# Patient Record
Sex: Male | Born: 1995
Health system: Southern US, Community
[De-identification: ages and names within clinical notes are randomized; demographics above are authoritative.]

## PROBLEM LIST (undated history)

## (undated) ENCOUNTER — Emergency Department (HOSPITAL_COMMUNITY): Admission: EM | Payer: Managed Care, Other (non HMO) | Source: Home / Self Care

## (undated) DIAGNOSIS — C6290 Malignant neoplasm of unspecified testis, unspecified whether descended or undescended: Secondary | ICD-10-CM

## (undated) DIAGNOSIS — C801 Malignant (primary) neoplasm, unspecified: Secondary | ICD-10-CM

## (undated) DIAGNOSIS — Z95828 Presence of other vascular implants and grafts: Secondary | ICD-10-CM

## (undated) DIAGNOSIS — Z800 Family history of malignant neoplasm of digestive organs: Secondary | ICD-10-CM

## (undated) DIAGNOSIS — I10 Essential (primary) hypertension: Secondary | ICD-10-CM

## (undated) DIAGNOSIS — I82409 Acute embolism and thrombosis of unspecified deep veins of unspecified lower extremity: Secondary | ICD-10-CM

## (undated) DIAGNOSIS — C7802 Secondary malignant neoplasm of left lung: Secondary | ICD-10-CM

## (undated) DIAGNOSIS — N5082 Scrotal pain: Secondary | ICD-10-CM

## (undated) DIAGNOSIS — D649 Anemia, unspecified: Secondary | ICD-10-CM

## (undated) DIAGNOSIS — C6292 Malignant neoplasm of left testis, unspecified whether descended or undescended: Secondary | ICD-10-CM

## (undated) DIAGNOSIS — Z803 Family history of malignant neoplasm of breast: Secondary | ICD-10-CM

## (undated) DIAGNOSIS — R011 Cardiac murmur, unspecified: Secondary | ICD-10-CM

## (undated) HISTORY — PX: ORCHIECTOMY: SHX2116

## (undated) HISTORY — DX: Family history of malignant neoplasm of digestive organs: Z80.0

## (undated) HISTORY — DX: Malignant neoplasm of left testis, unspecified whether descended or undescended: C62.92

## (undated) HISTORY — PX: NO PAST SURGERIES: SHX2092

## (undated) HISTORY — DX: Family history of malignant neoplasm of breast: Z80.3

## (undated) HISTORY — DX: Secondary malignant neoplasm of left lung: C78.02

## (undated) HISTORY — DX: Malignant (primary) neoplasm, unspecified: C80.1

---

## 2015-01-01 DIAGNOSIS — M25512 Pain in left shoulder: Secondary | ICD-10-CM | POA: Insufficient documentation

## 2015-01-01 DIAGNOSIS — S46912A Strain of unspecified muscle, fascia and tendon at shoulder and upper arm level, left arm, initial encounter: Secondary | ICD-10-CM | POA: Insufficient documentation

## 2017-07-01 ENCOUNTER — Emergency Department (HOSPITAL_BASED_OUTPATIENT_CLINIC_OR_DEPARTMENT_OTHER): Payer: Managed Care, Other (non HMO)

## 2017-07-01 ENCOUNTER — Emergency Department (HOSPITAL_BASED_OUTPATIENT_CLINIC_OR_DEPARTMENT_OTHER)
Admission: EM | Admit: 2017-07-01 | Discharge: 2017-07-01 | Disposition: A | Discharge status: Home / Self Care | Payer: Managed Care, Other (non HMO) | Attending: Physician Assistant | Admitting: Physician Assistant

## 2017-07-01 ENCOUNTER — Other Ambulatory Visit: Payer: Self-pay

## 2017-07-01 ENCOUNTER — Encounter (HOSPITAL_BASED_OUTPATIENT_CLINIC_OR_DEPARTMENT_OTHER): Payer: Self-pay | Admitting: Emergency Medicine

## 2017-07-01 ENCOUNTER — Encounter (HOSPITAL_BASED_OUTPATIENT_CLINIC_OR_DEPARTMENT_OTHER): Payer: Self-pay | Admitting: *Deleted

## 2017-07-01 ENCOUNTER — Other Ambulatory Visit: Payer: Self-pay | Admitting: Urology

## 2017-07-01 DIAGNOSIS — Y33XXXA Other specified events, undetermined intent, initial encounter: Secondary | ICD-10-CM | POA: Insufficient documentation

## 2017-07-01 DIAGNOSIS — Y939 Activity, unspecified: Secondary | ICD-10-CM | POA: Diagnosis not present

## 2017-07-01 DIAGNOSIS — Y929 Unspecified place or not applicable: Secondary | ICD-10-CM | POA: Diagnosis not present

## 2017-07-01 DIAGNOSIS — Y999 Unspecified external cause status: Secondary | ICD-10-CM | POA: Diagnosis not present

## 2017-07-01 DIAGNOSIS — S3994XA Unspecified injury of external genitals, initial encounter: Secondary | ICD-10-CM | POA: Diagnosis present

## 2017-07-01 DIAGNOSIS — S3022XA Contusion of scrotum and testes, initial encounter: Secondary | ICD-10-CM | POA: Diagnosis not present

## 2017-07-01 LAB — URINALYSIS, ROUTINE W REFLEX MICROSCOPIC
Bilirubin Urine: NEGATIVE
GLUCOSE, UA: NEGATIVE mg/dL
HGB URINE DIPSTICK: NEGATIVE
Ketones, ur: NEGATIVE mg/dL
LEUKOCYTES UA: NEGATIVE
Nitrite: NEGATIVE
PH: 6 (ref 5.0–8.0)
Protein, ur: NEGATIVE mg/dL
Specific Gravity, Urine: 1.025 (ref 1.005–1.030)

## 2017-07-01 MED ORDER — OXYCODONE-ACETAMINOPHEN 5-325 MG PO TABS
1.0000 | ORAL_TABLET | Freq: Once | ORAL | Status: AC
  Administered 2017-07-01: 1 via ORAL
  Filled 2017-07-01: qty 1

## 2017-07-01 MED ORDER — OXYCODONE-ACETAMINOPHEN 5-325 MG PO TABS: 1.0000 | ORAL_TABLET | Freq: Four times a day (QID) | ORAL | 0 refills | Status: DC | PRN

## 2017-07-01 MED FILL — OXYCODONE-ACETAMINOPHEN 5-3: 5-325 | 3 days supply | Qty: 5 | Fill #0

## 2017-07-01 NOTE — Discharge Instructions (Signed)
You have a hematoma that was found.  [facility] reports that they will call you in the next 24 hours to schedule an appointment for check in/procedure.  Please contact them if you are not contacted.  We have given you some medication to help with discomfort until they are able to see you.

## 2017-07-01 NOTE — ED Triage Notes (Signed)
L testicle pain and swelling x 2 weeks. Pt seen by urology Monday and was now has appt for [place] but states pain is getting worse.

## 2017-07-01 NOTE — Progress Notes (Signed)
SPOKE W/ PT VIA PHONE FOR PRE-OP INTERVIEW.  NPO AFTER MN.  ARRIVE AT 3552.  NEEDS HG.  MAY TAKE OXYCODONE IF NEEDED AM ODS W/ SIPS OF WATER.

## 2017-07-01 NOTE — ED Notes (Signed)
Patient transported to Ultrasound 

## 2017-07-01 NOTE — ED Provider Notes (Signed)
[facility] EMERGENCY DEPARTMENT Provider Note   CSN: [id number] Arrival date & time: 07/01/17  4401     History   Chief Complaint Chief Complaint  Patient presents with  . Testicle Pain    HPI Daniel Shepherd is a 22 y.o. male.  HPI   Patient is a 22 year old male presenting with left testicular pain.  Patient was kicked in a kickboxing tournament in September.  He initially had pain had an ultrasound which was normal and sent home from the emergency department.  Patient since been developing increased pain in the left hand side.  Went to urologist on Monday.  He was scheduled for a ultrasound on the 29th of this month and promised that likely at that time that he would have a procedure done to relieve the pain.  Pain jhas noted patient induration and hardness to his left testicle testicle.  It is unchanged from Monday but he is concerned so came here for further evaluation.  She has no difficulty urination.  No fevers.  History reviewed. No pertinent past medical history.  There are no active problems to display for this patient.   History reviewed. No pertinent surgical history.     Home Medications    Prior to Admission medications   Not on File    Family History No family history on file.  Social History Social History   Tobacco Use  . Smoking status: Never Smoker  . Smokeless tobacco: Never Used  Substance Use Topics  . Alcohol use: Yes  . Drug use: No     Allergies   Patient has no known allergies.   Review of Systems Review of Systems  Constitutional: Negative for activity change.  Respiratory: Negative for shortness of breath.   Cardiovascular: Negative for chest pain.  Gastrointestinal: Negative for abdominal pain.  Genitourinary: Positive for testicular pain.  All other systems reviewed and are negative.    Physical Exam Updated Vital Signs BP (!) 164/105 (BP Location: Right Arm)   Pulse 64   Temp 98.1 F (36.7 C) (Oral)    Resp 20   Ht 6\' 1"  (1.854 m)   Wt 83.9 kg (185 lb)   SpO2 99%   BMI 24.41 kg/m   Physical Exam  Constitutional: He is oriented to person, place, and time. He appears well-nourished.  HENT:  Head: Normocephalic.  Eyes: Conjunctivae are normal.  Cardiovascular: Normal rate and regular rhythm.  No murmur heard. Pulmonary/Chest: Effort normal and breath sounds normal. No respiratory distress.  Genitourinary:  Genitourinary Comments: Left testicle swollen, hard.  Neurological: He is oriented to person, place, and time.  Skin: Skin is warm and dry. He is not diaphoretic.  Psychiatric: He has a normal mood and affect. His behavior is normal.     ED Treatments / Results  Labs (all labs ordered are listed, but only abnormal results are displayed) Labs Reviewed  URINALYSIS, ROUTINE W REFLEX MICROSCOPIC    EKG  EKG Interpretation None       Radiology No results found.  Procedures Procedures (including critical care time)  Medications Ordered in ED Medications - No data to display   Initial Impression / Assessment and Plan / ED Course  I have reviewed the triage vital signs and the nursing notes.  Pertinent labs & imaging results that were available during my care of the patient were reviewed by me and considered in my medical decision making (see chart for details).      Patient is a 22 year old  male presenting with left testicular pain.  Patient was kicked in a kickboxing tournament in September.  He initially had pain had an ultrasound which was normal and sent home from the emergency department.  Patient since been developing increased pain in the left hand side.  Went to urologist on Monday.  He was scheduled for a ultrasound on the 29th of this month and promised that likely at that time that he would have a procedure done to relieve the pain.  Pain jhas noted patient induration and hardness to his left testicle testicle.  It is unchanged from Monday but he is  concerned so came here for further evaluation.  10:09 AM Ultrasound is reassuring.  She has the same thing that it did prior.  This since patient reports that no actual change in the appearance and feeling that this is just becoming increasingly uncomfortable.  Discussed with urologist on call.  He reports that [facility] will call the patient and schedule appointment in the next 24 hours.  Either this afternoon or tomorrow.  Final Clinical Impressions(s) / ED Diagnoses   Final diagnoses:  None    ED Discharge Orders    None       Mackuen, Fredia Sorrow, MD 07/01/17 1010

## 2017-07-02 NOTE — H&P (Signed)
HPI: Daniel Shepherd is a 22 year-old male with a tense, painful left hydrocele.  He first noticed the pain approximately 02/14/2017. The pain is on the left side. He does have a history of scrotal trauma.   The pain is constant. The pain is dull. The intensity of his pain is rated as a 10. Walking, sitting , and lifting makes the pain worse. Patient denies exiting the car, coughing, standing, and twisting. Not moving makes the pain better. He was treated with the following pain medication(s): Ibuprofen.   He has had a scrotal ultrasound for the pain. He has not had blood in his urine recently. He has not had kidney stones. He has not had a urinary tract infection recently.   06/29/17: In 9/18 he was in a kick boxing match and got hit in the groin. He was able to continue on with his match but then developed pain about 48 hr later. He went to the emergency room and he reports a ultrasound was performed with no abnormality having been noted. Now he notes that he has begun to have pain again and it is on the left-hand side and is associated with swelling.  07/01/2017: Worsened pain over the past day or 2.He went to [facility] where repeat ultrasound was performed revealing abnormalities in the lower pole of the left testicle as well as a hydrocele. They called me, as he had increasing pain for advice. He has worked in here for recheck. He has had no fever, chills, no redness on the scrotum.   ALLERGIES: None   MEDICATIONS: Ibuprofen 800 mg tablet 1 tablet PO Daily     GU PSH: None   NON-GU PSH: Inner Ear Surgery Procedure - about 2013    GU PMH: None   NON-GU PMH: None   FAMILY HISTORY: None    Notes: Mother still living  Father still living    SOCIAL HISTORY: Marital Status: Married Preferred Language: English; Ethnicity: Not Hispanic Or Latino; Race: White Current Smoking Status: Patient has never smoked.   Tobacco Use Assessment Completed: Used Tobacco in last 30 days? Does  drink.  Drinks 2 caffeinated drinks per day.    REVIEW OF SYSTEMS:    GU Review Male:   Patient denies frequent urination, hard to postpone urination, burning/ pain with urination, get up at night to urinate, leakage of urine, stream starts and stops, trouble starting your stream, have to strain to urinate , erection problems, and penile pain.  Gastrointestinal (Upper):   Patient reports nausea and vomiting. Patient denies indigestion/ heartburn.  Gastrointestinal (Lower):   Patient reports constipation. Patient denies diarrhea.  Constitutional:   Patient denies weight loss, fever, fatigue, and night sweats.  Skin:   Patient denies skin rash/ lesion and itching.  Eyes:   Patient denies blurred vision and double vision.  Ears/ Nose/ Throat:   Patient denies sore throat and sinus problems.  Hematologic/Lymphatic:   Patient denies swollen glands and easy bruising.  Cardiovascular:   Patient denies leg swelling and chest pains.  Respiratory:   Patient denies cough and shortness of breath.  Endocrine:   Patient denies excessive thirst.  Musculoskeletal:   Patient reports back pain and joint pain.   Neurological:   Patient denies headaches and dizziness.  Psychologic:   Patient denies depression and anxiety.   VITAL SIGNS:    Weight 185 lb / 83.91 kg  Height 73 in / 185.42 cm  BP 140/95 mmHg  Pulse 98 /min  BMI 24.4 kg/m   GU PHYSICAL EXAMINATION:    Scrotum: No lesions. No edema. No cysts. No warts.  Epididymides: Right: no spermatocele, no masses, no cysts, no tenderness, no induration, no enlargement. Left: no spermatocele, no masses, no cysts, no tenderness, no induration, no enlargement.  Testes: 5+ cm hydrocele left testis. The fluid in the left testicle does transilluminate. No tenderness, no swelling, no enlargement left testis. No tenderness, no swelling, no enlargement right testis. Normal location left testis. Normal location right testis. No mass, no cyst, no varicocele left  testis. No mass, no cyst, no varicocele, no hydrocele right testis.   Urethral Meatus: Normal size. No lesion, no wart, no discharge, no polyp. Normal location.  Penis: Circumcised, no warts, no cracks. No dorsal Peyronie's plaques, no left corporal Peyronie's plaques, no right corporal Peyronie's plaques, no scarring, no warts. No balanitis, no meatal stenosis.   MULTI-SYSTEM PHYSICAL EXAMINATION:    Constitutional: Well-nourished. No physical deformities. Normally developed. Good grooming.  Neck: Neck symmetrical, not swollen. Normal tracheal position.  Respiratory: No labored breathing, no use of accessory muscles.   Cardiovascular: Normal temperature, normal extremity pulses, no swelling, no varicosities.  Lymphatic: No enlargement of neck, axillae, groin.  Skin: No paleness, no jaundice, no cyanosis. No lesion, no ulcer, no rash.  Neurologic / Psychiatric: Oriented to time, oriented to place, oriented to person. No depression, no anxiety, no agitation.  Gastrointestinal: No mass, no tenderness, no rigidity, non obese abdomen.  Eyes: Normal conjunctivae. Normal eyelids.  Ears, Nose, Mouth, and Throat: Left ear no scars, no lesions, no masses. Right ear no scars, no lesions, no masses. Nose no scars, no lesions, no masses. Normal hearing. Normal lips.  Musculoskeletal: Normal gait and station of head and neck.     PAST DATA REVIEWED:  Source Of History:  Patient   PROCEDURES:          Urinalysis w/Scope Dipstick Dipstick Cont'd Micro  Color: Amber Bilirubin: Neg WBC/hpf: 0 - 5/hpf  Appearance: Clear Ketones: 1+ RBC/hpf: 3 - 10/hpf  Specific Gravity: >1.030 Blood: Neg Bacteria: Rare (0-9/hpf)  pH: 6.0 Protein: 2+ Cystals: NS (Not Seen)  Glucose: Neg Urobilinogen: 0.2 Casts: NS (Not Seen)    Nitrites: Neg Trichomonas: Not Present    Leukocyte Esterase: Neg Mucous: Present      Epithelial Cells: 0 - 5/hpf      Yeast: NS (Not Seen)      Sperm: Not Present    ASSESSMENT/PLAN:       ICD-10 Details  1 GU:   Hydrocele - 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 I have reassured him that he does not have a hernia which was 1 of his concerns. We did discuss briefly the fact that a hydrocele can be treated with aspiration and with surgery and we discussed surgery briefly today. I will await his follow-up scrotal ultrasound to assess the anatomy before deciding on surgery.  2   Left testicular pain - N50.812 Left, His left testicular pain and swelling is secondary to what appears to be a very tense left hydrocele.          Notes:   He is going to return for a scrotal ultrasound and then discuss management options.  He underwent a scrotal ultrasound which revealed, what was described by the radiologist as a slight abnormality in the inferior pole of the left testicle which was improved from his previous scrotal ultrasound and was most  consistent with resolving hematoma however it was mentioned that neoplasm could not be ruled out.  Due to that it was felt that an inguinal approach should be undertaken with evaluation intraoperatively.  The possibility of orchiectomy was discussed due to the possibility of the presence of malignancy.  Tumor markers were sent but have not returned at this time.

## 2017-07-03 ENCOUNTER — Encounter (HOSPITAL_BASED_OUTPATIENT_CLINIC_OR_DEPARTMENT_OTHER)
Admission: RE | Disposition: A | Discharge status: Home / Self Care | Payer: Self-pay | Source: Ambulatory Visit | Attending: Urology

## 2017-07-03 ENCOUNTER — Encounter (HOSPITAL_BASED_OUTPATIENT_CLINIC_OR_DEPARTMENT_OTHER): Payer: Self-pay

## 2017-07-03 ENCOUNTER — Ambulatory Visit (HOSPITAL_BASED_OUTPATIENT_CLINIC_OR_DEPARTMENT_OTHER): Payer: Managed Care, Other (non HMO) | Admitting: Certified Registered Nurse Anesthetist

## 2017-07-03 ENCOUNTER — Ambulatory Visit (HOSPITAL_BASED_OUTPATIENT_CLINIC_OR_DEPARTMENT_OTHER)
Admission: RE | Admit: 2017-07-03 | Discharge: 2017-07-03 | Disposition: A | Discharge status: Home / Self Care | Payer: Managed Care, Other (non HMO) | Source: Ambulatory Visit | Attending: Urology | Admitting: Urology

## 2017-07-03 DIAGNOSIS — N433 Hydrocele, unspecified: Secondary | ICD-10-CM | POA: Diagnosis not present

## 2017-07-03 DIAGNOSIS — C6212 Malignant neoplasm of descended left testis: Secondary | ICD-10-CM

## 2017-07-03 DIAGNOSIS — C6292 Malignant neoplasm of left testis, unspecified whether descended or undescended: Secondary | ICD-10-CM | POA: Insufficient documentation

## 2017-07-03 DIAGNOSIS — C6291 Malignant neoplasm of right testis, unspecified whether descended or undescended: Secondary | ICD-10-CM | POA: Diagnosis not present

## 2017-07-03 HISTORY — DX: Scrotal pain: N50.82

## 2017-07-03 HISTORY — PX: HYDROCELE EXCISION: SHX482

## 2017-07-03 LAB — POCT HEMOGLOBIN-HEMACUE: Hemoglobin: 13.5 g/dL (ref 13.0–17.0)

## 2017-07-03 SURGERY — HYDROCELECTOMY
Anesthesia: General | Site: Scrotum | Laterality: Left

## 2017-07-03 MED ORDER — LIDOCAINE 2% (20 MG/ML) 5 ML SYRINGE
INTRAMUSCULAR | Status: AC
Start: 2017-07-03 — End: ?
  Filled 2017-07-03: qty 5

## 2017-07-03 MED ORDER — HYDROCODONE-ACETAMINOPHEN 7.5-325 MG PO TABS
1.0000 | ORAL_TABLET | Freq: Once | ORAL | Status: AC | PRN
  Administered 2017-07-03: 1 via ORAL
  Filled 2017-07-03: qty 1

## 2017-07-03 MED ORDER — LACTATED RINGERS IV SOLN
INTRAVENOUS | Status: DC
  Administered 2017-07-03 (×3): via INTRAVENOUS
  Filled 2017-07-03: qty 1000

## 2017-07-03 MED ORDER — CEFAZOLIN SODIUM-DEXTROSE 2-4 GM/100ML-% IV SOLN
INTRAVENOUS | Status: AC
  Filled 2017-07-03: qty 100

## 2017-07-03 MED ORDER — HYDROMORPHONE HCL 1 MG/ML IJ SOLN
0.2500 mg | INTRAMUSCULAR | Status: DC | PRN
  Administered 2017-07-03 (×2): 0.5 mg via INTRAVENOUS
  Filled 2017-07-03: qty 0.5

## 2017-07-03 MED ORDER — ONDANSETRON HCL 4 MG/2ML IJ SOLN
INTRAMUSCULAR | Status: AC
  Filled 2017-07-03: qty 2

## 2017-07-03 MED ORDER — ACETAMINOPHEN 500 MG PO TABS
ORAL_TABLET | ORAL | Status: AC
  Filled 2017-07-03: qty 1

## 2017-07-03 MED ORDER — BUPIVACAINE-EPINEPHRINE 0.5% -1:200000 IJ SOLN
INTRAMUSCULAR | Status: DC | PRN
  Administered 2017-07-03: 21 mL

## 2017-07-03 MED ORDER — FENTANYL CITRATE (PF) 100 MCG/2ML IJ SOLN
INTRAMUSCULAR | Status: AC
  Filled 2017-07-03: qty 2

## 2017-07-03 MED ORDER — OXYCODONE HCL 10 MG PO TABS: 10.0000 mg | ORAL_TABLET | ORAL | 0 refills | Status: DC | PRN

## 2017-07-03 MED ORDER — PROPOFOL 10 MG/ML IV BOLUS
INTRAVENOUS | Status: DC | PRN
  Administered 2017-07-03: 200 mg via INTRAVENOUS

## 2017-07-03 MED ORDER — PROMETHAZINE HCL 25 MG/ML IJ SOLN
6.2500 mg | INTRAMUSCULAR | Status: DC | PRN
  Filled 2017-07-03: qty 1

## 2017-07-03 MED ORDER — KETOROLAC TROMETHAMINE 30 MG/ML IJ SOLN
30.0000 mg | Freq: Once | INTRAMUSCULAR | Status: DC | PRN
  Filled 2017-07-03: qty 1

## 2017-07-03 MED ORDER — MIDAZOLAM HCL 2 MG/2ML IJ SOLN
INTRAMUSCULAR | Status: AC
  Filled 2017-07-03: qty 2

## 2017-07-03 MED ORDER — LIDOCAINE 2% (20 MG/ML) 5 ML SYRINGE
INTRAMUSCULAR | Status: DC | PRN
  Administered 2017-07-03: 80 mg via INTRAVENOUS

## 2017-07-03 MED ORDER — PROPOFOL 10 MG/ML IV BOLUS
INTRAVENOUS | Status: AC
  Filled 2017-07-03: qty 40

## 2017-07-03 MED ORDER — HYDROCODONE-ACETAMINOPHEN 7.5-325 MG PO TABS
ORAL_TABLET | ORAL | Status: AC
  Filled 2017-07-03: qty 1

## 2017-07-03 MED ORDER — HYDROMORPHONE HCL 1 MG/ML IJ SOLN
INTRAMUSCULAR | Status: AC
  Filled 2017-07-03: qty 1

## 2017-07-03 MED ORDER — FENTANYL CITRATE (PF) 100 MCG/2ML IJ SOLN
INTRAMUSCULAR | Status: DC | PRN
  Administered 2017-07-03: 25 ug via INTRAVENOUS
  Administered 2017-07-03: 50 ug via INTRAVENOUS
  Administered 2017-07-03: 25 ug via INTRAVENOUS
  Administered 2017-07-03 (×2): 50 ug via INTRAVENOUS

## 2017-07-03 MED ORDER — DEXAMETHASONE SODIUM PHOSPHATE 10 MG/ML IJ SOLN
INTRAMUSCULAR | Status: AC
  Filled 2017-07-03: qty 1

## 2017-07-03 MED ORDER — CEFAZOLIN SODIUM-DEXTROSE 2-4 GM/100ML-% IV SOLN
2.0000 g | Freq: Once | INTRAVENOUS | Status: AC
  Administered 2017-07-03: 2 g via INTRAVENOUS
  Filled 2017-07-03: qty 100

## 2017-07-03 MED ORDER — MIDAZOLAM HCL 5 MG/5ML IJ SOLN
INTRAMUSCULAR | Status: DC | PRN
  Administered 2017-07-03: 2 mg via INTRAVENOUS

## 2017-07-03 MED ORDER — ONDANSETRON HCL 4 MG/2ML IJ SOLN
INTRAMUSCULAR | Status: DC | PRN
  Administered 2017-07-03: 4 mg via INTRAVENOUS

## 2017-07-03 MED ORDER — DEXAMETHASONE SODIUM PHOSPHATE 10 MG/ML IJ SOLN
INTRAMUSCULAR | Status: DC | PRN
  Administered 2017-07-03: 10 mg via INTRAVENOUS

## 2017-07-03 SURGICAL SUPPLY — 50 items
ATTRACTOMAT 16X20 MAGNETIC DRP (DRAPES) ×2 IMPLANT
BLADE CLIPPER SURG (BLADE) IMPLANT
BLADE SURG 15 STRL LF DISP TIS (BLADE) ×1 IMPLANT
BLADE SURG 15 STRL SS (BLADE) ×1
BNDG GAUZE ELAST 4 BULKY (GAUZE/BANDAGES/DRESSINGS) ×2 IMPLANT
CANISTER SUCT 3000ML PPV (MISCELLANEOUS) IMPLANT
CANISTER SUCTION 1200CC (MISCELLANEOUS) IMPLANT
CLEANER CAUTERY TIP 5X5 PAD (MISCELLANEOUS) ×1 IMPLANT
COVER BACK TABLE 60X90IN (DRAPES) ×2 IMPLANT
COVER MAYO STAND STRL (DRAPES) ×2 IMPLANT
DERMABOND ADVANCED (GAUZE/BANDAGES/DRESSINGS) ×1
DERMABOND ADVANCED .7 DNX12 (GAUZE/BANDAGES/DRESSINGS) ×1 IMPLANT
DISSECTOR ROUND CHERRY 3/8 STR (MISCELLANEOUS) ×2 IMPLANT
DRAIN PENROSE 18X1/4 LTX STRL (WOUND CARE) ×2 IMPLANT
DRAPE LAPAROTOMY 100X72 PEDS (DRAPES) ×2 IMPLANT
ELECT REM PT RETURN 9FT ADLT (ELECTROSURGICAL) ×2
ELECTRODE REM PT RTRN 9FT ADLT (ELECTROSURGICAL) ×1 IMPLANT
GAUZE SPONGE 4X4 12PLY STRL LF (GAUZE/BANDAGES/DRESSINGS) ×2 IMPLANT
GAUZE SPONGE 4X4 16PLY XRAY LF (GAUZE/BANDAGES/DRESSINGS) ×2 IMPLANT
GLOVE BIO SURGEON STRL SZ8 (GLOVE) ×2 IMPLANT
GOWN STRL REUS W/ TWL LRG LVL3 (GOWN DISPOSABLE) ×1 IMPLANT
GOWN STRL REUS W/ TWL XL LVL3 (GOWN DISPOSABLE) ×1 IMPLANT
GOWN STRL REUS W/TWL LRG LVL3 (GOWN DISPOSABLE) ×1
GOWN STRL REUS W/TWL XL LVL3 (GOWN DISPOSABLE) ×1
KIT RM TURNOVER CYSTO AR (KITS) ×2 IMPLANT
NEEDLE HYPO 25X1 1.5 SAFETY (NEEDLE) IMPLANT
NS IRRIG 500ML POUR BTL (IV SOLUTION) ×2 IMPLANT
PACK BASIN DAY SURGERY FS (CUSTOM PROCEDURE TRAY) ×2 IMPLANT
PAD CLEANER CAUTERY TIP 5X5 (MISCELLANEOUS) ×1
PENCIL BUTTON HOLSTER BLD 10FT (ELECTRODE) ×2 IMPLANT
SUPPORT SCROTAL LG STRP (MISCELLANEOUS) ×2 IMPLANT
SUT CHROMIC 3 0 SH 27 (SUTURE) ×4 IMPLANT
SUT CHROMIC 5 0 RB 1 27 (SUTURE) ×2 IMPLANT
SUT ETHILON 3 0 PS 1 (SUTURE) IMPLANT
SUT MNCRL AB 4-0 PS2 18 (SUTURE) ×2 IMPLANT
SUT SILK 0 SH 30 (SUTURE) ×2 IMPLANT
SUT SILK 4 0 TIES 17X18 (SUTURE) IMPLANT
SUT VIC AB 1-0 CT2 27 (SUTURE) ×2 IMPLANT
SUT VIC AB 3-0 SH 27 (SUTURE) ×1
SUT VIC AB 3-0 SH 27X BRD (SUTURE) ×1 IMPLANT
SUT VIC AB 4-0 RB1 27 (SUTURE)
SUT VIC AB 4-0 RB1 27X BRD (SUTURE) IMPLANT
SUT VICRYL 6 0 RB 1 (SUTURE) IMPLANT
SYR CONTROL 10ML LL (SYRINGE) ×2 IMPLANT
TOWEL OR 17X24 6PK STRL BLUE (TOWEL DISPOSABLE) ×4 IMPLANT
TRAY DSU PREP LF (CUSTOM PROCEDURE TRAY) ×2 IMPLANT
TUBE CONNECTING 12X1/4 (SUCTIONS) ×2 IMPLANT
WATER STERILE IRR 3000ML UROMA (IV SOLUTION) IMPLANT
WATER STERILE IRR 500ML POUR (IV SOLUTION) ×2 IMPLANT
YANKAUER SUCT BULB TIP NO VENT (SUCTIONS) ×2 IMPLANT

## 2017-07-03 NOTE — Anesthesia Preprocedure Evaluation (Signed)
Anesthesia Evaluation  Patient identified by MRN, date of birth, ID band Patient awake    Reviewed: Allergy & Precautions, NPO status , Patient's Chart, lab work & pertinent test results  Airway Mallampati: II  TM Distance: >3 FB Neck ROM: Full    Dental  (+) Dental Advisory Given   Pulmonary neg pulmonary ROS,    breath sounds clear to auscultation       Cardiovascular negative cardio ROS   Rhythm:Regular Rate:Normal     Neuro/Psych negative neurological ROS     GI/Hepatic negative GI ROS, Neg liver ROS,   Endo/Other  negative endocrine ROS  Renal/GU negative Renal ROS     Musculoskeletal   Abdominal   Peds  Hematology negative hematology ROS (+)   Anesthesia Other Findings   Reproductive/Obstetrics                             Anesthesia Physical Anesthesia Plan  ASA: I  Anesthesia Plan: General   Post-op Pain Management:    Induction: Intravenous  PONV Risk Score and Plan: 3 and Ondansetron, Dexamethasone, Midazolam and Treatment may vary due to age or medical condition  Airway Management Planned: LMA  Additional Equipment:   Intra-op Plan:   Post-operative Plan: Extubation in OR  Informed Consent: I have reviewed the patients History and Physical, chart, labs and discussed the procedure including the risks, benefits and alternatives for the proposed anesthesia with the patient or authorized representative who has indicated his/her understanding and acceptance.   Dental advisory given  Plan Discussed with: CRNA  Anesthesia Plan Comments:         Anesthesia Quick Evaluation

## 2017-07-03 NOTE — Transfer of Care (Signed)
Immediate Anesthesia Transfer of Care Note  Patient: Daniel Shepherd  Procedure(s) Performed: HYDROCELECTOMY ADULT/ INGUINAL APPROACH/ TESTICULAR BIOPSY/ ORCHIECTOMY (Left Scrotum)  Patient Location: PACU  Anesthesia Type:General  Level of Consciousness: awake, alert  and oriented  Airway & Oxygen Therapy: Patient Spontanous Breathing and Patient connected to face mask oxygen  Post-op Assessment: Report given to RN and Post -op Vital signs reviewed and stable  Post vital signs: Reviewed and stable  Last Vitals:  Vitals:   07/03/17 0719  BP: (!) 156/98  Pulse: (!) 106  Resp: 16  Temp: 37.1 C  SpO2: 99%    Last Pain:  Vitals:   07/03/17 0737  TempSrc:   PainSc: 8       Patients Stated Pain Goal: 6 (16/24/46 9507)  Complications: No apparent anesthesia complications

## 2017-07-03 NOTE — Anesthesia Procedure Notes (Signed)
Procedure Name: LMA Insertion Date/Time: 07/03/2017 9:08 AM Performed by: Genelle Bal, CRNA Pre-anesthesia Checklist: Patient identified, Emergency Drugs available, Suction available and Patient being monitored Patient Re-evaluated:Patient Re-evaluated prior to induction Oxygen Delivery Method: Circle system utilized Preoxygenation: Pre-oxygenation with 100% oxygen Induction Type: IV induction Ventilation: Mask ventilation without difficulty LMA: LMA inserted LMA Size: 4.0 Number of attempts: 1 Airway Equipment and Method: Bite block Placement Confirmation: positive ETCO2 Tube secured with: Tape Dental Injury: Teeth and Oropharynx as per pre-operative assessment

## 2017-07-03 NOTE — Discharge Instructions (Signed)
Post Anesthesia Home Care Instructions  Activity: Get plenty of rest for the remainder of the day. A responsible individual must stay with you for 24 hours following the procedure.  For the next 24 hours, DO NOT: -Drive a car -Paediatric nurse -Drink alcoholic beverages -Take any medication unless instructed by your physician -Make any legal decisions or sign important papers.  Meals: Start with liquid foods such as gelatin or soup. Progress to regular foods as tolerated. Avoid greasy, spicy, heavy foods. If nausea and/or vomiting occur, drink only clear liquids until the nausea and/or vomiting subsides. Call your physician if vomiting continues.  Special Instructions/Symptoms: Your throat may feel dry or sore from the anesthesia or the breathing tube placed in your throat during surgery. If this causes discomfort, gargle with warm salt water. The discomfort should disappear within 24 hours.  If you had a scopolamine patch placed behind your ear for the management of post- operative nausea and/or vomiting:  1. The medication in the patch is effective for 72 hours, after which it should be removed.  Wrap patch in a tissue and discard in the trash. Wash hands thoroughly with soap and water. 2. You may remove the patch earlier than 72 hours if you experience unpleasant side effects which may include dry mouth, dizziness or visual disturbances. 3. Avoid touching the patch. Wash your hands with soap and water after contact with the patch.   Scrotal surgery postoperative instructions  Wound:  In most cases your incision will have absorbable sutures that will dissolve within the first 10-20 days. Some will fall out even earlier. Expect some redness as the sutures dissolved but this should occur only around the sutures. If there is generalized redness, especially with increasing pain or swelling, let us know. The scrotum will very likely get "black and blue" as the blood in the tissues spread.  Sometimes the whole scrotum will turn colors. The black and blue is followed by a yellow and brown color. In time, all the discoloration will go away. In some cases some firm swelling in the area of the testicle may persist for up to 4-6 weeks after the surgery and is considered normal in most cases.  Diet:  You may return to your normal diet within 24 hours following your surgery. You may note some mild nausea and possibly vomiting the first 6-8 hours following surgery. This is usually due to the side effects of anesthesia, and will disappear quite soon. I would suggest clear liquids and a very light meal the first evening following your surgery.  Activity:  Your physical activity should be restricted the first 48 hours. During that time you should remain relatively inactive, moving about only when necessary. During the first 7-10 days following surgery he should avoid lifting any heavy objects (anything greater than 15 pounds), and avoid strenuous exercise. If you work, ask [place] specifically about your restrictions, both for work and home. We will write a note to your employer if needed.  You should plan to wear a tight pair of jockey shorts or an athletic supporter for the first 4-5 days, even to sleep. This will keep the scrotum immobilized to some degree and keep the swelling down.  Ice packs should be placed on and off over the scrotum for the first 48 hours. Frozen peas or corn in a ZipLock bag can be frozen, used and re-frozen. Fifteen minutes on and 15 minutes off is a reasonable schedule. The ice is a good pain reliever and keeps  the swelling down.  Hygiene:  You may shower 48 hours after your surgery. Tub bathing should be restricted until the seventh day.  Medication:  You will be sent home with some type of pain medication. In many cases you will be sent home with a narcotic pain pill (hydrococone or oxycodone). If the pain is not too bad, you may take either Tylenol (acetaminophen) or  Advil (ibuprofen) which contain no narcotic agents, and might be tolerated a little better, with fewer side effects. If the pain medication you are sent home with does not control the pain, you will have to let us know. Some narcotic pain medications cannot be given or refilled by a phone call to a pharmacy.  Problems you should report to [place]:   Fever of 101.0 degrees Fahrenheit or greater.  Moderate or severe swelling under the skin incision or involving the scrotum.  Drug reaction such as hives, a rash, nausea or vomiting.    Post Anesthesia Home Care Instructions  Activity: Get plenty of rest for the remainder of the day. A responsible individual must stay with you for 24 hours following the procedure.  For the next 24 hours, DO NOT: -Drive a car -Paediatric nurse -Drink alcoholic beverages -Take any medication unless instructed by your physician -Make any legal decisions or sign important papers.  Meals: Start with liquid foods such as gelatin or soup. Progress to regular foods as tolerated. Avoid greasy, spicy, heavy foods. If nausea and/or vomiting occur, drink only clear liquids until the nausea and/or vomiting subsides. Call your physician if vomiting continues.  Special Instructions/Symptoms: Your throat may feel dry or sore from the anesthesia or the breathing tube placed in your throat during surgery. If this causes discomfort, gargle with warm salt water. The discomfort should disappear within 24 hours.  If you had a scopolamine patch placed behind your ear for the management of post- operative nausea and/or vomiting:  1. The medication in the patch is effective for 72 hours, after which it should be removed.  Wrap patch in a tissue and discard in the trash. Wash hands thoroughly with soap and water. 2. You may remove the patch earlier than 72 hours if you experience unpleasant side effects which may include dry mouth, dizziness or visual disturbances. 3. Avoid touching  the patch. Wash your hands with soap and water after contact with the patch.
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
## 2017-07-06 ENCOUNTER — Other Ambulatory Visit: Payer: Self-pay

## 2017-07-06 ENCOUNTER — Emergency Department (HOSPITAL_BASED_OUTPATIENT_CLINIC_OR_DEPARTMENT_OTHER)
Admission: EM | Admit: 2017-07-06 | Discharge: 2017-07-07 | Disposition: A | Discharge status: Home / Self Care | Payer: Managed Care, Other (non HMO) | Attending: Emergency Medicine | Admitting: Emergency Medicine

## 2017-07-06 ENCOUNTER — Encounter (HOSPITAL_BASED_OUTPATIENT_CLINIC_OR_DEPARTMENT_OTHER): Payer: Self-pay | Admitting: Urology

## 2017-07-06 DIAGNOSIS — R0789 Other chest pain: Secondary | ICD-10-CM | POA: Diagnosis not present

## 2017-07-06 DIAGNOSIS — R0602 Shortness of breath: Secondary | ICD-10-CM | POA: Diagnosis not present

## 2017-07-06 DIAGNOSIS — C6212 Malignant neoplasm of descended left testis: Secondary | ICD-10-CM

## 2017-07-06 DIAGNOSIS — R509 Fever, unspecified: Secondary | ICD-10-CM | POA: Diagnosis present

## 2017-07-06 DIAGNOSIS — C7801 Secondary malignant neoplasm of right lung: Secondary | ICD-10-CM | POA: Diagnosis not present

## 2017-07-06 DIAGNOSIS — N5082 Scrotal pain: Secondary | ICD-10-CM | POA: Insufficient documentation

## 2017-07-06 DIAGNOSIS — C7802 Secondary malignant neoplasm of left lung: Secondary | ICD-10-CM

## 2017-07-06 HISTORY — DX: Malignant neoplasm of unspecified testis, unspecified whether descended or undescended: C62.90

## 2017-07-06 NOTE — ED Triage Notes (Signed)
Pt c/o fever x 1 day, post op x 4 days

## 2017-07-06 NOTE — ED Provider Notes (Signed)
[facility] Provider Note: Georgena Spurling, MD, FACEP  CSN: [id number] MRN: [id number] ARRIVAL: 07/06/17 at 2320 ROOM: MH04/MH04   CHIEF COMPLAINT  Fever   HISTORY OF PRESENT ILLNESS  07/06/17 11:50 PM Daniel Shepherd is a 22 y.o. male who is 3 days status post left orchiectomy for testicular cancer.  He developed a fever today about 7 PM.  It went as high as 101 and so he came to the ED to have this evaluated.  He took an oxycodone and ibuprofen prior to arrival with improvement in his fever.  He has also had some discomfort at the incision site in his left groin which he describes as a pulling or muscle tightness sensation.  He states the pain is negligible at the present time.  He is also having some pleuritic pain in his left lower chest, worse with deep breathing or coughing.  He feels somewhat short of breath and reticent to take a deep breath.  He has not noticed any urinary changes.    Past Medical History:  Diagnosis Date  . Scrotum pain   . Testicular cancer [facility])     Past Surgical History:  Procedure Laterality Date  . HYDROCELE EXCISION Left 07/03/2017   Procedure: HYDROCELECTOMY ADULT/ INGUINAL APPROACH/ TESTICULAR BIOPSY/ ORCHIECTOMY;  Surgeon: Kathie Rhodes, MD;  Location: [place];  Service: Urology;  Laterality: Left;  . NO PAST SURGERIES    . ORCHIECTOMY      History reviewed. No pertinent family history.  Social History   Tobacco Use  . Smoking status: Never Smoker  . Smokeless tobacco: Never Used  Substance Use Topics  . Alcohol use: Yes    Comment: seldom  . Drug use: No    Prior to Admission medications   Medication Sig Start Date End Date Taking? Authorizing Provider  ibuprofen (ADVIL,MOTRIN) 400 MG tablet Take 400 mg by mouth every 6 (six) hours as needed.   Yes [provider]  Oxycodone HCl 10 MG TABS Take 1 tablet (10 mg total) by mouth every 4 (four) hours as needed. 07/03/17  Yes Kathie Rhodes, MD    acetaminophen (TYLENOL) 500 MG tablet Take 500 mg by mouth every 6 (six) hours as needed.    [provider]  oxyCODONE-acetaminophen (PERCOCET/ROXICET) 5-325 MG tablet Take 1 tablet by mouth every 6 (six) hours as needed for severe pain. 07/01/17   Mackuen, Fredia Sorrow, MD    Allergies Patient has no known allergies.   REVIEW OF SYSTEMS  Negative except as noted here or in the History of Present Illness.   PHYSICAL EXAMINATION  Initial Vital Signs Blood pressure (!) 156/92, pulse 88, temperature 99.7 F (37.6 C), temperature source Oral, resp. rate 18, height 6\' 1"  (1.854 m), weight 80.7 kg (178 lb), SpO2 100 %.  Examination General: Well-developed, well-nourished male in no acute distress; appearance consistent with age of record HENT: normocephalic; atraumatic; pharynx normal Eyes: pupils equal, round and reactive to light; extraocular muscles intact Neck: supple Heart: regular rate and rhythm Lungs: Decreased air movement bilaterally Abdomen: soft; nondistended; nontender; no masses or hepatosplenomegaly; bowel sounds present GU: Tanner V male, circumcised; surgical absence of left testicle; well-healing incision left groin with no erythema, warmth, significant tenderness or drainage Extremities: No deformity; full range of motion; pulses normal Neurologic: Awake, alert and oriented; motor function intact in all extremities and symmetric; no facial droop Skin: Warm and dry Psychiatric: Normal mood and affect   RESULTS  Summary of this visit's  results, reviewed by myself:   EKG Interpretation  Date/Time:  Monday July 06 2017 23:46:49 EST Ventricular Rate:  73 PR Interval:    QRS Duration: 99 QT Interval:  364 QTC Calculation: 402 R Axis:   89 Text Interpretation:  Sinus rhythm Normal ECG No previous ECGs available Confirmed by Laquasia Pincus [phone]) on 07/06/2017 11:49:14 PM      Laboratory Studies: Results for orders placed or performed during the  hospital encounter of 07/06/17 (from the past 24 hour(s))  CBC with Differential/Platelet     Status: Abnormal   Collection Time: 07/07/17 12:10 AM  Result Value Ref Range   WBC 10.3 4.0 - 10.5 K/uL   RBC 4.27 4.22 - 5.81 MIL/uL   Hemoglobin 13.1 13.0 - 17.0 g/dL   HCT 38.4 (L) 39.0 - 52.0 %   MCV 89.9 78.0 - 100.0 fL   MCH 30.7 26.0 - 34.0 pg   MCHC 34.1 30.0 - 36.0 g/dL   RDW 11.4 (L) 11.5 - 15.5 %   Platelets 256 150 - 400 K/uL   Neutrophils Relative % 76 %   Neutro Abs 7.8 (H) 1.7 - 7.7 K/uL   Lymphocytes Relative 13 %   Lymphs Abs 1.4 0.7 - 4.0 K/uL   Monocytes Relative 8 %   Monocytes Absolute 0.8 0.1 - 1.0 K/uL   Eosinophils Relative 3 %   Eosinophils Absolute 0.3 0.0 - 0.7 K/uL   Basophils Relative 0 %   Basophils Absolute 0.0 0.0 - 0.1 K/uL  Basic metabolic panel     Status: Abnormal   Collection Time: 07/07/17 12:10 AM  Result Value Ref Range   Sodium 132 (L) 135 - 145 mmol/L   Potassium 3.9 3.5 - 5.1 mmol/L   Chloride 97 (L) 101 - 111 mmol/L   CO2 26 22 - 32 mmol/L   Glucose, Bld 115 (H) 65 - 99 mg/dL   BUN 12 6 - 20 mg/dL   Creatinine, Ser 0.75 0.61 - 1.24 mg/dL   Calcium 8.7 (L) 8.9 - 10.3 mg/dL   GFR calc non Af Amer >60 >60 mL/min   GFR calc Af Amer >60 >60 mL/min   Anion gap 9 5 - 15  Urinalysis, Routine w reflex microscopic     Status: None   Collection Time: 07/07/17  1:30 AM  Result Value Ref Range   Color, Urine YELLOW YELLOW   APPearance CLEAR CLEAR   Specific Gravity, Urine 1.010 1.005 - 1.030   pH 7.0 5.0 - 8.0   Glucose, UA NEGATIVE NEGATIVE mg/dL   Hgb urine dipstick NEGATIVE NEGATIVE   Bilirubin Urine NEGATIVE NEGATIVE   Ketones, ur NEGATIVE NEGATIVE mg/dL   Protein, ur NEGATIVE NEGATIVE mg/dL   Nitrite NEGATIVE NEGATIVE   Leukocytes, UA NEGATIVE NEGATIVE   Imaging Studies: Dg Chest 2 View  Result Date: 07/07/2017 CLINICAL DATA:  Initial evaluation for acute chest pain, fever. History of testicular cancer. EXAM: CHEST  2 VIEW  COMPARISON:  None available. FINDINGS: Cardiac silhouette within normal limits. Asymmetric fullness noted at the left hilum, suggestive of adenopathy. Mediastinal silhouette within normal limits. Lungs normally inflated. Innumerable bilateral pulmonary nodules seen involving the mid and lower lungs, most likely reflecting metastatic disease related to testicular carcinoma. No focal infiltrates. No pulmonary edema or pleural effusion. No pneumothorax No acute osseous abnormality. Lungs are well inflated. IMPRESSION: 1. Innumerable bilateral pulmonary nodules, most consistent with pulmonary metastases related to history of testicular carcinoma. 2. Asymmetric fullness at the left hilum, most likely  reflecting hilar adenopathy. 3. No other active cardiopulmonary disease. Electronically Signed   By: Jeannine Boga M.D.   On: 07/07/2017 00:34    ED COURSE  Nursing notes and initial vitals signs, including pulse oximetry, reviewed.  Vitals:   07/06/17 2326 07/07/17 0024 07/07/17 0150  BP: (!) 156/92  (!) 163/92  Pulse: 88  97  Resp: 18  20  Temp: 99.7 F (37.6 C)    TempSrc: Oral    SpO2: 100% 98% 100%  Weight: 80.7 kg (178 lb)    Height: 6\' 1"  (1.854 m)     1:51 AM And mother advised of chest x-ray findings consistent with metastatic testicular cancer.  This was discussed with Dr. Benay Spice of hematology oncology.  He will arranged to get the patient into see Dr. Marin Olp later today for further workup and to get him started on chemotherapy.  As the patient surgical site appears free of infection the fevers are likely due to his cancer.  He was advised to take ibuprofen or Tylenol for fever.  PROCEDURES    ED DIAGNOSES     ICD-10-CM   1. Malignant neoplasm of descended left testis (HCC) C62.12   2. 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 Malignant neoplasm metastatic to right lung ([place]) C78.01        Vidit Boissonneault, Jenny Reichmann, MD 07/07/17 [phone]

## 2017-07-06 NOTE — ED Notes (Signed)
ED Provider at bedside. 

## 2017-07-07 ENCOUNTER — Emergency Department (HOSPITAL_BASED_OUTPATIENT_CLINIC_OR_DEPARTMENT_OTHER): Payer: Managed Care, Other (non HMO)

## 2017-07-07 ENCOUNTER — Other Ambulatory Visit: Payer: Self-pay | Admitting: Student

## 2017-07-07 ENCOUNTER — Other Ambulatory Visit: Payer: Self-pay | Admitting: Family

## 2017-07-07 ENCOUNTER — Inpatient Hospital Stay: Payer: Managed Care, Other (non HMO) | Attending: Hematology & Oncology | Admitting: Hematology & Oncology

## 2017-07-07 ENCOUNTER — Telehealth: Payer: Self-pay | Admitting: *Deleted

## 2017-07-07 ENCOUNTER — Encounter: Payer: Self-pay | Admitting: Hematology & Oncology

## 2017-07-07 ENCOUNTER — Inpatient Hospital Stay: Payer: Managed Care, Other (non HMO)

## 2017-07-07 ENCOUNTER — Encounter (HOSPITAL_BASED_OUTPATIENT_CLINIC_OR_DEPARTMENT_OTHER): Payer: Self-pay | Admitting: Emergency Medicine

## 2017-07-07 ENCOUNTER — Other Ambulatory Visit: Payer: Managed Care, Other (non HMO)

## 2017-07-07 ENCOUNTER — Encounter: Payer: Self-pay | Admitting: *Deleted

## 2017-07-07 VITALS — BP 168/98 | HR 77 | Temp 98.3°F | Resp 18 | Wt 175.0 lb

## 2017-07-07 DIAGNOSIS — C801 Malignant (primary) neoplasm, unspecified: Secondary | ICD-10-CM

## 2017-07-07 DIAGNOSIS — C6212 Malignant neoplasm of descended left testis: Secondary | ICD-10-CM

## 2017-07-07 DIAGNOSIS — C6292 Malignant neoplasm of left testis, unspecified whether descended or undescended: Secondary | ICD-10-CM

## 2017-07-07 DIAGNOSIS — C7800 Secondary malignant neoplasm of unspecified lung: Secondary | ICD-10-CM | POA: Diagnosis not present

## 2017-07-07 DIAGNOSIS — Z5111 Encounter for antineoplastic chemotherapy: Secondary | ICD-10-CM | POA: Diagnosis not present

## 2017-07-07 DIAGNOSIS — C7802 Secondary malignant neoplasm of left lung: Secondary | ICD-10-CM | POA: Insufficient documentation

## 2017-07-07 HISTORY — DX: Malignant (primary) neoplasm, unspecified: C80.1

## 2017-07-07 HISTORY — DX: Secondary malignant neoplasm of left lung: C78.02

## 2017-07-07 HISTORY — DX: Malignant neoplasm of left testis, unspecified whether descended or undescended: C62.92

## 2017-07-07 LAB — CBC WITH DIFFERENTIAL/PLATELET
BASOS PCT: 0 %
Basophils Absolute: 0 10*3/uL (ref 0.0–0.1)
EOS ABS: 0.3 10*3/uL (ref 0.0–0.7)
EOS PCT: 3 %
HCT: 38.4 % — ABNORMAL LOW (ref 39.0–52.0)
Hemoglobin: 13.1 g/dL (ref 13.0–17.0)
Lymphocytes Relative: 13 %
Lymphs Abs: 1.4 10*3/uL (ref 0.7–4.0)
MCH: 30.7 pg (ref 26.0–34.0)
MCHC: 34.1 g/dL (ref 30.0–36.0)
MCV: 89.9 fL (ref 78.0–100.0)
MONO ABS: 0.8 10*3/uL (ref 0.1–1.0)
Monocytes Relative: 8 %
Neutro Abs: 7.8 10*3/uL — ABNORMAL HIGH (ref 1.7–7.7)
Neutrophils Relative %: 76 %
PLATELETS: 256 10*3/uL (ref 150–400)
RBC: 4.27 MIL/uL (ref 4.22–5.81)
RDW: 11.4 % — AB (ref 11.5–15.5)
WBC: 10.3 10*3/uL (ref 4.0–10.5)

## 2017-07-07 LAB — HEPATIC FUNCTION PANEL
ALBUMIN: 3.5 g/dL (ref 3.5–5.0)
ALT: 12 U/L — ABNORMAL LOW (ref 17–63)
AST: 21 U/L (ref 15–41)
Alkaline Phosphatase: 80 U/L (ref 38–126)
BILIRUBIN DIRECT: 0.2 mg/dL (ref 0.1–0.5)
BILIRUBIN TOTAL: 0.6 mg/dL (ref 0.3–1.2)
Indirect Bilirubin: 0.4 mg/dL (ref 0.3–0.9)
Total Protein: 7.5 g/dL (ref 6.5–8.1)

## 2017-07-07 LAB — URINALYSIS, ROUTINE W REFLEX MICROSCOPIC
Bilirubin Urine: NEGATIVE
GLUCOSE, UA: NEGATIVE mg/dL
Hgb urine dipstick: NEGATIVE
KETONES UR: NEGATIVE mg/dL
Leukocytes, UA: NEGATIVE
NITRITE: NEGATIVE
PROTEIN: NEGATIVE mg/dL
Specific Gravity, Urine: 1.01 (ref 1.005–1.030)
pH: 7 (ref 5.0–8.0)

## 2017-07-07 LAB — CBC WITH DIFFERENTIAL (CANCER CENTER ONLY)
BASOS ABS: 0 10*3/uL (ref 0.0–0.1)
BASOS PCT: 0 %
Eosinophils Absolute: 0.1 10*3/uL (ref 0.0–0.5)
Eosinophils Relative: 1 %
HCT: 42.3 % (ref 38.7–49.9)
HEMOGLOBIN: 14.4 g/dL (ref 13.0–17.1)
LYMPHS ABS: 1.4 10*3/uL (ref 0.9–3.3)
Lymphocytes Relative: 13 %
MCH: 31 pg (ref 28.0–33.4)
MCHC: 34 g/dL (ref 32.0–35.9)
MCV: 91.2 fL (ref 82.0–98.0)
Monocytes Absolute: 0.7 10*3/uL (ref 0.1–0.9)
Monocytes Relative: 6 %
NEUTROS PCT: 80 %
Neutro Abs: 8.6 10*3/uL — ABNORMAL HIGH (ref 1.5–6.5)
Platelet Count: 310 10*3/uL (ref 140–400)
RBC: 4.64 MIL/uL (ref 4.20–5.70)
RDW: 11.4 % (ref 11.1–15.7)
WBC: 10.8 10*3/uL — AB (ref 4.0–10.3)

## 2017-07-07 LAB — BASIC METABOLIC PANEL
ANION GAP: 9 (ref 5–15)
BUN: 12 mg/dL (ref 6–20)
CALCIUM: 8.7 mg/dL — AB (ref 8.9–10.3)
CO2: 26 mmol/L (ref 22–32)
Chloride: 97 mmol/L — ABNORMAL LOW (ref 101–111)
Creatinine, Ser: 0.75 mg/dL (ref 0.61–1.24)
GLUCOSE: 115 mg/dL — AB (ref 65–99)
Potassium: 3.9 mmol/L (ref 3.5–5.1)
Sodium: 132 mmol/L — ABNORMAL LOW (ref 135–145)

## 2017-07-07 LAB — CMP (CANCER CENTER ONLY)
ALBUMIN: 3.9 g/dL (ref 3.5–5.0)
ALK PHOS: 88 U/L — AB (ref 26–84)
ALT: 12 U/L (ref 0–55)
AST: 23 U/L (ref 5–34)
Anion gap: 10 (ref 5–15)
BUN: 10 mg/dL (ref 7–22)
CO2: 31 mmol/L (ref 18–33)
CREATININE: 0.9 mg/dL (ref 0.70–1.30)
Calcium: 9.8 mg/dL (ref 8.0–10.3)
Chloride: 99 mmol/L (ref 98–108)
Glucose, Bld: 113 mg/dL (ref 70–118)
Potassium: 5.6 mmol/L — ABNORMAL HIGH (ref 3.3–4.7)
SODIUM: 140 mmol/L (ref 128–145)
TOTAL PROTEIN: 8.5 g/dL — AB (ref 6.4–8.1)
Total Bilirubin: 1.1 mg/dL (ref 0.2–1.2)

## 2017-07-07 LAB — LACTATE DEHYDROGENASE
LDH: 583 U/L — ABNORMAL HIGH (ref 98–192)
LDH: 857 U/L — AB (ref 125–245)

## 2017-07-07 MED ORDER — IPRATROPIUM-ALBUTEROL 0.5-2.5 (3) MG/3ML IN SOLN
3.0000 mL | Freq: Once | RESPIRATORY_TRACT | Status: AC
  Administered 2017-07-07: 3 mL via RESPIRATORY_TRACT
  Filled 2017-07-07: qty 3

## 2017-07-07 MED ORDER — LORAZEPAM 1 MG PO TABS: 1.0000 mg | ORAL_TABLET | Freq: Three times a day (TID) | ORAL | 0 refills | Status: DC | PRN

## 2017-07-07 MED ORDER — ALBUTEROL SULFATE HFA 108 (90 BASE) MCG/ACT IN AERS
2.0000 | INHALATION_SPRAY | RESPIRATORY_TRACT | Status: DC | PRN
  Administered 2017-07-07: 2 via RESPIRATORY_TRACT
  Filled 2017-07-07: qty 6.7

## 2017-07-07 MED ORDER — LORAZEPAM 1 MG PO TABS
1.0000 mg | ORAL_TABLET | Freq: Once | ORAL | Status: AC
Start: 2017-07-07 — End: 2017-07-07
  Administered 2017-07-07: 1 mg via ORAL
  Filled 2017-07-07: qty 1

## 2017-07-07 NOTE — Anesthesia Postprocedure Evaluation (Signed)
Anesthesia Post Note  Patient: Daniel Shepherd  Procedure(s) Performed: HYDROCELECTOMY ADULT/ INGUINAL APPROACH/ TESTICULAR BIOPSY/ ORCHIECTOMY (Left Scrotum)     Patient location during evaluation: PACU Anesthesia Type: General Level of consciousness: awake and alert Pain management: pain level controlled Vital Signs Assessment: post-procedure vital signs reviewed and stable Respiratory status: spontaneous breathing, nonlabored ventilation, respiratory function stable and patient connected to nasal cannula oxygen Cardiovascular status: blood pressure returned to baseline and stable Postop Assessment: no apparent nausea or vomiting Anesthetic complications: no    Last Vitals:  Vitals:   07/03/17 1200 07/03/17 1515  BP: (!) 155/96 (!) 150/95  Pulse: 81 83  Resp: 12 16  Temp:  37.1 C  SpO2: 96% 99%    Last Pain:  Vitals:   07/06/17 1329  TempSrc:   PainSc: 0-No pain                 Tiajuana Amass

## 2017-07-07 NOTE — Progress Notes (Signed)
START OFF PATHWAY REGIMEN - Testicular   [id number]:Ifosfamide 1200 mg/m2 D1-5 + Etoposide 75 mg/m2 D1-5 + Cisplatin 20 mg/m2 D1-5 ([facility]) q21 Days:   A cycle is every 21 days:     Cisplatin      [place]      Ifosfamide      Etoposide      [id number]-xxxx   **Always confirm dose/schedule in your pharmacy ordering system**    Patient Characteristics: Nonseminoma, Newly Diagnosed, Stage III, Intermediate Risk AJCC T Category: pT2 AJCC N Category: pN2 AJCC M Category: M1a AJCC S Category Post-Orchiectomy: SX AJCC 8 Stage Grouping: Unknown Current evidence of distant metastases<= Yes Histology: Nonseminoma Would you be surprised if this patient died  in the next year<= I would be surprised if this patient died in the next year Risk Status: Intermediate Risk Intent of Therapy: Curative Intent, Discussed with Patient

## 2017-07-07 NOTE — Telephone Encounter (Signed)
No show for chemo class.  Left message to reschedule.

## 2017-07-07 NOTE — ED Notes (Signed)
Patient transported to X-ray 

## 2017-07-08 ENCOUNTER — Other Ambulatory Visit: Payer: Self-pay | Admitting: Radiology

## 2017-07-08 ENCOUNTER — Telehealth: Payer: Self-pay | Admitting: *Deleted

## 2017-07-08 ENCOUNTER — Other Ambulatory Visit: Payer: Self-pay | Admitting: *Deleted

## 2017-07-08 ENCOUNTER — Other Ambulatory Visit: Payer: Self-pay | Admitting: General Surgery

## 2017-07-08 LAB — AFP TUMOR MARKER
AFP, Serum, Tumor Marker: 3.7 ng/mL (ref 0.0–8.3)
AFP, Serum, Tumor Marker: 4.1 ng/mL (ref 0.0–8.3)

## 2017-07-08 LAB — BETA HCG QUANT (REF LAB)
HCG QUANT: 367 m[IU]/mL — AB (ref 0–3)
hCG Quant: 465 m[IU]/mL — ABNORMAL HIGH (ref 0–3)

## 2017-07-08 NOTE — Telephone Encounter (Signed)
Discussed with patient over phone about app't for port on 1/24 and the scans on 1/26.  States he could NOT come to a chemo class today.  Will come on Friday 1/25 for the 9:30 class.  Verbalized understanding.

## 2017-07-08 NOTE — Telephone Encounter (Signed)
No additional notes
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
## 2017-07-08 NOTE — Telephone Encounter (Signed)
Wife understood class reschedule for 1/25 at 9:30

## 2017-07-09 ENCOUNTER — Encounter (HOSPITAL_COMMUNITY): Payer: Self-pay

## 2017-07-09 ENCOUNTER — Other Ambulatory Visit: Payer: Self-pay | Admitting: Hematology & Oncology

## 2017-07-09 ENCOUNTER — Ambulatory Visit (HOSPITAL_COMMUNITY)
Admission: RE | Admit: 2017-07-09 | Discharge: 2017-07-09 | Disposition: A | Discharge status: Home / Self Care | Payer: Managed Care, Other (non HMO) | Source: Ambulatory Visit | Attending: Hematology & Oncology | Admitting: Hematology & Oncology

## 2017-07-09 ENCOUNTER — Telehealth: Payer: Self-pay | Admitting: *Deleted

## 2017-07-09 ENCOUNTER — Telehealth: Payer: Self-pay | Admitting: Hematology & Oncology

## 2017-07-09 DIAGNOSIS — C7802 Secondary malignant neoplasm of left lung: Secondary | ICD-10-CM

## 2017-07-09 DIAGNOSIS — C6292 Malignant neoplasm of left testis, unspecified whether descended or undescended: Secondary | ICD-10-CM | POA: Insufficient documentation

## 2017-07-09 DIAGNOSIS — C801 Malignant (primary) neoplasm, unspecified: Principal | ICD-10-CM

## 2017-07-09 HISTORY — PX: IR FLUORO GUIDE PORT INSERTION RIGHT: IMG5741

## 2017-07-09 HISTORY — PX: IR US GUIDE VASC ACCESS RIGHT: IMG2390

## 2017-07-09 LAB — PROTIME-INR
INR: 1.2
PROTHROMBIN TIME: 15.1 s (ref 11.4–15.2)

## 2017-07-09 LAB — CBC
HEMATOCRIT: 38.6 % — AB (ref 39.0–52.0)
HEMOGLOBIN: 13.2 g/dL (ref 13.0–17.0)
MCH: 30.2 pg (ref 26.0–34.0)
MCHC: 34.2 g/dL (ref 30.0–36.0)
MCV: 88.3 fL (ref 78.0–100.0)
Platelets: 316 10*3/uL (ref 150–400)
RBC: 4.37 MIL/uL (ref 4.22–5.81)
RDW: 11.3 % — ABNORMAL LOW (ref 11.5–15.5)
WBC: 10.2 10*3/uL (ref 4.0–10.5)

## 2017-07-09 LAB — APTT: APTT: 41 s — AB (ref 24–36)

## 2017-07-09 MED ORDER — FENTANYL CITRATE (PF) 100 MCG/2ML IJ SOLN
INTRAMUSCULAR | Status: AC | PRN
  Administered 2017-07-09 (×3): 50 ug via INTRAVENOUS

## 2017-07-09 MED ORDER — SODIUM CHLORIDE 0.9 % IV SOLN
INTRAVENOUS | Status: DC
  Administered 2017-07-09: 10:00:00 via INTRAVENOUS

## 2017-07-09 MED ORDER — FENTANYL CITRATE (PF) 100 MCG/2ML IJ SOLN
INTRAMUSCULAR | Status: AC
  Filled 2017-07-09: qty 2

## 2017-07-09 MED ORDER — MIDAZOLAM HCL 2 MG/2ML IJ SOLN
INTRAMUSCULAR | Status: AC
  Filled 2017-07-09: qty 2

## 2017-07-09 MED ORDER — CEFAZOLIN SODIUM-DEXTROSE 2-4 GM/100ML-% IV SOLN
INTRAVENOUS | Status: AC
  Filled 2017-07-09: qty 100

## 2017-07-09 MED ORDER — MIDAZOLAM HCL 2 MG/2ML IJ SOLN
INTRAMUSCULAR | Status: AC | PRN
  Administered 2017-07-09 (×3): 1 mg via INTRAVENOUS

## 2017-07-09 MED ORDER — LIDOCAINE HCL (PF) 1 % IJ SOLN
INTRAMUSCULAR | Status: AC | PRN
  Administered 2017-07-09: 10 mL

## 2017-07-09 MED ORDER — SULFAMETHOXAZOLE-TRIMETHOPRIM 800-160 MG PO TABS: 1.0000 | ORAL_TABLET | Freq: Two times a day (BID) | ORAL | 0 refills | Status: DC

## 2017-07-09 MED ORDER — HEPARIN SOD (PORK) LOCK FLUSH 100 UNIT/ML IV SOLN
INTRAVENOUS | Status: AC
  Administered 2017-07-09: 500 [IU]
  Filled 2017-07-09: qty 5

## 2017-07-09 MED ORDER — LIDOCAINE-EPINEPHRINE (PF) 1 %-1:200000 IJ SOLN
INTRAMUSCULAR | Status: AC
  Filled 2017-07-09: qty 30

## 2017-07-09 MED ORDER — CEFAZOLIN SODIUM-DEXTROSE 2-4 GM/100ML-% IV SOLN
2.0000 g | INTRAVENOUS | Status: AC
  Administered 2017-07-09: 2 g via INTRAVENOUS

## 2017-07-09 MED ORDER — LIDOCAINE HCL 1 % IJ SOLN
INTRAMUSCULAR | Status: AC
  Filled 2017-07-09: qty 20

## 2017-07-09 MED ORDER — CEFAZOLIN SODIUM-DEXTROSE 2-4 GM/100ML-% IV SOLN
INTRAVENOUS | Status: AC
  Administered 2017-07-09: 2 g via INTRAVENOUS
  Filled 2017-07-09: qty 100

## 2017-07-09 NOTE — Sedation Documentation (Signed)
Patient is resting comfortably. 

## 2017-07-09 NOTE — Telephone Encounter (Signed)
Patient's mother states that the patient had his port placed today. Patient had several complaints and it was suggested that patient may have a kidney infection. They are calling for direction.   Patient has pain in his bladder with pressure. He states the pain is relieved when he urinates, however it is difficult for him to initiate urinating. He denies pain with urination or urine symptoms like cloudiness or odor.   Reviewed symptoms with Dr Marin Olp. He wants a prescription for Bactrim sent.  Patient is aware of new prescription. Pharmacy confirmed.

## 2017-07-09 NOTE — Discharge Instructions (Signed)
Implanted Port Insertion, Care After °This sheet gives you information about how to care for yourself after your procedure. Your health care provider may also give you more specific instructions. If you have problems or questions, contact your health care provider. °What can I expect after the procedure? °After your procedure, it is common to have: °· Discomfort at the port insertion site. °· Bruising on the skin over the port. This should improve over 3-4 days. ° °Follow these instructions at home: °Port care °· After your port is placed, you will get a manufacturer's information card. The card has information about your port. Keep this card with you at all times. °· Take care of the port as told by your health care provider. Ask your health care provider if you or a family member can get training for taking care of the port at home. A home health care nurse may also take care of the port. °· Make sure to remember what type of port you have. °Incision care °· Follow instructions from your health care provider about how to take care of your port insertion site. Make sure you: °? Wash your hands with soap and water before you change your bandage (dressing). If soap and water are not available, use hand sanitizer. °? Change your dressing as told by your health care provider. °? Leave stitches (sutures), skin glue, or adhesive strips in place. These skin closures may need to stay in place for 2 weeks or longer. If adhesive strip edges start to loosen and curl up, you may trim the loose edges. Do not remove adhesive strips completely unless your health care provider tells you to do that. °· Check your port insertion site every day for signs of infection. Check for: °? More redness, swelling, or pain. °? More fluid or blood. °? Warmth. °? Pus or a bad smell. °General instructions °· Do not take baths, swim, or use a hot tub until your health care provider approves. °· Do not lift anything that is heavier than 10 lb (4.5  kg) for a week, or as told by your health care provider. °· Ask your health care provider when it is okay to: °? Return to work or school. °? Resume usual physical activities or sports. °· Do not drive for 24 hours if you were given a medicine to help you relax (sedative). °· Take over-the-counter and prescription medicines only as told by your health care provider. °· Wear a medical alert bracelet in case of an emergency. This will tell any health care providers that you have a port. °· Keep all follow-up visits as told by your health care provider. This is important. °Contact a health care provider if: °· You cannot flush your port with saline as directed, or you cannot draw blood from the port. °· You have a fever or chills. °· You have more redness, swelling, or pain around your port insertion site. °· You have more fluid or blood coming from your port insertion site. °· Your port insertion site feels warm to the touch. °· You have pus or a bad smell coming from the port insertion site. °Get help right away if: °· You have chest pain or shortness of breath. °· You have bleeding from your port that you cannot control. °Summary °· Take care of the port as told by your health care provider. °· Change your dressing as told by your health care provider. °· Keep all follow-up visits as told by your health care provider. °  This information is not intended to replace advice given to you by your health care provider. Make sure you discuss any questions you have with your health care provider. °Document Released: 03/23/2013 Document Revised: 04/23/2016 Document Reviewed: 04/23/2016 °Elsevier Interactive Patient Education © 2017 [employer] ° °

## 2017-07-09 NOTE — H&P (Signed)
Chief Complaint: testicular cancer  Referring Physician:Dr. Burney Gauze  Supervising Physician: Aletta Edouard  Patient Status: [facility] - Out-pt  HPI: Daniel Shepherd is a 22 y.o. male who has been known since last September to have an abnormality of his left testicular, likely hydrocele.  Follow up was recommended.  He has continued to have left testicular pain and presented to the ED last week.  A repeat US was obtained which showed persistent hydrocele, underlying neoplasm could not be excluded.  Subsequently urology was consulted and he underwent orchectomy last Friday.  Unfortunately, this returned as non-seminomatous malignancy.  He then returned to the ED 2 days later with SOB.  He had a CXR which revealed multiple metastatic pulmonary lesions.  He was referred to oncology who has requested PAC placement so he can start chemo next week.  His SOB is better, but still complains of some pain in his bladder.  Past Medical History:  Past Medical History:  Diagnosis Date  . 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 Non-seminomatous testicular cancer, left ([place]) 07/07/2017  . Scrotum pain   . Testicular cancer [facility])     Past Surgical History:  Past Surgical History:  Procedure Laterality Date  . HYDROCELE EXCISION Left 07/03/2017   Procedure: HYDROCELECTOMY ADULT/ INGUINAL APPROACH/ TESTICULAR BIOPSY/ ORCHIECTOMY;  Surgeon: Kathie Rhodes, MD;  Location: [place];  Service: Urology;  Laterality: Left;  . NO PAST SURGERIES    . ORCHIECTOMY      Family History: History reviewed. No pertinent family history.  Social History:  reports that  has never smoked. he has never used smokeless tobacco. He reports that he drinks alcohol. He reports that he does not use drugs.  Allergies: No Known Allergies  Medications: Medications reviewed in epic  Please HPI for pertinent positives, otherwise complete 10 system ROS  negative.  Mallampati Score: MD Evaluation Airway: WNL Heart: WNL Abdomen: WNL Chest/ Lungs: WNL ASA  Classification: 2 Mallampati/Airway Score: One  Physical Exam: BP (!) 170/93 (BP Location: Right Arm)   Pulse 92   Temp 99.1 F (37.3 C) (Oral)   Ht 6' 1"  (1.854 m)   Wt 170 lb (77.1 kg)   SpO2 98%   BMI 22.43 kg/m  Body mass index is 22.43 kg/m. General: pleasant, WD, WN white male who is laying in bed in NAD HEENT: head is normocephalic, atraumatic.  Sclera are noninjected.  PERRL.  Ears and nose without any masses or lesions.  Mouth is pink and moist Heart: regular, rate, and rhythm.  Normal s1,s2. No obvious murmurs, gallops, or rubs noted.  Palpable radial and pedal pulses bilaterally Lungs: CTAB, no wheezes, rhonchi, or rales noted.  Respiratory effort nonlabored Abd: soft, NT, ND, +BS, no masses, hernias, or organomegaly Psych: A&Ox3 with an appropriate affect.   Labs: Results for orders placed or performed during the hospital encounter of 07/09/17 (from the past 48 hour(s))  APTT upon arrival     Status: Abnormal   Collection Time: 07/09/17  7:09 AM  Result Value Ref Range   aPTT 41 (H) 24 - 36 seconds    Comment:        IF BASELINE aPTT IS ELEVATED, SUGGEST PATIENT RISK ASSESSMENT BE USED TO DETERMINE APPROPRIATE ANTICOAGULANT THERAPY.   CBC upon arrival     Status: Abnormal   Collection Time: 07/09/17  7:09 AM  Result Value Ref Range   WBC 10.2 4.0 - 10.5 K/uL  RBC 4.37 4.22 - 5.81 MIL/uL   Hemoglobin 13.2 13.0 - 17.0 g/dL   HCT 38.6 (L) 39.0 - 52.0 %   MCV 88.3 78.0 - 100.0 fL   MCH 30.2 26.0 - 34.0 pg   MCHC 34.2 30.0 - 36.0 g/dL   RDW 11.3 (L) 11.5 - 15.5 %   Platelets 316 150 - 400 K/uL  Protime-INR upon arrival     Status: None   Collection Time: 07/09/17  7:09 AM  Result Value Ref Range   Prothrombin Time 15.1 11.4 - 15.2 seconds   INR 1.20     Imaging: No results found.  Assessment/Plan 1. Metastatic testicular cancer We will proceed  with PAC placement today.  Labs and vitals reviewed. Risks and benefits discussed with the patient including, but not limited to bleeding, infection, pneumothorax, or fibrin sheath development and need for additional procedures. All of the patient's questions were answered, patient is agreeable to proceed. Consent signed and in chart.   Thank you for this interesting consult.  I greatly enjoyed meeting Daniel Shepherd and look forward to participating in their care.  A copy of this report was sent to the requesting provider on this date.  Electronically Signed: Henreitta Cea 07/09/2017, 9:12 AM   I spent a total of  30 Minutes   in face to face in clinical consultation, greater than 50% of which was counseling/coordinating care for met testicular cancer

## 2017-07-09 NOTE — Procedures (Signed)
Interventional Radiology Procedure Note  Procedure: Single Lumen Power Port Placement    Access:  Right IJ vein.  Findings: Catheter tip positioned at SVC/RA junction. Port is ready for immediate use.   Complications: None  EBL: < 10 mL  Recommendations:  - Ok to shower in 24 hours - Do not submerge for 7 days - Routine line care   Peyton Rossner T. Ella Guillotte, M.D Pager:  [phone]   

## 2017-07-10 ENCOUNTER — Encounter (HOSPITAL_BASED_OUTPATIENT_CLINIC_OR_DEPARTMENT_OTHER): Payer: Self-pay | Admitting: *Deleted

## 2017-07-10 ENCOUNTER — Other Ambulatory Visit: Payer: Self-pay | Admitting: *Deleted

## 2017-07-10 ENCOUNTER — Emergency Department (HOSPITAL_BASED_OUTPATIENT_CLINIC_OR_DEPARTMENT_OTHER)
Admission: EM | Admit: 2017-07-10 | Discharge: 2017-07-11 | Disposition: A | Discharge status: Home / Self Care | Payer: Managed Care, Other (non HMO) | Attending: Emergency Medicine | Admitting: Emergency Medicine

## 2017-07-10 ENCOUNTER — Inpatient Hospital Stay: Payer: Managed Care, Other (non HMO) | Attending: Hematology & Oncology

## 2017-07-10 DIAGNOSIS — Z79899 Other long term (current) drug therapy: Secondary | ICD-10-CM | POA: Diagnosis not present

## 2017-07-10 DIAGNOSIS — I10 Essential (primary) hypertension: Secondary | ICD-10-CM | POA: Insufficient documentation

## 2017-07-10 DIAGNOSIS — R509 Fever, unspecified: Secondary | ICD-10-CM | POA: Insufficient documentation

## 2017-07-10 DIAGNOSIS — C801 Malignant (primary) neoplasm, unspecified: Principal | ICD-10-CM

## 2017-07-10 DIAGNOSIS — C7802 Secondary malignant neoplasm of left lung: Secondary | ICD-10-CM

## 2017-07-10 DIAGNOSIS — C7800 Secondary malignant neoplasm of unspecified lung: Secondary | ICD-10-CM | POA: Diagnosis not present

## 2017-07-10 DIAGNOSIS — C799 Secondary malignant neoplasm of unspecified site: Secondary | ICD-10-CM

## 2017-07-10 DIAGNOSIS — Z8547 Personal history of malignant neoplasm of testis: Secondary | ICD-10-CM | POA: Insufficient documentation

## 2017-07-10 DIAGNOSIS — R1030 Lower abdominal pain, unspecified: Secondary | ICD-10-CM

## 2017-07-10 DIAGNOSIS — M545 Low back pain: Secondary | ICD-10-CM | POA: Insufficient documentation

## 2017-07-10 HISTORY — DX: Essential (primary) hypertension: I10

## 2017-07-10 LAB — URINALYSIS, ROUTINE W REFLEX MICROSCOPIC
Bilirubin Urine: NEGATIVE
Glucose, UA: NEGATIVE mg/dL
Hgb urine dipstick: NEGATIVE
KETONES UR: 15 mg/dL — AB
LEUKOCYTES UA: NEGATIVE
Nitrite: NEGATIVE
PH: 7.5 (ref 5.0–8.0)
PROTEIN: NEGATIVE mg/dL
Specific Gravity, Urine: 1.01 (ref 1.005–1.030)

## 2017-07-10 MED ORDER — DEXAMETHASONE 4 MG PO TABS: ORAL_TABLET | ORAL | 1 refills | Status: DC

## 2017-07-10 MED ORDER — DEXAMETHASONE 4 MG PO TABS
ORAL_TABLET | ORAL | 1 refills | Status: DC
Start: 2017-07-10 — End: 2017-07-10

## 2017-07-10 MED ORDER — PROCHLORPERAZINE MALEATE 10 MG PO TABS: 10.0000 mg | ORAL_TABLET | Freq: Four times a day (QID) | ORAL | 1 refills | Status: DC | PRN

## 2017-07-10 MED ORDER — MORPHINE SULFATE (PF) 4 MG/ML IV SOLN
8.0000 mg | Freq: Once | INTRAVENOUS | Status: AC
  Administered 2017-07-11: 8 mg via INTRAVENOUS
  Filled 2017-07-10: qty 2

## 2017-07-10 MED ORDER — SODIUM CHLORIDE 0.9 % IV BOLUS (SEPSIS)
1000.0000 mL | Freq: Once | INTRAVENOUS | Status: AC
  Administered 2017-07-11: 1000 mL via INTRAVENOUS

## 2017-07-10 MED ORDER — LORAZEPAM 0.5 MG PO TABS
0.5000 mg | ORAL_TABLET | Freq: Four times a day (QID) | ORAL | 0 refills | Status: DC | PRN
Start: 2017-07-10 — End: 2018-01-05

## 2017-07-10 MED ORDER — LIDOCAINE-PRILOCAINE 2.5-2.5 % EX CREA: TOPICAL_CREAM | CUTANEOUS | 3 refills | Status: DC

## 2017-07-10 MED ORDER — LORAZEPAM 0.5 MG PO TABS: 0.5000 mg | ORAL_TABLET | Freq: Four times a day (QID) | ORAL | 0 refills | Status: DC | PRN

## 2017-07-10 MED ORDER — ONDANSETRON HCL 8 MG PO TABS: 8.0000 mg | ORAL_TABLET | Freq: Two times a day (BID) | ORAL | 1 refills | Status: DC | PRN

## 2017-07-10 NOTE — ED Notes (Signed)
Unable to provide urine sample at this time

## 2017-07-10 NOTE — Progress Notes (Unsigned)
Attended class and was given information on Cisplatin, Etoposide and IFEX which will start on Monday, January 28th

## 2017-07-10 NOTE — ED Triage Notes (Signed)
Pt reports he had testicular surgery on Friday for testicular cancer. Starts chemo next week. Has had lower abdominal pain since the surgery. Called oncology tonight, who sent him here for further eval. LBM today. No urinary discomfort. No nausea, vomiting. Pt thinks he may have had some fevers. Taking tylenol and ibuprofen for pain.

## 2017-07-11 ENCOUNTER — Ambulatory Visit (HOSPITAL_BASED_OUTPATIENT_CLINIC_OR_DEPARTMENT_OTHER): Payer: Managed Care, Other (non HMO)

## 2017-07-11 ENCOUNTER — Emergency Department (HOSPITAL_BASED_OUTPATIENT_CLINIC_OR_DEPARTMENT_OTHER): Payer: Managed Care, Other (non HMO)

## 2017-07-11 ENCOUNTER — Ambulatory Visit (HOSPITAL_BASED_OUTPATIENT_CLINIC_OR_DEPARTMENT_OTHER)
Admission: RE | Admit: 2017-07-11 | Discharge: 2017-07-11 | Disposition: A | Discharge status: Home / Self Care | Payer: Managed Care, Other (non HMO) | Source: Ambulatory Visit | Attending: Hematology & Oncology | Admitting: Hematology & Oncology

## 2017-07-11 ENCOUNTER — Encounter (HOSPITAL_BASED_OUTPATIENT_CLINIC_OR_DEPARTMENT_OTHER): Payer: Self-pay

## 2017-07-11 ENCOUNTER — Ambulatory Visit (HOSPITAL_BASED_OUTPATIENT_CLINIC_OR_DEPARTMENT_OTHER)
Admission: RE | Admit: 2017-07-11 | Discharge: 2017-07-11 | Disposition: A | Discharge status: Home / Self Care | Payer: Managed Care, Other (non HMO) | Source: Ambulatory Visit | Attending: Family | Admitting: Family

## 2017-07-11 DIAGNOSIS — C6292 Malignant neoplasm of left testis, unspecified whether descended or undescended: Secondary | ICD-10-CM | POA: Insufficient documentation

## 2017-07-11 LAB — COMPREHENSIVE METABOLIC PANEL
ALT: 15 U/L — ABNORMAL LOW (ref 17–63)
AST: 23 U/L (ref 15–41)
Albumin: 3.9 g/dL (ref 3.5–5.0)
Alkaline Phosphatase: 84 U/L (ref 38–126)
Anion gap: 11 (ref 5–15)
BILIRUBIN TOTAL: 0.3 mg/dL (ref 0.3–1.2)
BUN: 13 mg/dL (ref 6–20)
CHLORIDE: 99 mmol/L — AB (ref 101–111)
CO2: 22 mmol/L (ref 22–32)
CREATININE: 0.93 mg/dL (ref 0.61–1.24)
Calcium: 9.3 mg/dL (ref 8.9–10.3)
Glucose, Bld: 111 mg/dL — ABNORMAL HIGH (ref 65–99)
POTASSIUM: 3.9 mmol/L (ref 3.5–5.1)
Sodium: 132 mmol/L — ABNORMAL LOW (ref 135–145)
TOTAL PROTEIN: 8.4 g/dL — AB (ref 6.5–8.1)

## 2017-07-11 LAB — CBC WITH DIFFERENTIAL/PLATELET
Basophils Absolute: 0 10*3/uL (ref 0.0–0.1)
Basophils Relative: 0 %
EOS ABS: 0.1 10*3/uL (ref 0.0–0.7)
EOS PCT: 1 %
HCT: 38.9 % — ABNORMAL LOW (ref 39.0–52.0)
Hemoglobin: 13.7 g/dL (ref 13.0–17.0)
LYMPHS ABS: 1.5 10*3/uL (ref 0.7–4.0)
LYMPHS PCT: 15 %
MCH: 30.6 pg (ref 26.0–34.0)
MCHC: 35.2 g/dL (ref 30.0–36.0)
MCV: 87 fL (ref 78.0–100.0)
MONOS PCT: 9 %
Monocytes Absolute: 0.9 10*3/uL (ref 0.1–1.0)
Neutro Abs: 7.5 10*3/uL (ref 1.7–7.7)
Neutrophils Relative %: 75 %
PLATELETS: 373 10*3/uL (ref 150–400)
RBC: 4.47 MIL/uL (ref 4.22–5.81)
RDW: 11.2 % — ABNORMAL LOW (ref 11.5–15.5)
WBC: 10 10*3/uL (ref 4.0–10.5)

## 2017-07-11 LAB — I-STAT CG4 LACTIC ACID, ED: LACTIC ACID, VENOUS: 1 mmol/L (ref 0.5–1.9)

## 2017-07-11 MED ORDER — IOPAMIDOL (ISOVUE-300) INJECTION 61%
100.0000 mL | Freq: Once | INTRAVENOUS | Status: AC | PRN
  Administered 2017-07-11: 100 mL via INTRAVENOUS

## 2017-07-11 MED ORDER — MORPHINE SULFATE (PF) 4 MG/ML IV SOLN
8.0000 mg | Freq: Once | INTRAVENOUS | Status: AC
  Administered 2017-07-11: 8 mg via INTRAVENOUS
  Filled 2017-07-11: qty 2

## 2017-07-11 MED ORDER — OXYCODONE-ACETAMINOPHEN 5-325 MG PO TABS: 1.0000 | ORAL_TABLET | ORAL | 0 refills | Status: DC | PRN

## 2017-07-11 MED ORDER — OXYCODONE-ACETAMINOPHEN 5-325 MG PO TABS
1.0000 | ORAL_TABLET | Freq: Once | ORAL | Status: AC
  Administered 2017-07-11: 1 via ORAL
  Filled 2017-07-11: qty 1

## 2017-07-11 MED ORDER — GADOBENATE DIMEGLUMINE 529 MG/ML IV SOLN
15.0000 mL | Freq: Once | INTRAVENOUS | Status: AC | PRN
  Administered 2017-07-11: 15 mL via INTRAVENOUS

## 2017-07-11 NOTE — ED Provider Notes (Signed)
[facility] EMERGENCY DEPARTMENT Provider Note   CSN: [id number] Arrival date & time: 07/10/17  2054     History   Chief Complaint Chief Complaint  Patient presents with  . Abdominal Pain    HPI Jeydan Barner is a 22 y.o. male.  HPI  22 year old male with a new diagnosis of nonseminomatous testicular cancer status post orchiectomy presents with fever and abdominal pain.  He states he had a fever starting 4 days ago and was 101.  He has not had a temperature that high since.  However he has been consistently taking Tylenol.  He had a cough during the same amount of time and was seen here earlier in the week and told he had metastatic disease.  He has had consistent lower abdominal pain since the fever started.  He has had some trouble starting urinating but denies dysuria or cloudy urine.  Has had a little bit of low back pain as well.  He ran out of the oxycodone but does not want to take it anyway and wants to get off of it.  He has been taking ibuprofen and Tylenol.  He does not feel short of breath.  His wound is sore but not increasing in pain.  He is due to have a CT scan of his chest, abdomen, and pelvis tomorrow and is to start chemotherapy in 3 days.  He has been having some constipation and trouble having bowel movements.  Last bowel movement was yesterday.  He called his oncologist because he has had some difficulty urinating and because of the fever they started him on Bactrim yesterday.  Past Medical History:  Diagnosis Date  . Hypertension    states recent high blood pressure during the last few weeks  . 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 Non-seminomatous testicular cancer, left ([place]) 07/07/2017  . Scrotum pain   . Testicular cancer [facility])     Patient Active Problem List   Diagnosis Date Noted  . Non-seminomatous testicular cancer, left ([place]) 07/07/2017  . Metastatic embryonal carcinoma to lung with unknown  primary site, left [facility]) 07/07/2017    Past Surgical History:  Procedure Laterality Date  . HYDROCELE EXCISION Left 07/03/2017   Procedure: HYDROCELECTOMY ADULT/ INGUINAL APPROACH/ TESTICULAR BIOPSY/ ORCHIECTOMY;  Surgeon: Kathie Rhodes, MD;  Location: [facility];  Service: Urology;  Laterality: Left;  . IR FLUORO GUIDE PORT INSERTION RIGHT  07/09/2017  . IR US GUIDE VASC ACCESS RIGHT  07/09/2017  . NO PAST SURGERIES    . ORCHIECTOMY         Home Medications    Prior to Admission medications   Medication Sig Start Date End Date Taking? Authorizing Provider  acetaminophen (TYLENOL) 500 MG tablet Take 500 mg by mouth every 6 (six) hours as needed.    [provider]  albuterol (PROVENTIL HFA;VENTOLIN HFA) 108 (90 Base) MCG/ACT inhaler Inhale 1-2 puffs into the lungs every 4 (four) hours as needed for wheezing or shortness of breath.    [provider]  dexamethasone (DECADRON) 4 MG tablet Take 2 tablets by mouth once a day on the day after cisplatin chemotherapy and then take 2 tablets 2 times a day for 2 days. Take with food. 07/10/17   [name], MD  ibuprofen (ADVIL,MOTRIN) 400 MG tablet Take 400 mg by mouth every 6 (six) hours as needed.    [provider]  lidocaine-prilocaine (EMLA) cream Apply to affected area  once 07/10/17   [name], MD  LORazepam (ATIVAN) 0.5 MG tablet Take 1 tablet (0.5 mg total) by mouth every 6 (six) hours as needed (Nausea or vomiting). 07/10/17   [name], MD  LORazepam (ATIVAN) 1 MG tablet Take 1 tablet (1 mg total) by mouth every 8 (eight) hours as needed for anxiety. 07/07/17   Molpus, John, MD  ondansetron (ZOFRAN) 8 MG tablet Take 1 tablet (8 mg total) by mouth 2 (two) times daily as needed. Start on the third day after cisplatin chemotherapy. 07/10/17   [name], MD  Oxycodone HCl 10 MG TABS Take 1 tablet (10 mg total) by mouth every 4 (four) hours as needed. Patient not taking: Reported  on 07/08/2017 07/03/17   Kathie Rhodes, MD  oxyCODONE-acetaminophen (PERCOCET) 5-325 MG tablet Take 1-2 tablets by mouth every 4 (four) hours as needed for severe pain. 07/11/17   Sherwood Gambler, MD  prochlorperazine (COMPAZINE) 10 MG tablet Take 1 tablet (10 mg total) by mouth every 6 (six) hours as needed (Nausea or vomiting). 07/10/17   [name], MD  sulfamethoxazole-trimethoprim (BACTRIM DS,SEPTRA DS) 800-160 MG tablet Take 1 tablet by mouth 2 (two) times daily. For 3 days. 07/09/17   [name], MD    Family History No family history on file.  Social History Social History   Tobacco Use  . Smoking status: Never Smoker  . Smokeless tobacco: Never Used  Substance Use Topics  . Alcohol use: Yes    Comment: seldom  . Drug use: No     Allergies   Patient has no known allergies.   Review of Systems Review of Systems  Constitutional: Positive for fever.  Respiratory: Positive for cough. Negative for shortness of breath.   Gastrointestinal: Positive for abdominal pain and constipation. Negative for diarrhea, nausea and vomiting.  Genitourinary: Negative for dysuria.  Musculoskeletal: Positive for back pain.  All other systems reviewed and are negative.    Physical Exam Updated Vital Signs BP (!) 165/91   Pulse 84   Temp 99.4 F (37.4 C) (Oral)   Resp 16   Ht 6\' 1"  (1.854 m)   Wt 77.1 kg (170 lb)   SpO2 99%   BMI 22.43 kg/m   Physical Exam  Constitutional: He is oriented to person, place, and time. He appears well-developed and well-nourished.  Non-toxic appearance. He does not appear ill. No distress.  HENT:  Head: Normocephalic and atraumatic.  Right Ear: External ear normal.  Left Ear: External ear normal.  Nose: Nose normal.  Eyes: Right eye exhibits no discharge. Left eye exhibits no discharge.  Neck: Neck supple.  Cardiovascular: Normal rate, regular rhythm and normal heart sounds.  Pulmonary/Chest: Effort normal and breath sounds normal.    Abdominal: Soft. There is no tenderness. There is no CVA tenderness.    Musculoskeletal: He exhibits no edema.       Thoracic back: He exhibits no tenderness.       Lumbar back: He exhibits no tenderness.  Neurological: He is alert and oriented to person, place, and time.  Skin: Skin is warm and dry.  Nursing note and vitals reviewed.    ED Treatments / Results  Labs (all labs ordered are listed, but only abnormal results are displayed) Labs Reviewed  URINALYSIS, ROUTINE W REFLEX MICROSCOPIC - Abnormal; Notable for the following components:      Result Value   Ketones, ur 15 (*)    All other components within normal limits  CBC WITH DIFFERENTIAL/PLATELET - Abnormal; Notable for the following components:   HCT 38.9 (*)    RDW 11.2 (*)    All other components within normal limits  COMPREHENSIVE METABOLIC PANEL - Abnormal; Notable for the following components:   Sodium 132 (*)    Chloride 99 (*)    Glucose, Bld 111 (*)    Total Protein 8.4 (*)    ALT 15 (*)    All other components within normal limits  CULTURE, BLOOD (ROUTINE X 2)  CULTURE, BLOOD (ROUTINE X 2)  I-STAT CG4 LACTIC ACID, ED    EKG  EKG Interpretation None       Radiology Ct Chest W Contrast  Result Date: 07/11/2017 CLINICAL DATA:  Status post removal of left testicle, now with lower abdominal and lower back pain for 1 day history of testicle cancer EXAM: CT CHEST, ABDOMEN, AND PELVIS WITH CONTRAST TECHNIQUE: Multidetector CT imaging of the chest, abdomen and pelvis was performed following the standard protocol during bolus administration of intravenous contrast. CONTRAST:  174mL ISOVUE-300 IOPAMIDOL (ISOVUE-300) INJECTION 61% COMPARISON:  Ultrasound 07/01/2017, chest x-ray 07/07/2017 FINDINGS: CT CHEST FINDINGS Cardiovascular: Nonaneurysmal aorta. Normal heart size. No pericardial effusion Mediastinum/Nodes: Midline trachea. No thyroid mass. Enlarged mediastinal lymph node, AP window lymph node measures 13  mm, series 2, image number 72. Enlarged bilateral hilar lymph nodes, measuring up to 11 mm on the right and 24 mm on the left. Esophagus within normal limits Lungs/Pleura: No pleural effusion. Innumerable bilateral pulmonary nodules consistent with metastatic disease. Musculoskeletal: No acute or suspicious bone lesion. CT ABDOMEN PELVIS FINDINGS Hepatobiliary: Vague subcentimeter hypodense liver lesions too small to further characterize. No calcified gallstones or biliary dilatation Pancreas: Unremarkable. No pancreatic ductal dilatation or surrounding inflammatory changes. Spleen: Normal in size without focal abnormality. Adrenals/Urinary Tract: Adrenal glands are within normal limits. Kidneys show no hydronephrosis. Stomach/Bowel: Stomach is within normal limits. Appendix not well seen but no right lower quadrant inflammation. No evidence of bowel wall thickening, distention, or inflammatory changes. Vascular/Lymphatic: Nonaneurysmal aorta. Ill-defined soft tissue density in the left periaortic space, consistent with adenopathy. Enlarged left iliac node measuring up to 15 mm. Multiple enlarged left external iliac nodes measuring up to 11 mm. Reproductive: Prostate unremarkable.  Status post left orchiectomy. Other: Negative for free air or free fluid Musculoskeletal: No acute or significant osseous findings. IMPRESSION: 1. Innumerable pulmonary nodules consistent with metastatic disease. 2. Multiple enlarged mediastinal and left greater than right hilar lymph nodes, also consistent with metastatic disease. 3. No CT evidence for acute intra-abdominal or pelvic abnormality 4. Slightly enlarged retroperitoneal and iliac lymph nodes, also concerning for metastatic disease. Electronically Signed   By: Donavan Foil M.D.   On: 07/11/2017 02:25   Ct Abdomen Pelvis W Contrast  Result Date: 07/11/2017 CLINICAL DATA:  Status post removal of left testicle, now with lower abdominal and lower back pain for 1 day history  of testicle cancer EXAM: CT CHEST, ABDOMEN, AND PELVIS WITH CONTRAST TECHNIQUE: Multidetector CT imaging of the chest, abdomen and pelvis was performed following the standard protocol during bolus administration of intravenous contrast. CONTRAST:  138mL ISOVUE-300 IOPAMIDOL (ISOVUE-300) INJECTION 61% COMPARISON:  Ultrasound 07/01/2017, chest x-ray 07/07/2017 FINDINGS: CT CHEST FINDINGS Cardiovascular: Nonaneurysmal aorta. Normal heart size. No pericardial effusion Mediastinum/Nodes: Midline trachea. No thyroid mass. Enlarged mediastinal lymph node, AP window lymph node measures 13 mm, series 2, image number 58. Enlarged bilateral hilar lymph nodes, measuring up to 11 mm on the right and 24 mm on the  left. Esophagus within normal limits Lungs/Pleura: No pleural effusion. Innumerable bilateral pulmonary nodules consistent with metastatic disease. Musculoskeletal: No acute or suspicious bone lesion. CT ABDOMEN PELVIS FINDINGS Hepatobiliary: Vague subcentimeter hypodense liver lesions too small to further characterize. No calcified gallstones or biliary dilatation Pancreas: Unremarkable. No pancreatic ductal dilatation or surrounding inflammatory changes. Spleen: Normal in size without focal abnormality. Adrenals/Urinary Tract: Adrenal glands are within normal limits. Kidneys show no hydronephrosis. Stomach/Bowel: Stomach is within normal limits. Appendix not well seen but no right lower quadrant inflammation. No evidence of bowel wall thickening, distention, or inflammatory changes. Vascular/Lymphatic: Nonaneurysmal aorta. Ill-defined soft tissue density in the left periaortic space, consistent with adenopathy. Enlarged left iliac node measuring up to 15 mm. Multiple enlarged left external iliac nodes measuring up to 11 mm. Reproductive: Prostate unremarkable.  Status post left orchiectomy. Other: Negative for free air or free fluid Musculoskeletal: No acute or significant osseous findings. IMPRESSION: 1. Innumerable  pulmonary nodules consistent with metastatic disease. 2. Multiple enlarged mediastinal and left greater than right hilar lymph nodes, also consistent with metastatic disease. 3. No CT evidence for acute intra-abdominal or pelvic abnormality 4. Slightly enlarged retroperitoneal and iliac lymph nodes, also concerning for metastatic disease. Electronically Signed   By: Donavan Foil M.D.   On: 07/11/2017 02:25   Ir US Guide Vasc Access Right  Result Date: 07/09/2017 CLINICAL DATA:  Metastatic embryonal carcinoma of the left testis to lung. Port-A-Cath required to begin chemotherapy. EXAM: IMPLANTED PORT A CATH PLACEMENT WITH ULTRASOUND AND FLUOROSCOPIC GUIDANCE ANESTHESIA/SEDATION: 3.0 mg IV Versed; 150 mcg IV Fentanyl Total Moderate Sedation Time:  39 minutes The patient's level of consciousness and physiologic status were continuously monitored during the procedure by Radiology nursing. Additional Medications: 2 g IV Ancef. FLUOROSCOPY TIME:  24 seconds.  0.9 mGy. PROCEDURE: The procedure, risks, benefits, and alternatives were explained to the patient. Questions regarding the procedure were encouraged and answered. The patient understands and consents to the procedure. A time-out was performed prior to initiating the procedure. Ultrasound was utilized to confirm patency of the right internal jugular vein. The right neck and chest were prepped with chlorhexidine in a sterile fashion, and a sterile drape was applied covering the operative field. Maximum barrier sterile technique with sterile gowns and gloves were used for the procedure. Local anesthesia was provided with 1% lidocaine. After creating a small venotomy incision, a 21 gauge needle was advanced into the right internal jugular vein under direct, real-time ultrasound guidance. Ultrasound image documentation was performed. After securing guidewire access, an 8 Fr dilator was placed. A J-wire was kinked to measure appropriate catheter length. A  subcutaneous port pocket was then created along the upper chest wall utilizing sharp and blunt dissection. Portable cautery was utilized. The pocket was irrigated with sterile saline. A single lumen power injectable port was chosen for placement. The 8 Fr catheter was tunneled from the port pocket site to the venotomy incision. The port was placed in the pocket. External catheter was trimmed to appropriate length based on guidewire measurement. At the venotomy, an 8 Fr peel-away sheath was placed over a guidewire. The catheter was then placed through the sheath and the sheath removed. Final catheter positioning was confirmed and documented with a fluoroscopic spot image. The port was accessed with a needle and aspirated and flushed with heparinized saline. The access needle was removed. The venotomy and port pocket incisions were closed with subcutaneous 3-0 Monocryl and subcuticular 4-0 Vicryl. Dermabond was applied to both incisions. COMPLICATIONS:  COMPLICATIONS None FINDINGS: After catheter placement, the tip lies at the cavo-atrial junction. The catheter aspirates normally and is ready for immediate use. IMPRESSION: Placement of single lumen port a cath via right internal jugular vein. The catheter tip lies at the cavo-atrial junction. A power injectable port a cath was placed and is ready for immediate use. Electronically Signed   By: Aletta Edouard M.D.   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 Port-A-Cath required to begin chemotherapy. EXAM: IMPLANTED PORT A CATH PLACEMENT WITH ULTRASOUND AND FLUOROSCOPIC GUIDANCE ANESTHESIA/SEDATION: 3.0 mg IV Versed; 150 mcg IV Fentanyl Total Moderate Sedation Time:  39 minutes The patient's level of consciousness and physiologic status were continuously monitored during the procedure by Radiology nursing. Additional Medications: 2 g IV Ancef.  FLUOROSCOPY TIME:  24 seconds.  0.9 mGy. PROCEDURE: The procedure, risks, benefits, and alternatives were explained to the patient. Questions regarding the procedure were encouraged and answered. The patient understands and consents to the procedure. A time-out was performed prior to initiating the procedure. Ultrasound was utilized to confirm patency of the right internal jugular vein. The right neck and chest were prepped with chlorhexidine in a sterile fashion, and a sterile drape was applied covering the operative field. Maximum barrier sterile technique with sterile gowns and gloves were used for the procedure. Local anesthesia was provided with 1% lidocaine. After creating a small venotomy incision, a 21 gauge needle was advanced into the right internal jugular vein under direct, real-time ultrasound guidance. Ultrasound image documentation was performed. After securing guidewire access, an 8 Fr dilator was placed. A J-wire was kinked to measure appropriate catheter length. A subcutaneous port pocket was then created along the upper chest wall utilizing sharp and blunt dissection. Portable cautery was utilized. The pocket was irrigated with sterile saline. A single lumen power injectable port was chosen for placement. The 8 Fr catheter was tunneled from the port pocket site to the venotomy incision. The port was placed in the pocket. External catheter was trimmed to appropriate length based on guidewire measurement. At the venotomy, an 8 Fr peel-away sheath was placed over a guidewire. The catheter was then placed through the sheath and the sheath removed. Final catheter positioning was confirmed and documented with a fluoroscopic spot image. The port was accessed with a needle and aspirated and flushed with heparinized saline. The access needle was removed. The venotomy and port pocket incisions were closed with subcutaneous 3-0 Monocryl and subcuticular 4-0 Vicryl. Dermabond was applied to both incisions.  COMPLICATIONS: COMPLICATIONS None FINDINGS: After catheter placement, the tip lies at the cavo-atrial junction. The catheter aspirates normally and is ready for immediate use. IMPRESSION: Placement of single lumen port a cath via right internal jugular vein. The catheter tip lies at the cavo-atrial junction. A power injectable port a cath was placed and is ready for immediate use. Electronically Signed   By: Aletta Edouard M.D.   On: 07/09/2017 11:34    Procedures Procedures (including critical care time)  Medications Ordered in ED Medications  sodium chloride 0.9 % bolus 1,000 mL (0 mLs Intravenous Stopped 07/11/17 0402)  morphine 4 MG/ML injection 8 mg (8 mg Intravenous Given 07/11/17 0027)  iopamidol (ISOVUE-300) 61 % injection 100 mL (100 mLs Intravenous Contrast Given 07/11/17 0057)  morphine 4 MG/ML injection 8 mg (8 mg Intravenous Given 07/11/17 0232)  oxyCODONE-acetaminophen (PERCOCET/ROXICET) 5-325 MG per tablet  1 tablet (1 tablet Oral Given 07/11/17 0407)     Initial Impression / Assessment and Plan / ED Course  I have reviewed the triage vital signs and the nursing notes.  Pertinent labs & imaging results that were available during my care of the patient were reviewed by me and considered in my medical decision making (see chart for details).     Patient has not had a documented fever since Monday.  However he has also been on Tylenol.  Thus blood cultures were obtained but otherwise his blood work is unremarkable including normal lactate, WBC of 10, and no significant or emergent electrolyte abnormality.  Labs similar to a few days ago when in the ER.  Given no obvious UTI despite his lower abdominal pain and fever at home, CT of abdomen pelvis obtained.  Given he was due for a chest as well tomorrow this was also added on.  These show metastatic disease and lymphadenopathy but no acute emergent cause of abdominal pain.  Unclear exact cause.  He is better with IV treatments in the ED  but at this point does not appear to have an emergent condition.  He is following with his oncologist Dr. Marin Olp in 2 days.  He states he ran out of his Percocet and while he thought he did not need it he is expressing a desire to have a repeat prescription.  He will be given a short course and he and his oncologist will need to discuss further outpatient pain control.  Return precautions.  Final Clinical Impressions(s) / ED Diagnoses   Final diagnoses:  Metastatic cancer ([place])  Lower abdominal pain    ED Discharge Orders        Ordered    oxyCODONE-acetaminophen (PERCOCET) 5-325 MG tablet  Every 4 hours PRN     07/11/17 0354       Sherwood Gambler, MD 07/11/17 [phone]

## 2017-07-13 ENCOUNTER — Inpatient Hospital Stay: Payer: Managed Care, Other (non HMO)

## 2017-07-13 VITALS — BP 147/85 | HR 97 | Temp 98.9°F | Resp 19

## 2017-07-13 DIAGNOSIS — C7802 Secondary malignant neoplasm of left lung: Secondary | ICD-10-CM

## 2017-07-13 DIAGNOSIS — Z5111 Encounter for antineoplastic chemotherapy: Secondary | ICD-10-CM | POA: Diagnosis not present

## 2017-07-13 DIAGNOSIS — C801 Malignant (primary) neoplasm, unspecified: Principal | ICD-10-CM

## 2017-07-13 LAB — CBC WITH DIFFERENTIAL (CANCER CENTER ONLY)
BASOS PCT: 1 %
Basophils Absolute: 0.1 10*3/uL (ref 0.0–0.1)
Eosinophils Absolute: 0.1 10*3/uL (ref 0.0–0.5)
Eosinophils Relative: 1 %
HEMATOCRIT: 36.7 % — AB (ref 38.7–49.9)
HEMOGLOBIN: 12.6 g/dL — AB (ref 13.0–17.1)
Lymphocytes Relative: 20 %
Lymphs Abs: 1.5 10*3/uL (ref 0.9–3.3)
MCH: 31 pg (ref 28.0–33.4)
MCHC: 34.3 g/dL (ref 32.0–35.9)
MCV: 90.4 fL (ref 82.0–98.0)
MONOS PCT: 8 %
Monocytes Absolute: 0.6 10*3/uL (ref 0.1–0.9)
NEUTROS ABS: 5.1 10*3/uL (ref 1.5–6.5)
NEUTROS PCT: 70 %
Platelet Count: 290 10*3/uL (ref 140–400)
RBC: 4.06 MIL/uL — AB (ref 4.20–5.70)
RDW: 11.1 % (ref 11.1–15.7)
WBC: 7.4 10*3/uL (ref 4.0–10.3)

## 2017-07-13 LAB — CMP (CANCER CENTER ONLY)
ALT: 13 U/L (ref 0–55)
ANION GAP: 9 (ref 5–15)
AST: 26 U/L (ref 5–34)
Albumin: 3.3 g/dL — ABNORMAL LOW (ref 3.5–5.0)
Alkaline Phosphatase: 86 U/L — ABNORMAL HIGH (ref 26–84)
BILIRUBIN TOTAL: 0.5 mg/dL (ref 0.2–1.2)
BUN: 17 mg/dL (ref 7–22)
CALCIUM: 9 mg/dL (ref 8.0–10.3)
CHLORIDE: 103 mmol/L (ref 98–108)
CO2: 26 mmol/L (ref 18–33)
Creatinine: 0.8 mg/dL (ref 0.70–1.30)
Glucose, Bld: 142 mg/dL — ABNORMAL HIGH (ref 70–118)
Potassium: 3.6 mmol/L (ref 3.3–4.7)
Sodium: 138 mmol/L (ref 128–145)
Total Protein: 7.3 g/dL (ref 6.4–8.1)

## 2017-07-13 MED ORDER — SODIUM CHLORIDE 0.9 % IV SOLN
120.0000 mg/m2 | Freq: Once | INTRAVENOUS | Status: AC
  Administered 2017-07-13: 250 mg via INTRAVENOUS
  Filled 2017-07-13: qty 2.5

## 2017-07-13 MED ORDER — MESNA 100 MG/ML IV SOLN
Freq: Once | INTRAVENOUS | Status: AC
  Administered 2017-07-13: 13:00:00 via INTRAVENOUS
  Filled 2017-07-13: qty 48

## 2017-07-13 MED ORDER — SODIUM CHLORIDE 0.9 % IV SOLN
75.0000 mg/m2 | Freq: Once | INTRAVENOUS | Status: AC
  Administered 2017-07-13: 150 mg via INTRAVENOUS
  Filled 2017-07-13: qty 7.5

## 2017-07-13 MED ORDER — PALONOSETRON HCL INJECTION 0.25 MG/5ML
0.2500 mg | Freq: Once | INTRAVENOUS | Status: AC
  Administered 2017-07-13: 0.25 mg via INTRAVENOUS

## 2017-07-13 MED ORDER — POTASSIUM CHLORIDE 2 MEQ/ML IV SOLN
Freq: Once | INTRAVENOUS | Status: AC
  Administered 2017-07-13: 09:00:00 via INTRAVENOUS
  Filled 2017-07-13: qty 10

## 2017-07-13 MED ORDER — HEPARIN SOD (PORK) LOCK FLUSH 100 UNIT/ML IV SOLN
500.0000 [IU] | Freq: Once | INTRAVENOUS | Status: AC | PRN
  Administered 2017-07-13: 500 [IU]
  Filled 2017-07-13: qty 5

## 2017-07-13 MED ORDER — SODIUM CHLORIDE 0.9 % IV SOLN
Freq: Once | INTRAVENOUS | Status: AC
  Administered 2017-07-13: 10:00:00 via INTRAVENOUS
  Filled 2017-07-13: qty 5

## 2017-07-13 MED ORDER — SODIUM CHLORIDE 0.9 % IV SOLN
Freq: Once | INTRAVENOUS | Status: AC
  Administered 2017-07-13: 10:00:00 via INTRAVENOUS

## 2017-07-13 MED ORDER — SODIUM CHLORIDE 0.9 % IV SOLN
20.0000 mg/m2 | Freq: Once | INTRAVENOUS | Status: AC
  Administered 2017-07-13: 40 mg via INTRAVENOUS
  Filled 2017-07-13: qty 40

## 2017-07-13 MED ORDER — SODIUM CHLORIDE 0.9% FLUSH
10.0000 mL | INTRAVENOUS | Status: DC | PRN
  Administered 2017-07-13: 10 mL
  Filled 2017-07-13: qty 10

## 2017-07-13 NOTE — Patient Instructions (Signed)
[facility] Discharge Instructions for Patients Receiving Chemotherapy  Today you received the following chemotherapy agents Etoposide, Mesna, Cisplatin and Ifosfamide.  To help prevent nausea and vomiting after your treatment, we encourage you to take your nausea medication as directed -BUT NO ZOFRAN FOR 3 DAYS, TAKE COMPAZINE DURING THAT TIME. If you develop nausea and vomiting that is not controlled by your nausea medication, call the clinic.   BELOW ARE SYMPTOMS THAT SHOULD BE REPORTED IMMEDIATELY:  *FEVER GREATER THAN 100.5 F  *CHILLS WITH OR WITHOUT FEVER  NAUSEA AND VOMITING THAT IS NOT CONTROLLED WITH YOUR NAUSEA MEDICATION  *UNUSUAL SHORTNESS OF BREATH  *UNUSUAL BRUISING OR BLEEDING  TENDERNESS IN MOUTH AND THROAT WITH OR WITHOUT PRESENCE OF ULCERS  *URINARY PROBLEMS  *BOWEL PROBLEMS  UNUSUAL RASH Items with * indicate a potential emergency and should be followed up as soon as possible.  Feel free to call the clinic you have any questions or concerns. The clinic phone number is [phone].  Please show the [facility] at check-in to the Emergency Department and triage nurse.

## 2017-07-13 NOTE — Progress Notes (Signed)
Ok to run pre and post IVF hydration over one hour per MD ENNEVER each day of his cycle.

## 2017-07-14 ENCOUNTER — Inpatient Hospital Stay: Payer: Managed Care, Other (non HMO)

## 2017-07-14 ENCOUNTER — Encounter: Payer: Self-pay | Admitting: *Deleted

## 2017-07-14 DIAGNOSIS — C801 Malignant (primary) neoplasm, unspecified: Principal | ICD-10-CM

## 2017-07-14 DIAGNOSIS — C7802 Secondary malignant neoplasm of left lung: Secondary | ICD-10-CM

## 2017-07-14 DIAGNOSIS — Z5111 Encounter for antineoplastic chemotherapy: Secondary | ICD-10-CM | POA: Diagnosis not present

## 2017-07-14 MED ORDER — SODIUM CHLORIDE 0.9% FLUSH
10.0000 mL | INTRAVENOUS | Status: DC | PRN
  Administered 2017-07-14: 10 mL
  Filled 2017-07-14: qty 10

## 2017-07-14 MED ORDER — SODIUM CHLORIDE 0.9 % IV SOLN
Freq: Once | INTRAVENOUS | Status: AC
  Administered 2017-07-14: 13:00:00 via INTRAVENOUS
  Filled 2017-07-14: qty 48

## 2017-07-14 MED ORDER — SODIUM CHLORIDE 0.9 % IV SOLN
75.0000 mg/m2 | Freq: Once | INTRAVENOUS | Status: AC
  Administered 2017-07-14: 150 mg via INTRAVENOUS
  Filled 2017-07-14: qty 7.5

## 2017-07-14 MED ORDER — SODIUM CHLORIDE 0.9 % IV SOLN: 10.0000 mg | Freq: Once | INTRAVENOUS | Status: DC

## 2017-07-14 MED ORDER — SODIUM CHLORIDE 0.9 % IV SOLN
Freq: Once | INTRAVENOUS | Status: AC
  Administered 2017-07-14: 09:00:00 via INTRAVENOUS

## 2017-07-14 MED ORDER — DEXAMETHASONE SODIUM PHOSPHATE 10 MG/ML IJ SOLN
10.0000 mg | Freq: Once | INTRAMUSCULAR | Status: AC
  Administered 2017-07-14: 10 mg via INTRAVENOUS

## 2017-07-14 MED ORDER — HEPARIN SOD (PORK) LOCK FLUSH 100 UNIT/ML IV SOLN
500.0000 [IU] | Freq: Once | INTRAVENOUS | Status: AC | PRN
  Administered 2017-07-14: 500 [IU]
  Filled 2017-07-14: qty 5

## 2017-07-14 MED ORDER — CISPLATIN CHEMO INJECTION 100MG/100ML
20.0000 mg/m2 | Freq: Once | INTRAVENOUS | Status: AC
  Administered 2017-07-14: 40 mg via INTRAVENOUS
  Filled 2017-07-14: qty 40

## 2017-07-14 MED ORDER — POTASSIUM CHLORIDE 2 MEQ/ML IV SOLN
Freq: Once | INTRAVENOUS | Status: AC
  Administered 2017-07-14: 09:00:00 via INTRAVENOUS
  Filled 2017-07-14: qty 10

## 2017-07-14 MED ORDER — SODIUM CHLORIDE 0.9 % IV SOLN
120.0000 mg/m2 | Freq: Once | INTRAVENOUS | Status: AC
  Administered 2017-07-14: 250 mg via INTRAVENOUS
  Filled 2017-07-14: qty 2.5

## 2017-07-14 NOTE — Patient Instructions (Addendum)
[facility] Discharge Instructions for Patients Receiving Chemotherapy  Today you received the following chemotherapy agents Cisplatin, Etoposide, Ifosphamide  To help prevent nausea and vomiting after your treatment, we encourage you to take your nausea medication: You can take Compazine by mouth every 6 hours as needed for nausea or You can also take Ativan by mouth as needed for nausea.  Begin taking Zofran by mouth twice daily on Monday February 4th twice daily for 2 days then as needed.  Begin taking Decadron on Saturday Feb 2nd as directed.   If you develop nausea and vomiting that is not controlled by your nausea medication, call the clinic.   BELOW ARE SYMPTOMS THAT SHOULD BE REPORTED IMMEDIATELY:  *FEVER GREATER THAN 100.5 F  *CHILLS WITH OR WITHOUT FEVER  NAUSEA AND VOMITING THAT IS NOT CONTROLLED WITH YOUR NAUSEA MEDICATION  *UNUSUAL SHORTNESS OF BREATH  *UNUSUAL BRUISING OR BLEEDING  TENDERNESS IN MOUTH AND THROAT WITH OR WITHOUT PRESENCE OF ULCERS  *URINARY PROBLEMS  *BOWEL PROBLEMS  UNUSUAL RASH Items with * indicate a potential emergency and should be followed up as soon as possible.  Feel free to call the clinic should you have any questions or concerns. The clinic phone number is [phone].  Please show the [facility] at check-in to the Emergency Department and triage nurse.

## 2017-07-14 NOTE — Progress Notes (Signed)
Patient urinated total of 800 ccs today.

## 2017-07-15 ENCOUNTER — Inpatient Hospital Stay: Payer: Managed Care, Other (non HMO)

## 2017-07-15 ENCOUNTER — Other Ambulatory Visit: Payer: Self-pay

## 2017-07-15 VITALS — BP 126/67 | HR 77 | Temp 97.8°F | Resp 20

## 2017-07-15 DIAGNOSIS — Z5111 Encounter for antineoplastic chemotherapy: Secondary | ICD-10-CM | POA: Diagnosis not present

## 2017-07-15 DIAGNOSIS — C7802 Secondary malignant neoplasm of left lung: Secondary | ICD-10-CM

## 2017-07-15 DIAGNOSIS — C801 Malignant (primary) neoplasm, unspecified: Principal | ICD-10-CM

## 2017-07-15 MED ORDER — PALONOSETRON HCL INJECTION 0.25 MG/5ML
0.2500 mg | Freq: Once | INTRAVENOUS | Status: AC
  Administered 2017-07-15: 0.25 mg via INTRAVENOUS

## 2017-07-15 MED ORDER — SODIUM CHLORIDE 0.9% FLUSH
10.0000 mL | INTRAVENOUS | Status: DC | PRN
  Administered 2017-07-15: 10 mL
  Filled 2017-07-15: qty 10

## 2017-07-15 MED ORDER — POTASSIUM CHLORIDE 2 MEQ/ML IV SOLN
Freq: Once | INTRAVENOUS | Status: AC
  Administered 2017-07-15: 09:00:00 via INTRAVENOUS
  Filled 2017-07-15: qty 10

## 2017-07-15 MED ORDER — SODIUM CHLORIDE 0.9 % IV SOLN
120.0000 mg/m2 | Freq: Once | INTRAVENOUS | Status: AC
  Administered 2017-07-15: 250 mg via INTRAVENOUS
  Filled 2017-07-15: qty 2.5

## 2017-07-15 MED ORDER — SODIUM CHLORIDE 0.9 % IV SOLN
Freq: Once | INTRAVENOUS | Status: AC
  Administered 2017-07-15: 10:00:00 via INTRAVENOUS

## 2017-07-15 MED ORDER — SODIUM CHLORIDE 0.9 % IV SOLN
20.0000 mg/m2 | Freq: Once | INTRAVENOUS | Status: AC
  Administered 2017-07-15: 40 mg via INTRAVENOUS
  Filled 2017-07-15: qty 40

## 2017-07-15 MED ORDER — HEPARIN SOD (PORK) LOCK FLUSH 100 UNIT/ML IV SOLN
500.0000 [IU] | Freq: Once | INTRAVENOUS | Status: AC | PRN
  Administered 2017-07-15: 500 [IU]
  Filled 2017-07-15: qty 5

## 2017-07-15 MED ORDER — SODIUM CHLORIDE 0.9 % IV SOLN
75.0000 mg/m2 | Freq: Once | INTRAVENOUS | Status: AC
  Administered 2017-07-15: 150 mg via INTRAVENOUS
  Filled 2017-07-15: qty 7.5

## 2017-07-15 MED ORDER — SODIUM CHLORIDE 0.9 % IV SOLN
Freq: Once | INTRAVENOUS | Status: AC
  Administered 2017-07-15: 13:00:00 via INTRAVENOUS
  Filled 2017-07-15: qty 48

## 2017-07-15 MED ORDER — SODIUM CHLORIDE 0.9 % IV SOLN
Freq: Once | INTRAVENOUS | Status: AC
  Administered 2017-07-15: 10:00:00 via INTRAVENOUS
  Filled 2017-07-15: qty 5

## 2017-07-15 NOTE — Patient Instructions (Signed)
[facility] Discharge Instructions for Patients Receiving Chemotherapy  Today you received the following chemotherapy agents Etoposide, Mesna, Cisplatin and Ifosfamide.  To help prevent nausea and vomiting after your treatment, we encourage you to take your nausea medication as directed -BUT NO ZOFRAN FOR 3 DAYS, TAKE COMPAZINE DURING THAT TIME. If you develop nausea and vomiting that is not controlled by your nausea medication, call the clinic.   BELOW ARE SYMPTOMS THAT SHOULD BE REPORTED IMMEDIATELY:  *FEVER GREATER THAN 100.5 F  *CHILLS WITH OR WITHOUT FEVER  NAUSEA AND VOMITING THAT IS NOT CONTROLLED WITH YOUR NAUSEA MEDICATION  *UNUSUAL SHORTNESS OF BREATH  *UNUSUAL BRUISING OR BLEEDING  TENDERNESS IN MOUTH AND THROAT WITH OR WITHOUT PRESENCE OF ULCERS  *URINARY PROBLEMS  *BOWEL PROBLEMS  UNUSUAL RASH Items with * indicate a potential emergency and should be followed up as soon as possible.  Feel free to call the clinic you have any questions or concerns. The clinic phone number is [phone].  Please show the [facility] at check-in to the Emergency Department and triage nurse.

## 2017-07-16 ENCOUNTER — Inpatient Hospital Stay: Payer: Managed Care, Other (non HMO)

## 2017-07-16 VITALS — BP 136/77 | HR 77 | Temp 97.7°F | Resp 18

## 2017-07-16 DIAGNOSIS — Z5111 Encounter for antineoplastic chemotherapy: Secondary | ICD-10-CM | POA: Diagnosis not present

## 2017-07-16 DIAGNOSIS — C7802 Secondary malignant neoplasm of left lung: Secondary | ICD-10-CM

## 2017-07-16 DIAGNOSIS — C801 Malignant (primary) neoplasm, unspecified: Principal | ICD-10-CM

## 2017-07-16 LAB — CULTURE, BLOOD (ROUTINE X 2)
Culture: NO GROWTH
Culture: NO GROWTH
SPECIAL REQUESTS: ADEQUATE
Special Requests: ADEQUATE

## 2017-07-16 MED ORDER — SODIUM CHLORIDE 0.9 % IV SOLN
120.0000 mg/m2 | Freq: Once | INTRAVENOUS | Status: AC
  Administered 2017-07-16: 250 mg via INTRAVENOUS
  Filled 2017-07-16: qty 2.5

## 2017-07-16 MED ORDER — DEXAMETHASONE SODIUM PHOSPHATE 10 MG/ML IJ SOLN
10.0000 mg | Freq: Once | INTRAMUSCULAR | Status: AC
  Administered 2017-07-16: 10 mg via INTRAVENOUS

## 2017-07-16 MED ORDER — POTASSIUM CHLORIDE 2 MEQ/ML IV SOLN
Freq: Once | INTRAVENOUS | Status: AC
  Administered 2017-07-16: 09:00:00 via INTRAVENOUS
  Filled 2017-07-16: qty 10

## 2017-07-16 MED ORDER — SODIUM CHLORIDE 0.9 % IV SOLN
Freq: Once | INTRAVENOUS | Status: AC
  Administered 2017-07-16: 12:00:00 via INTRAVENOUS
  Filled 2017-07-16: qty 48

## 2017-07-16 MED ORDER — SODIUM CHLORIDE 0.9 % IV SOLN
20.0000 mg/m2 | Freq: Once | INTRAVENOUS | Status: AC
  Administered 2017-07-16: 40 mg via INTRAVENOUS
  Filled 2017-07-16: qty 40

## 2017-07-16 MED ORDER — ETOPOSIDE CHEMO INJECTION 1 GM/50ML
75.0000 mg/m2 | Freq: Once | INTRAVENOUS | Status: AC
  Administered 2017-07-16: 150 mg via INTRAVENOUS
  Filled 2017-07-16: qty 7.5

## 2017-07-16 MED ORDER — SODIUM CHLORIDE 0.9 % IV SOLN
Freq: Once | INTRAVENOUS | Status: AC
Start: 2017-07-16 — End: 2017-07-16
  Administered 2017-07-16: 10:00:00 via INTRAVENOUS

## 2017-07-16 MED ORDER — SODIUM CHLORIDE 0.9% FLUSH
10.0000 mL | INTRAVENOUS | Status: DC | PRN
  Administered 2017-07-16: 10 mL
  Filled 2017-07-16: qty 10

## 2017-07-16 MED ORDER — HEPARIN SOD (PORK) LOCK FLUSH 100 UNIT/ML IV SOLN
500.0000 [IU] | Freq: Once | INTRAVENOUS | Status: AC | PRN
  Administered 2017-07-16: 500 [IU]
  Filled 2017-07-16: qty 5

## 2017-07-17 ENCOUNTER — Inpatient Hospital Stay: Payer: Managed Care, Other (non HMO) | Attending: Hematology & Oncology

## 2017-07-17 VITALS — BP 126/71 | HR 79 | Temp 98.4°F | Resp 18

## 2017-07-17 DIAGNOSIS — C801 Malignant (primary) neoplasm, unspecified: Secondary | ICD-10-CM

## 2017-07-17 DIAGNOSIS — C6292 Malignant neoplasm of left testis, unspecified whether descended or undescended: Secondary | ICD-10-CM | POA: Insufficient documentation

## 2017-07-17 DIAGNOSIS — C7802 Secondary malignant neoplasm of left lung: Secondary | ICD-10-CM

## 2017-07-17 DIAGNOSIS — C7800 Secondary malignant neoplasm of unspecified lung: Secondary | ICD-10-CM | POA: Diagnosis not present

## 2017-07-17 DIAGNOSIS — Z5111 Encounter for antineoplastic chemotherapy: Secondary | ICD-10-CM | POA: Insufficient documentation

## 2017-07-17 MED ORDER — SODIUM CHLORIDE 0.9 % IV SOLN
120.0000 mg/m2 | Freq: Once | INTRAVENOUS | Status: AC
  Administered 2017-07-17: 250 mg via INTRAVENOUS
  Filled 2017-07-17: qty 2.5

## 2017-07-17 MED ORDER — SODIUM CHLORIDE 0.9% FLUSH
10.0000 mL | INTRAVENOUS | Status: DC | PRN
  Administered 2017-07-17: 10 mL
  Filled 2017-07-17: qty 10

## 2017-07-17 MED ORDER — SODIUM CHLORIDE 0.9 % IV SOLN
Freq: Once | INTRAVENOUS | Status: AC
  Administered 2017-07-17: 09:00:00 via INTRAVENOUS

## 2017-07-17 MED ORDER — HEPARIN SOD (PORK) LOCK FLUSH 100 UNIT/ML IV SOLN
500.0000 [IU] | Freq: Once | INTRAVENOUS | Status: AC | PRN
  Administered 2017-07-17: 500 [IU]
  Filled 2017-07-17: qty 5

## 2017-07-17 MED ORDER — SODIUM CHLORIDE 0.9 % IV SOLN
75.0000 mg/m2 | Freq: Once | INTRAVENOUS | Status: AC
  Administered 2017-07-17: 150 mg via INTRAVENOUS
  Filled 2017-07-17: qty 7.5

## 2017-07-17 MED ORDER — SODIUM CHLORIDE 0.9 % IV SOLN
Freq: Once | INTRAVENOUS | Status: AC
  Administered 2017-07-17: 10:00:00 via INTRAVENOUS
  Filled 2017-07-17: qty 5

## 2017-07-17 MED ORDER — POTASSIUM CHLORIDE 2 MEQ/ML IV SOLN
Freq: Once | INTRAVENOUS | Status: AC
  Administered 2017-07-17: 08:00:00 via INTRAVENOUS
  Filled 2017-07-17: qty 10

## 2017-07-17 MED ORDER — SODIUM CHLORIDE 0.9 % IV SOLN
20.0000 mg/m2 | Freq: Once | INTRAVENOUS | Status: AC
  Administered 2017-07-17: 40 mg via INTRAVENOUS
  Filled 2017-07-17: qty 40

## 2017-07-17 MED ORDER — SODIUM CHLORIDE 0.9 % IV SOLN
Freq: Once | INTRAVENOUS | Status: AC
  Administered 2017-07-17: 13:00:00 via INTRAVENOUS
  Filled 2017-07-17: qty 48

## 2017-07-17 MED ORDER — PALONOSETRON HCL INJECTION 0.25 MG/5ML
0.2500 mg | Freq: Once | INTRAVENOUS | Status: AC
  Administered 2017-07-17: 0.25 mg via INTRAVENOUS

## 2017-07-17 MED ORDER — PEGFILGRASTIM 6 MG/0.6ML ~~LOC~~ PSKT
6.0000 mg | PREFILLED_SYRINGE | Freq: Once | SUBCUTANEOUS | Status: AC
  Administered 2017-07-17: 6 mg via SUBCUTANEOUS

## 2017-07-17 NOTE — Patient Instructions (Signed)
[facility] Discharge Instructions for Patients Receiving Chemotherapy  Today you received the following chemotherapy agents Etoposide, Mesna, Cisplatin and Ifosfamide.  To help prevent nausea and vomiting after your treatment, we encourage you to take your nausea medication as directed -BUT NO ZOFRAN FOR 3 DAYS, TAKE COMPAZINE DURING THAT TIME. If you develop nausea and vomiting that is not controlled by your nausea medication, call the clinic.   BELOW ARE SYMPTOMS THAT SHOULD BE REPORTED IMMEDIATELY:  *FEVER GREATER THAN 100.5 F  *CHILLS WITH OR WITHOUT FEVER  NAUSEA AND VOMITING THAT IS NOT CONTROLLED WITH YOUR NAUSEA MEDICATION  *UNUSUAL SHORTNESS OF BREATH  *UNUSUAL BRUISING OR BLEEDING  TENDERNESS IN MOUTH AND THROAT WITH OR WITHOUT PRESENCE OF ULCERS  *URINARY PROBLEMS  *BOWEL PROBLEMS  UNUSUAL RASH Items with * indicate a potential emergency and should be followed up as soon as possible.  Feel free to call the clinic you have any questions or concerns. The clinic phone number is [phone].  Please show the [facility] at check-in to the Emergency Department and triage nurse.   Pegfilgrastim injection What is this medicine? PEGFILGRASTIM (PEG fil gra stim) is a long-acting granulocyte colony-stimulating factor that stimulates the growth of neutrophils, a type of white blood cell important in the body's fight against infection. It is used to reduce the incidence of fever and infection in patients with certain types of cancer who are receiving chemotherapy that affects the bone marrow, and to increase survival after being exposed to high doses of radiation. This medicine may be used for other purposes; ask your health care provider or pharmacist if you have questions. COMMON BRAND NAME(S): Neulasta What should I tell my health care provider before I take this medicine? They need to know if you have any of these conditions: -kidney disease -latex  allergy -ongoing radiation therapy -sickle cell disease -skin reactions to acrylic adhesives (On-Body Injector only) -an unusual or allergic reaction to pegfilgrastim, filgrastim, other medicines, foods, dyes, or preservatives -pregnant or trying to get pregnant -breast-feeding How should I use this medicine? This medicine is for injection under the skin. If you get this medicine at home, you will be taught how to prepare and give the pre-filled syringe or how to use the On-body Injector. Refer to the patient Instructions for Use for detailed instructions. Use exactly as directed. Tell your healthcare provider immediately if you suspect that the On-body Injector may not have performed as intended or if you suspect the use of the On-body Injector resulted in a missed or partial dose. It is important that you put your used needles and syringes in a special sharps container. Do not put them in a trash can. If you do not have a sharps container, call your pharmacist or healthcare provider to get one. Talk to your pediatrician regarding the use of this medicine in children. While this drug may be prescribed for selected conditions, precautions do apply. Overdosage: If you think you have taken too much of this medicine contact a poison control center or emergency room at once. NOTE: This medicine is only for you. Do not share this medicine with others. What if I miss a dose? It is important not to miss your dose. Call your doctor or health care professional if you miss your dose. If you miss a dose due to an On-body Injector failure or leakage, a new dose should be administered as soon as possible using a single prefilled syringe for manual use. What  may interact with this medicine? Interactions have not been studied. Give your health care provider a list of all the medicines, herbs, non-prescription drugs, or dietary supplements you use. Also tell them if you smoke, drink alcohol, or use illegal drugs.  Some items may interact with your medicine. This list may not describe all possible interactions. Give your health care provider a list of all the medicines, herbs, non-prescription drugs, or dietary supplements you use. Also tell them if you smoke, drink alcohol, or use illegal drugs. Some items may interact with your medicine. What should I watch for while using this medicine? You may need blood work done while you are taking this medicine. If you are going to need a MRI, CT scan, or other procedure, tell your doctor that you are using this medicine (On-Body Injector only). What side effects may I notice from receiving this medicine? Side effects that you should report to your doctor or health care professional as soon as possible: -allergic reactions like skin rash, itching or hives, swelling of the face, lips, or tongue -dizziness -fever -pain, redness, or irritation at site where injected -pinpoint red spots on the skin -red or dark-brown urine -shortness of breath or breathing problems -stomach or side pain, or pain at the shoulder -swelling -tiredness -trouble passing urine or change in the amount of urine Side effects that usually do not require medical attention (report to your doctor or health care professional if they continue or are bothersome): -bone pain -muscle pain This list may not describe all possible side effects. Call your doctor for medical advice about side effects. You may report side effects to FDA at 1-800-FDA-1088. Where should I keep my medicine? Keep out of the reach of children. Store pre-filled syringes in a refrigerator between 2 and 8 degrees C (36 and 46 degrees F). Do not freeze. Keep in carton to protect from light. Throw away this medicine if it is left out of the refrigerator for more than 48 hours. Throw away any unused medicine after the expiration date. NOTE: This sheet is a summary. It may not cover all possible information. If you have questions  about this medicine, talk to your doctor, pharmacist, or health care provider.  2018 [employer]vier/[employer] (2016-05-29 12:58:03)

## 2017-07-28 ENCOUNTER — Encounter: Payer: Self-pay | Admitting: Hematology & Oncology

## 2017-08-03 ENCOUNTER — Other Ambulatory Visit: Payer: Self-pay

## 2017-08-03 ENCOUNTER — Inpatient Hospital Stay: Payer: Managed Care, Other (non HMO)

## 2017-08-03 ENCOUNTER — Inpatient Hospital Stay (HOSPITAL_BASED_OUTPATIENT_CLINIC_OR_DEPARTMENT_OTHER): Payer: Managed Care, Other (non HMO) | Admitting: Hematology & Oncology

## 2017-08-03 VITALS — BP 152/66 | HR 94 | Resp 17

## 2017-08-03 DIAGNOSIS — R799 Abnormal finding of blood chemistry, unspecified: Secondary | ICD-10-CM

## 2017-08-03 DIAGNOSIS — C801 Malignant (primary) neoplasm, unspecified: Principal | ICD-10-CM

## 2017-08-03 DIAGNOSIS — C6292 Malignant neoplasm of left testis, unspecified whether descended or undescended: Secondary | ICD-10-CM | POA: Diagnosis not present

## 2017-08-03 DIAGNOSIS — C7802 Secondary malignant neoplasm of left lung: Secondary | ICD-10-CM

## 2017-08-03 DIAGNOSIS — C7800 Secondary malignant neoplasm of unspecified lung: Secondary | ICD-10-CM | POA: Diagnosis not present

## 2017-08-03 DIAGNOSIS — Z5111 Encounter for antineoplastic chemotherapy: Secondary | ICD-10-CM | POA: Diagnosis not present

## 2017-08-03 LAB — CBC WITH DIFFERENTIAL (CANCER CENTER ONLY)
Basophils Absolute: 0.1 10*3/uL (ref 0.0–0.1)
Basophils Relative: 2 %
EOS ABS: 0 10*3/uL (ref 0.0–0.5)
Eosinophils Relative: 0 %
HCT: 35.7 % — ABNORMAL LOW (ref 38.7–49.9)
HEMOGLOBIN: 12 g/dL — AB (ref 13.0–17.1)
LYMPHS ABS: 1.5 10*3/uL (ref 0.9–3.3)
LYMPHS PCT: 30 %
MCH: 31.6 pg (ref 28.0–33.4)
MCHC: 33.6 g/dL (ref 32.0–35.9)
MCV: 93.9 fL (ref 82.0–98.0)
MONOS PCT: 6 %
Monocytes Absolute: 0.3 10*3/uL (ref 0.1–0.9)
NEUTROS PCT: 62 %
Neutro Abs: 3.2 10*3/uL (ref 1.5–6.5)
Platelet Count: 122 10*3/uL — ABNORMAL LOW (ref 145–400)
RBC: 3.8 MIL/uL — AB (ref 4.20–5.70)
RDW: 12.9 % (ref 11.1–15.7)
WBC: 5.1 10*3/uL (ref 4.0–10.0)

## 2017-08-03 LAB — CMP (CANCER CENTER ONLY)
ALBUMIN: 3.2 g/dL — AB (ref 3.5–5.0)
ALK PHOS: 98 U/L — AB (ref 26–84)
ALT: 27 U/L (ref 10–47)
AST: 21 U/L (ref 11–38)
Anion gap: 5 (ref 5–15)
BUN: 13 mg/dL (ref 7–22)
CHLORIDE: 107 mmol/L (ref 98–108)
CO2: 27 mmol/L (ref 18–33)
CREATININE: 0.7 mg/dL (ref 0.60–1.20)
Calcium: 8.6 mg/dL (ref 8.0–10.3)
Glucose, Bld: 104 mg/dL (ref 73–118)
POTASSIUM: 4.3 mmol/L (ref 3.3–4.7)
SODIUM: 139 mmol/L (ref 128–145)
Total Bilirubin: 0.5 mg/dL (ref 0.2–1.6)
Total Protein: 6.4 g/dL (ref 6.4–8.1)

## 2017-08-03 LAB — LACTATE DEHYDROGENASE: LDH: 200 U/L (ref 125–245)

## 2017-08-03 MED ORDER — PALONOSETRON HCL INJECTION 0.25 MG/5ML
0.2500 mg | Freq: Once | INTRAVENOUS | Status: AC
  Administered 2017-08-03: 0.25 mg via INTRAVENOUS

## 2017-08-03 MED ORDER — PALONOSETRON HCL INJECTION 0.25 MG/5ML
INTRAVENOUS | Status: AC
  Filled 2017-08-03: qty 5

## 2017-08-03 MED ORDER — POTASSIUM CHLORIDE 2 MEQ/ML IV SOLN
Freq: Once | INTRAVENOUS | Status: AC
  Administered 2017-08-03: 09:00:00 via INTRAVENOUS
  Filled 2017-08-03: qty 10

## 2017-08-03 MED ORDER — SODIUM CHLORIDE 0.9 % IV SOLN
Freq: Once | INTRAVENOUS | Status: AC
  Administered 2017-08-03: 12:00:00 via INTRAVENOUS
  Filled 2017-08-03: qty 48

## 2017-08-03 MED ORDER — SODIUM CHLORIDE 0.9 % IV SOLN
Freq: Once | INTRAVENOUS | Status: AC
  Administered 2017-08-03: 10:00:00 via INTRAVENOUS
  Filled 2017-08-03: qty 5

## 2017-08-03 MED ORDER — SODIUM CHLORIDE 0.9 % IV SOLN
20.0000 mg/m2 | Freq: Once | INTRAVENOUS | Status: AC
  Administered 2017-08-03: 40 mg via INTRAVENOUS
  Filled 2017-08-03: qty 40

## 2017-08-03 MED ORDER — SODIUM CHLORIDE 0.9 % IV SOLN
75.0000 mg/m2 | Freq: Once | INTRAVENOUS | Status: AC
  Administered 2017-08-03: 150 mg via INTRAVENOUS
  Filled 2017-08-03: qty 7.5

## 2017-08-03 MED ORDER — HEPARIN SOD (PORK) LOCK FLUSH 100 UNIT/ML IV SOLN
500.0000 [IU] | Freq: Once | INTRAVENOUS | Status: AC | PRN
  Administered 2017-08-03: 500 [IU]
  Filled 2017-08-03: qty 5

## 2017-08-03 MED ORDER — SODIUM CHLORIDE 0.9% FLUSH
10.0000 mL | INTRAVENOUS | Status: DC | PRN
  Administered 2017-08-03: 10 mL
  Filled 2017-08-03: qty 10

## 2017-08-03 MED ORDER — SODIUM CHLORIDE 0.9 % IV SOLN
120.0000 mg/m2 | Freq: Once | INTRAVENOUS | Status: AC
  Administered 2017-08-03: 250 mg via INTRAVENOUS
  Filled 2017-08-03: qty 2.5

## 2017-08-03 MED ORDER — SODIUM CHLORIDE 0.9 % IV SOLN
Freq: Once | INTRAVENOUS | Status: AC
  Administered 2017-08-03: 10:00:00 via INTRAVENOUS

## 2017-08-03 NOTE — Patient Instructions (Signed)
Implanted Port Home Guide An implanted port is a type of central line that is placed under the skin. Central lines are used to provide IV access when treatment or nutrition needs to be given through a person's veins. Implanted ports are used for long-term IV access. An implanted port may be placed because:  You need IV medicine that would be irritating to the small veins in your hands or arms.  You need long-term IV medicines, such as antibiotics.  You need IV nutrition for a long period.  You need frequent blood draws for lab tests.  You need dialysis.  Implanted ports are usually placed in the chest area, but they can also be placed in the upper arm, the abdomen, or the leg. An implanted port has two main parts:  Reservoir. The reservoir is round and will appear as a small, raised area under your skin. The reservoir is the part where a needle is inserted to give medicines or draw blood.  Catheter. The catheter is a thin, flexible tube that extends from the reservoir. The catheter is placed into a large vein. Medicine that is inserted into the reservoir goes into the catheter and then into the vein.  How will I care for my incision site? Do not get the incision site wet. Bathe or shower as directed by your health care provider. How is my port accessed? Special steps must be taken to access the port:  Before the port is accessed, a numbing cream can be placed on the skin. This helps numb the skin over the port site.  Your health care provider uses a sterile technique to access the port. ? Your health care provider must put on a mask and sterile gloves. ? The skin over your port is cleaned carefully with an antiseptic and allowed to dry. ? The port is gently pinched between sterile gloves, and a needle is inserted into the port.  Only "non-coring" port needles should be used to access the port. Once the port is accessed, a blood return should be checked. This helps ensure that the port  is in the vein and is not clogged.  If your port needs to remain accessed for a constant infusion, a clear (transparent) bandage will be placed over the needle site. The bandage and needle will need to be changed every week, or as directed by your health care provider.  Keep the bandage covering the needle clean and dry. Do not get it wet. Follow your health care provider's instructions on how to take a shower or bath while the port is accessed.  If your port does not need to stay accessed, no bandage is needed over the port.  What is flushing? Flushing helps keep the port from getting clogged. Follow your health care provider's instructions on how and when to flush the port. Ports are usually flushed with saline solution or a medicine called heparin. The need for flushing will depend on how the port is used.  If the port is used for intermittent medicines or blood draws, the port will need to be flushed: ? After medicines have been given. ? After blood has been drawn. ? As part of routine maintenance.  If a constant infusion is running, the port may not need to be flushed.  How long will my port stay implanted? The port can stay in for as long as your health care provider thinks it is needed. When it is time for the port to come out, surgery will be   done to remove it. The procedure is similar to the one performed when the port was put in. When should I seek immediate medical care? When you have an implanted port, you should seek immediate medical care if:  You notice a bad smell coming from the incision site.  You have swelling, redness, or drainage at the incision site.  You have more swelling or pain at the port site or the surrounding area.  You have a fever that is not controlled with medicine.  This information is not intended to replace advice given to you by your health care provider. Make sure you discuss any questions you have with your health care provider. Document  Released: 06/02/2005 Document Revised: 11/08/2015 Document Reviewed: 02/07/2013 [employer] Interactive Patient Education  2017 [employer]  

## 2017-08-03 NOTE — Patient Instructions (Signed)
Etoposide, VP-16 injection What is this medicine? ETOPOSIDE, VP-16 (e toe POE side) is a chemotherapy drug. It is used to treat testicular cancer, lung cancer, and other cancers. This medicine may be used for other purposes; ask your health care provider or pharmacist if you have questions. COMMON BRAND NAME(S): Etopophos, Toposar, VePesid What should I tell my health care provider before I take this medicine? They need to know if you have any of these conditions: -infection -kidney disease -liver disease -low blood counts, like low white cell, platelet, or red cell counts -an unusual or allergic reaction to etoposide, other medicines, foods, dyes, or preservatives -pregnant or trying to get pregnant -breast-feeding How should I use this medicine? This medicine is for infusion into a vein. It is administered in a hospital or clinic by a specially trained health care professional. Talk to your pediatrician regarding the use of this medicine in children. Special care may be needed. Overdosage: If you think you have taken too much of this medicine contact a poison control center or emergency room at once. NOTE: This medicine is only for you. Do not share this medicine with others. What if I miss a dose? It is important not to miss your dose. Call your doctor or health care professional if you are unable to keep an appointment. What may interact with this medicine? -aspirin -certain medications for seizures like carbamazepine, phenobarbital, phenytoin, valproic acid -cyclosporine -levamisole -warfarin This list may not describe all possible interactions. Give your health care provider a list of all the medicines, herbs, non-prescription drugs, or dietary supplements you use. Also tell them if you smoke, drink alcohol, or use illegal drugs. Some items may interact with your medicine. What should I watch for while using this medicine? Visit your doctor for checks on your progress. This drug  may make you feel generally unwell. This is not uncommon, as chemotherapy can affect healthy cells as well as cancer cells. Report any side effects. Continue your course of treatment even though you feel ill unless your doctor tells you to stop. In some cases, you may be given additional medicines to help with side effects. Follow all directions for their use. Call your doctor or health care professional for advice if you get a fever, chills or sore throat, or other symptoms of a cold or flu. Do not treat yourself. This drug decreases your body's ability to fight infections. Try to avoid being around people who are sick. This medicine may increase your risk to bruise or bleed. Call your doctor or health care professional if you notice any unusual bleeding. Talk to your doctor about your risk of cancer. You may be more at risk for certain types of cancers if you take this medicine. Do not become pregnant while taking this medicine or for at least 6 months after stopping it. Women should inform their doctor if they wish to become pregnant or think they might be pregnant. Women of child-bearing potential will need to have a negative pregnancy test before starting this medicine. There is a potential for serious side effects to an unborn child. Talk to your health care professional or pharmacist for more information. Do not breast-feed an infant while taking this medicine. Men must use a latex condom during sexual contact with a woman while taking this medicine and for at least 4 months after stopping it. A latex condom is needed even if you have had a vasectomy. Contact your doctor right away if your partner becomes pregnant. Do   not donate sperm while taking this medicine and for at least 4 months after you stop taking this medicine. Men should inform their doctors if they wish to father a child. This medicine may lower sperm counts. What side effects may I notice from receiving this medicine? Side effects that  you should report to your doctor or health care professional as soon as possible: -allergic reactions like skin rash, itching or hives, swelling of the face, lips, or tongue -low blood counts - this medicine may decrease the number of white blood cells, red blood cells and platelets. You may be at increased risk for infections and bleeding. -signs of infection - fever or chills, cough, sore throat, pain or difficulty passing urine -signs of decreased platelets or bleeding - bruising, pinpoint red spots on the skin, black, tarry stools, blood in the urine -signs of decreased red blood cells - unusually weak or tired, fainting spells, lightheadedness -breathing problems -changes in vision -mouth or throat sores or ulcers -pain, redness, swelling or irritation at the injection site -pain, tingling, numbness in the hands or feet -redness, blistering, peeling or loosening of the skin, including inside the mouth -seizures -vomiting Side effects that usually do not require medical attention (report to your doctor or health care professional if they continue or are bothersome): -diarrhea -hair loss -loss of appetite -nausea -stomach pain This list may not describe all possible side effects. Call your doctor for medical advice about side effects. You may report side effects to FDA at 1-800-FDA-1088. Where should I keep my medicine? This drug is given in a hospital or clinic and will not be stored at home. NOTE: This sheet is a summary. It may not cover all possible information. If you have questions about this medicine, talk to your doctor, pharmacist, or health care provider.  2018 [employer] Standard (2015-05-25 11:53:23) Ifosfamide injection What is this medicine? IFOSFAMIDE (eye FOS fa mide) is a chemotherapy drug. It slows the growth of cancer cells. This medicine is used to treat testicular cancer. This medicine may be used for other purposes; ask your health care provider or pharmacist  if you have questions. COMMON BRAND NAME(S): Ifex, Ifex/Mesna What should I tell my health care provider before I take this medicine? They need to know if you have any of these conditions: -bladder problems -blood disorders -dehydration -infection (especially a virus infection such as chickenpox, cold sores, or herpes) -kidney disease -liver disease -recent or ongoing radiation therapy -an unusual or allergic reaction to ifosfamide, other chemotherapy, other medicines, foods, dyes, or preservatives -pregnant or trying to get pregnant -breast-feeding How should I use this medicine? This drug is given as an infusion into a vein. It is administered in a hospital or clinic by a specially trained health care professional. Talk to your pediatrician regarding the use of this medicine in children. Special care may be needed. Overdosage: If you think you have taken too much of this medicine contact a poison control center or emergency room at once. NOTE: This medicine is only for you. Do not share this medicine with others. What if I miss a dose? It is important not to miss your dose. Call your doctor or health care professional if you are unable to keep an appointment. What may interact with this medicine? Do not take this medicine with any of the following medications: -nalidixic acid This medicine may also interact with the following medications: -medicines to increase blood counts like filgrastim, pegfilgrastim, sargramostim -St. John's Wort -vaccines Talk to  your doctor or health care professional before taking any of these medicines: -acetaminophen -aspirin -ibuprofen -ketoprofen -naproxen This list may not describe all possible interactions. Give your health care provider a list of all the medicines, herbs, non-prescription drugs, or dietary supplements you use. Also tell them if you smoke, drink alcohol, or use illegal drugs. Some items may interact with your medicine. What should I  watch for while using this medicine? Visit your doctor for checks on your progress. This drug may make you feel generally unwell. This is not uncommon, as chemotherapy can affect healthy cells as well as cancer cells. Report any side effects. Continue your course of treatment even though you feel ill unless your doctor tells you to stop. Drink water or other fluids as directed. Urinate often, even at night. In some cases, you may be given additional medicines to help with side effects. Follow all directions for their use. Call your doctor or health care professional for advice if you get a fever, chills or sore throat, or other symptoms of a cold or flu. Do not treat yourself. This drug decreases your body's ability to fight infections. Try to avoid being around people who are sick. This medicine may increase your risk to bruise or bleed. Call your doctor or health care professional if you notice any unusual bleeding. Be careful brushing and flossing your teeth or using a toothpick because you may get an infection or bleed more easily. If you have any dental work done, tell your dentist you are receiving this medicine. Avoid taking products that contain aspirin, acetaminophen, ibuprofen, naproxen, or ketoprofen unless instructed by your doctor. These medicines may hide a fever. Do not become pregnant while taking this medicine. Women should inform their doctor if they wish to become pregnant or think they might be pregnant. There is a potential for serious side effects to an unborn child. Talk to your health care professional or pharmacist for more information. Do not breast-feed an infant while taking this medicine. What side effects may I notice from receiving this medicine? Side effects that you should report to your doctor or health care professional as soon as possible: -allergic reactions like skin rash, itching or hives, swelling of the face, lips, or tongue -low blood counts - this medicine may  decrease the number of white blood cells, red blood cells and platelets. You may be at increased risk for infections and bleeding. -signs of infection - fever or chills, cough, sore throat, pain or difficulty passing urine -signs of decreased platelets or bleeding - bruising, pinpoint red spots on the skin, black, tarry stools, blood in the urine -signs of decreased red blood cells - unusually weak or tired, fainting spells, lightheadedness -agitation -breathing problems -confusion -dark urine -hallucinations -mouth sores -pain, swelling, redness at site where injected -seizures -trouble passing urine or change in the amount of urine -yellowing of the eyes or skin Side effects that usually do not require medical attention (report to your doctor or health care professional if they continue or are bothersome): -diarrhea -hair loss -loss of appetite -nausea, vomiting This list may not describe all possible side effects. Call your doctor for medical advice about side effects. You may report side effects to FDA at 1-800-FDA-1088. Where should I keep my medicine? This drug is given in a hospital or clinic and will not be stored at home. NOTE: This sheet is a summary. It may not cover all possible information. If you have questions about this medicine, talk to  your doctor, pharmacist, or health care provider.  2018 [employer] Standard (2007-10-19 16:23:05) Mesna injection What is this medicine? MESNA (MES na) is used to prevent bleeding from the bladder during treatment with ifosfamide. This medicine does not reduce the chance of getting other side effects of cancer chemotherapy. This medicine may be used for other purposes; ask your health care provider or pharmacist if you have questions. COMMON BRAND NAME(S): Mesnex What should I tell my health care provider before I take this medicine? They need to know if you have any of these conditions: -autoimmune disease like lupus, nephritis, or  rheumatoid arthritis -an unusual or allergic reaction to mesna, benzyl alcohol, sulfur medicines, other medicines, foods, dyes, or preservatives -pregnant or trying to get pregnant -breast-feeding How should I use this medicine? This medicine is for infusion into a vein. It is given by a health care professional in a hospital or clinic setting. Talk to your pediatrician regarding the use of this medicine in children. Special care may be needed. Overdosage: If you think you have taken too much of this medicine contact a poison control center or emergency room at once. NOTE: This medicine is only for you. Do not share this medicine with others. What if I miss a dose? This does not apply. What may interact with this medicine? Interactions are not expected. This list may not describe all possible interactions. Give your health care provider a list of all the medicines, herbs, non-prescription drugs, or dietary supplements you use. Also tell them if you smoke, drink alcohol, or use illegal drugs. Some items may interact with your medicine. What should I watch for while using this medicine? Your doctor will follow your condition closely while you are taking this medicine. Tell your doctor right away if you see that your urine has turned a pink or red color. It is important to drink at least a quart (4 cups) of fluids each day that you take this medicine. What side effects may I notice from receiving this medicine? Side effects that you should report to your doctor or health care professional as soon as possible: -allergic reactions like skin rash, itching or hives, swelling of the face, lips, or tongue -breathing problems -blood in your urine or pink to red colored urine -fever, chills, or sore throat -flushing or redness to skin -mouth sores -pain or redness at site where injected -swelling of ankles or feet -vomiting Side effects that usually do not require medical attention (report to your  doctor or health care professional if they continue or are bothersome): -aches and pains -bad taste in mouth -diarrhea -dizziness -hair loss -headache -nausea This list may not describe all possible side effects. Call your doctor for medical advice about side effects. You may report side effects to FDA at 1-800-FDA-1088. Where should I keep my medicine? This drug is given in a hospital or clinic and will not be stored at home. NOTE: This sheet is a summary. It may not cover all possible information. If you have questions about this medicine, talk to your doctor, pharmacist, or health care provider.  2018 [employer] Standard (2007-12-20 16:43:56) Cisplatin injection What is this medicine? CISPLATIN (SIS pla tin) is a chemotherapy drug. It targets fast dividing cells, like cancer cells, and causes these cells to die. This medicine is used to treat many types of cancer like bladder, ovarian, and testicular cancers. This medicine may be used for other purposes; ask your health care provider or pharmacist if you have questions. COMMON  BRAND NAME(S): Platinol, Platinol -AQ What should I tell my health care provider before I take this medicine? They need to know if you have any of these conditions: -blood disorders -hearing problems -kidney disease -recent or ongoing radiation therapy -an unusual or allergic reaction to cisplatin, carboplatin, other chemotherapy, other medicines, foods, dyes, or preservatives -pregnant or trying to get pregnant -breast-feeding How should I use this medicine? This drug is given as an infusion into a vein. It is administered in a hospital or clinic by a specially trained health care professional. Talk to your pediatrician regarding the use of this medicine in children. Special care may be needed. Overdosage: If you think you have taken too much of this medicine contact a poison control center or emergency room at once. NOTE: This medicine is only for you.  Do not share this medicine with others. What if I miss a dose? It is important not to miss a dose. Call your doctor or health care professional if you are unable to keep an appointment. What may interact with this medicine? -dofetilide -foscarnet -medicines for seizures -medicines to increase blood counts like filgrastim, pegfilgrastim, sargramostim -probenecid -pyridoxine used with altretamine -rituximab -some antibiotics like amikacin, gentamicin, neomycin, polymyxin B, streptomycin, tobramycin -sulfinpyrazone -vaccines -zalcitabine Talk to your doctor or health care professional before taking any of these medicines: -acetaminophen -aspirin -ibuprofen -ketoprofen -naproxen This list may not describe all possible interactions. Give your health care provider a list of all the medicines, herbs, non-prescription drugs, or dietary supplements you use. Also tell them if you smoke, drink alcohol, or use illegal drugs. Some items may interact with your medicine. What should I watch for while using this medicine? Your condition will be monitored carefully while you are receiving this medicine. You will need important blood work done while you are taking this medicine. This drug may make you feel generally unwell. This is not uncommon, as chemotherapy can affect healthy cells as well as cancer cells. Report any side effects. Continue your course of treatment even though you feel ill unless your doctor tells you to stop. In some cases, you may be given additional medicines to help with side effects. Follow all directions for their use. Call your doctor or health care professional for advice if you get a fever, chills or sore throat, or other symptoms of a cold or flu. Do not treat yourself. This drug decreases your body's ability to fight infections. Try to avoid being around people who are sick. This medicine may increase your risk to bruise or bleed. Call your doctor or health care professional  if you notice any unusual bleeding. Be careful brushing and flossing your teeth or using a toothpick because you may get an infection or bleed more easily. If you have any dental work done, tell your dentist you are receiving this medicine. Avoid taking products that contain aspirin, acetaminophen, ibuprofen, naproxen, or ketoprofen unless instructed by your doctor. These medicines may hide a fever. Do not become pregnant while taking this medicine. Women should inform their doctor if they wish to become pregnant or think they might be pregnant. There is a potential for serious side effects to an unborn child. Talk to your health care professional or pharmacist for more information. Do not breast-feed an infant while taking this medicine. Drink fluids as directed while you are taking this medicine. This will help protect your kidneys. Call your doctor or health care professional if you get diarrhea. Do not treat yourself. What side effects  may I notice from receiving this medicine? Side effects that you should report to your doctor or health care professional as soon as possible: -allergic reactions like skin rash, itching or hives, swelling of the face, lips, or tongue -signs of infection - fever or chills, cough, sore throat, pain or difficulty passing urine -signs of decreased platelets or bleeding - bruising, pinpoint red spots on the skin, black, tarry stools, nosebleeds -signs of decreased red blood cells - unusually weak or tired, fainting spells, lightheadedness -breathing problems -changes in hearing -gout pain -low blood counts - This drug may decrease the number of white blood cells, red blood cells and platelets. You may be at increased risk for infections and bleeding. -nausea and vomiting -pain, swelling, redness or irritation at the injection site -pain, tingling, numbness in the hands or feet -problems with balance, movement -trouble passing urine or change in the amount of  urine Side effects that usually do not require medical attention (report to your doctor or health care professional if they continue or are bothersome): -changes in vision -loss of appetite -metallic taste in the mouth or changes in taste This list may not describe all possible side effects. Call your doctor for medical advice about side effects. You may report side effects to FDA at 1-800-FDA-1088. Where should I keep my medicine? This drug is given in a hospital or clinic and will not be stored at home. NOTE: This sheet is a summary. It may not cover all possible information. If you have questions about this medicine, talk to your doctor, pharmacist, or health care provider.  2018 [employer] Standard (2007-09-07 14:40:54)

## 2017-08-03 NOTE — Progress Notes (Signed)
Urine output as noted on flowsheets, along with voiding into toilet and not measured x 2 episodes. dph

## 2017-08-03 NOTE — Progress Notes (Signed)
Hematology and Oncology Follow Up Visit  Daniel Shepherd [id number] 11-29-1995 22 y.o. 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  He has metastatic embryonal cell carcinoma.  He has an elevated beta-hCG of 465.  We treated him with VIP therapy.  He has tolerated this.  He lost a little weight but is now gaining weight.  He has had a little bit of nausea.  He had I think one episode of emesis.  There is been no bleeding.  He has had no mouth sores.  Has lost his hair.  There is been no diarrhea.  Is had no leg swelling.  He has had no rashes.  Overall, I said his performance status is ECOG 1.  Medications:  Current Outpatient Medications:  .  acetaminophen (TYLENOL) 500 MG tablet, Take 500 mg by mouth every 6 (six) hours as needed., Disp: , Rfl:  .  albuterol (PROVENTIL HFA;VENTOLIN HFA) 108 (90 Base) MCG/ACT inhaler, Inhale 1-2 puffs into the lungs every 4 (four) hours as needed for wheezing or shortness of breath., Disp: , Rfl:  .  dexamethasone (DECADRON) 4 MG tablet, Take 2 tablets by mouth once a day on the day after cisplatin chemotherapy and then take 2 tablets 2 times a day for 2 days. Take with food., Disp: 30 tablet, Rfl: 1 .  ibuprofen (ADVIL,MOTRIN) 400 MG tablet, Take 400 mg by mouth every 6 (six) hours as needed., Disp: , Rfl:  .  lidocaine-prilocaine (EMLA) cream, Apply to affected area once, Disp: 30 g, Rfl: 3 .  LORazepam (ATIVAN) 0.5 MG tablet, Take 1 tablet (0.5 mg total) by mouth every 6 (six) hours as needed (Nausea or vomiting)., Disp: 30 tablet, Rfl: 0 .  LORazepam (ATIVAN) 1 MG tablet, Take 1 tablet (1 mg total) by mouth every 8 (eight) hours as needed for anxiety., Disp: 20 tablet, Rfl: 0 .  ondansetron (ZOFRAN) 8 MG tablet, Take 1 tablet (8 mg total) by mouth 2 (two) times  daily as needed. Start on the third day after cisplatin chemotherapy., Disp: 30 tablet, Rfl: 1 .  Oxycodone HCl 10 MG TABS, Take 1 tablet (10 mg total) by mouth every 4 (four) hours as needed. (Patient not taking: Reported on 07/08/2017), Disp: 24 tablet, Rfl: 0 .  oxyCODONE-acetaminophen (PERCOCET) 5-325 MG tablet, Take 1-2 tablets by mouth every 4 (four) hours as needed for severe pain., Disp: 15 tablet, Rfl: 0 .  prochlorperazine (COMPAZINE) 10 MG tablet, Take 1 tablet (10 mg total) by mouth every 6 (six) hours as needed (Nausea or vomiting)., Disp: 30 tablet, Rfl: 1 .  sulfamethoxazole-trimethoprim (BACTRIM DS,SEPTRA DS) 800-160 MG tablet, Take 1 tablet by mouth 2 (two) times daily. For 3 days., Disp: 6 tablet, Rfl: 0 No current facility-administered medications for this visit.   Facility-Administered Medications Ordered in Other Visits:  .  dextrose 5 % and 0.45% NaCl 1,000 mL with potassium chloride 20 mEq, magnesium sulfate 12 mEq infusion, , Intravenous, Once, Marshal Eskew, Rudell Cobb, MD  Allergies: No Known Allergies  Past Medical History, Surgical history, Social history, and Family History were reviewed and updated.  Review of Systems: Review of Systems  Constitutional: Positive for fatigue.  HENT:  Negative.   Eyes: Negative.   Respiratory: Positive for shortness of breath.   Cardiovascular: Negative.  Gastrointestinal: Positive for nausea.  Endocrine: Negative.   Genitourinary: Negative.    Musculoskeletal: Negative.   Skin: Negative.   Neurological: Negative.   Hematological: Negative.   Psychiatric/Behavioral: Negative.     Physical Exam:  vitals were not taken for this visit.   Wt Readings from Last 3 Encounters:  08/03/17 179 lb (81.2 kg)  07/10/17 170 lb (77.1 kg)  07/09/17 170 lb (77.1 kg)    Physical Exam  Constitutional: He is oriented to person, place, and time.  HENT:  Head: Normocephalic and atraumatic.  Mouth/Throat: Oropharynx is clear and moist.  Eyes:  EOM are normal. Pupils are equal, round, and reactive to light.  Neck: Normal range of motion.  Cardiovascular: Normal rate, regular rhythm and normal heart sounds.  Pulmonary/Chest: Effort normal and breath sounds normal.  Abdominal: Soft. Bowel sounds are normal.  Musculoskeletal: Normal range of motion. He exhibits no edema, tenderness or deformity.  Lymphadenopathy:    He has no cervical adenopathy.  Neurological: He is alert and oriented to person, place, and time.  Skin: Skin is warm and dry. No rash noted. No erythema.  Psychiatric: He has a normal mood and affect. His behavior is normal. Judgment and thought content normal.  Vitals reviewed.    Lab Results  Component Value Date   WBC 5.1 08/03/2017   HGB 13.7 07/10/2017   HCT 35.7 (L) 08/03/2017   MCV 93.9 08/03/2017   PLT PENDING 08/03/2017     Chemistry      Component Value Date/Time   NA 138 07/13/2017 0830   K 3.6 07/13/2017 0830   CL 103 07/13/2017 0830   CO2 26 07/13/2017 0830   BUN 17 07/13/2017 0830   CREATININE 0.80 07/13/2017 0830      Component Value Date/Time   CALCIUM 9.0 07/13/2017 0830   ALKPHOS 86 (H) 07/13/2017 0830   AST 26 07/13/2017 0830   ALT 13 07/13/2017 0830   BILITOT 0.5 07/13/2017 0830      Impression and Plan: Daniel Shepherd is a 22 year old white male with metastatic embryonal cell carcinoma.  He had a left orchiectomy.  This was performed back on January 18.  A week later, he showed up in the emergency room with chest discomfort and some shortness of breath.  He had innumerable pulmonary nodules.  His beta-hCG is elevated.  This should be down to below 100 by now.  We will proceed with a second cycle of treatment.  I will get a chest x-ray on him when we finished this cycle.  I would like to think that his disease in the lungs should be gone.  I do not see that we had make any dosage adjustments with treatment.  We will plan to get him back in 3 weeks for his third cycle of treatment.   Hopefully, 4 cycles will be all that he will need.   [name], MD 2/18/20198:46 AM

## 2017-08-04 ENCOUNTER — Ambulatory Visit (HOSPITAL_BASED_OUTPATIENT_CLINIC_OR_DEPARTMENT_OTHER)
Admission: RE | Admit: 2017-08-04 | Discharge: 2017-08-04 | Disposition: A | Discharge status: Home / Self Care | Payer: Managed Care, Other (non HMO) | Source: Ambulatory Visit | Attending: Hematology & Oncology | Admitting: Hematology & Oncology

## 2017-08-04 ENCOUNTER — Inpatient Hospital Stay: Payer: Managed Care, Other (non HMO)

## 2017-08-04 VITALS — BP 134/68 | HR 84 | Temp 97.9°F | Resp 16

## 2017-08-04 DIAGNOSIS — R51 Headache: Secondary | ICD-10-CM | POA: Diagnosis not present

## 2017-08-04 DIAGNOSIS — C6292 Malignant neoplasm of left testis, unspecified whether descended or undescended: Secondary | ICD-10-CM

## 2017-08-04 DIAGNOSIS — Z5111 Encounter for antineoplastic chemotherapy: Secondary | ICD-10-CM | POA: Diagnosis not present

## 2017-08-04 DIAGNOSIS — R918 Other nonspecific abnormal finding of lung field: Secondary | ICD-10-CM | POA: Insufficient documentation

## 2017-08-04 DIAGNOSIS — C7802 Secondary malignant neoplasm of left lung: Secondary | ICD-10-CM

## 2017-08-04 DIAGNOSIS — C801 Malignant (primary) neoplasm, unspecified: Principal | ICD-10-CM

## 2017-08-04 LAB — AFP TUMOR MARKER: AFP, Serum, Tumor Marker: 4.1 ng/mL (ref 0.0–8.3)

## 2017-08-04 LAB — BETA HCG QUANT (REF LAB): hCG Quant: 23 m[IU]/mL — ABNORMAL HIGH (ref 0–3)

## 2017-08-04 MED ORDER — SODIUM CHLORIDE 0.9 % IV SOLN
Freq: Once | INTRAVENOUS | Status: AC
  Administered 2017-08-04: 13:00:00 via INTRAVENOUS
  Filled 2017-08-04: qty 48

## 2017-08-04 MED ORDER — DEXAMETHASONE SODIUM PHOSPHATE 10 MG/ML IJ SOLN
INTRAMUSCULAR | Status: AC
  Filled 2017-08-04: qty 1

## 2017-08-04 MED ORDER — HEPARIN SOD (PORK) LOCK FLUSH 100 UNIT/ML IV SOLN
500.0000 [IU] | Freq: Once | INTRAVENOUS | Status: AC | PRN
  Administered 2017-08-04: 500 [IU]
  Filled 2017-08-04: qty 5

## 2017-08-04 MED ORDER — POTASSIUM CHLORIDE 2 MEQ/ML IV SOLN
Freq: Once | INTRAVENOUS | Status: AC
  Administered 2017-08-04: 09:00:00 via INTRAVENOUS
  Filled 2017-08-04: qty 10

## 2017-08-04 MED ORDER — SODIUM CHLORIDE 0.9 % IV SOLN
Freq: Once | INTRAVENOUS | Status: AC
  Administered 2017-08-04: 09:00:00 via INTRAVENOUS

## 2017-08-04 MED ORDER — DEXAMETHASONE SODIUM PHOSPHATE 10 MG/ML IJ SOLN
10.0000 mg | Freq: Once | INTRAMUSCULAR | Status: AC
  Administered 2017-08-04: 10 mg via INTRAVENOUS

## 2017-08-04 MED ORDER — SODIUM CHLORIDE 0.9 % IV SOLN
75.0000 mg/m2 | Freq: Once | INTRAVENOUS | Status: AC
  Administered 2017-08-04: 150 mg via INTRAVENOUS
  Filled 2017-08-04: qty 7.5

## 2017-08-04 MED ORDER — SODIUM CHLORIDE 0.9% FLUSH
10.0000 mL | INTRAVENOUS | Status: DC | PRN
  Administered 2017-08-04: 10 mL
  Filled 2017-08-04: qty 10

## 2017-08-04 MED ORDER — SODIUM CHLORIDE 0.9 % IV SOLN
120.0000 mg/m2 | Freq: Once | INTRAVENOUS | Status: AC
  Administered 2017-08-04: 250 mg via INTRAVENOUS
  Filled 2017-08-04: qty 2.5

## 2017-08-04 MED ORDER — IOPAMIDOL (ISOVUE-300) INJECTION 61%
100.0000 mL | Freq: Once | INTRAVENOUS | Status: AC | PRN
  Administered 2017-08-04: 100 mL via INTRAVENOUS

## 2017-08-04 MED ORDER — SODIUM CHLORIDE 0.9 % IV SOLN
20.0000 mg/m2 | Freq: Once | INTRAVENOUS | Status: AC
  Administered 2017-08-04: 40 mg via INTRAVENOUS
  Filled 2017-08-04: qty 40

## 2017-08-04 NOTE — Progress Notes (Signed)
Patient late today because he got confused as to how to get here this morning.  Girlfriend states that he was calling their son the wrong name last night.  Patient concerned with confusion he had last night and today.  Dr. Marin Olp notified.  CT brain done.  Results of CT normal.  Dr. Marin Olp ok to proceed with all chemo. Urine output 1000ccs.

## 2017-08-04 NOTE — Addendum Note (Signed)
Addended by: [name] on: 08/04/2017 08:58 AM   Modules accepted: Orders

## 2017-08-04 NOTE — Patient Instructions (Signed)
[facility] [facility] Discharge Instructions for Patients Receiving Chemotherapy  Today you received the following chemotherapy agents Cisplatin, VP16, Ifosphamide  To help prevent nausea and vomiting after your treatment, we encourage you to take your nausea medication    If you develop nausea and vomiting that is not controlled by your nausea medication, call the clinic.   BELOW ARE SYMPTOMS THAT SHOULD BE REPORTED IMMEDIATELY:  *FEVER GREATER THAN 100.5 F  *CHILLS WITH OR WITHOUT FEVER  NAUSEA AND VOMITING THAT IS NOT CONTROLLED WITH YOUR NAUSEA MEDICATION  *UNUSUAL SHORTNESS OF BREATH  *UNUSUAL BRUISING OR BLEEDING  TENDERNESS IN MOUTH AND THROAT WITH OR WITHOUT PRESENCE OF ULCERS  *URINARY PROBLEMS  *BOWEL PROBLEMS  UNUSUAL RASH Items with * indicate a potential emergency and should be followed up as soon as possible.  Feel free to call the clinic should you have any questions or concerns. The clinic phone number is [phone].  Please show the [place] at check-in to the Emergency Department and triage nurse.

## 2017-08-05 ENCOUNTER — Inpatient Hospital Stay: Payer: Managed Care, Other (non HMO)

## 2017-08-05 ENCOUNTER — Other Ambulatory Visit: Payer: Self-pay

## 2017-08-05 VITALS — BP 119/69 | HR 64 | Temp 97.7°F | Resp 20

## 2017-08-05 DIAGNOSIS — C7802 Secondary malignant neoplasm of left lung: Secondary | ICD-10-CM

## 2017-08-05 DIAGNOSIS — Z5111 Encounter for antineoplastic chemotherapy: Secondary | ICD-10-CM | POA: Diagnosis not present

## 2017-08-05 DIAGNOSIS — C801 Malignant (primary) neoplasm, unspecified: Principal | ICD-10-CM

## 2017-08-05 MED ORDER — SODIUM CHLORIDE 0.9 % IV SOLN
Freq: Once | INTRAVENOUS | Status: AC
  Administered 2017-08-05: 09:00:00 via INTRAVENOUS

## 2017-08-05 MED ORDER — PALONOSETRON HCL INJECTION 0.25 MG/5ML
INTRAVENOUS | Status: AC
  Filled 2017-08-05: qty 5

## 2017-08-05 MED ORDER — SODIUM CHLORIDE 0.9% FLUSH
10.0000 mL | INTRAVENOUS | Status: DC | PRN
  Administered 2017-08-05: 10 mL
  Filled 2017-08-05: qty 10

## 2017-08-05 MED ORDER — SODIUM CHLORIDE 0.9 % IV SOLN
75.0000 mg/m2 | Freq: Once | INTRAVENOUS | Status: AC
  Administered 2017-08-05: 150 mg via INTRAVENOUS
  Filled 2017-08-05: qty 7.5

## 2017-08-05 MED ORDER — PALONOSETRON HCL INJECTION 0.25 MG/5ML
0.2500 mg | Freq: Once | INTRAVENOUS | Status: AC
  Administered 2017-08-05: 0.25 mg via INTRAVENOUS

## 2017-08-05 MED ORDER — CISPLATIN CHEMO INJECTION 100MG/100ML
20.0000 mg/m2 | Freq: Once | INTRAVENOUS | Status: AC
  Administered 2017-08-05: 40 mg via INTRAVENOUS
  Filled 2017-08-05: qty 40

## 2017-08-05 MED ORDER — HEPARIN SOD (PORK) LOCK FLUSH 100 UNIT/ML IV SOLN
500.0000 [IU] | Freq: Once | INTRAVENOUS | Status: AC | PRN
  Administered 2017-08-05: 500 [IU]
  Filled 2017-08-05: qty 5

## 2017-08-05 MED ORDER — POTASSIUM CHLORIDE 2 MEQ/ML IV SOLN
Freq: Once | INTRAVENOUS | Status: AC
  Administered 2017-08-05: 09:00:00 via INTRAVENOUS
  Filled 2017-08-05: qty 10

## 2017-08-05 MED ORDER — SODIUM CHLORIDE 0.9 % IV SOLN
Freq: Once | INTRAVENOUS | Status: AC
  Administered 2017-08-05: 10:00:00 via INTRAVENOUS
  Filled 2017-08-05: qty 5

## 2017-08-05 MED ORDER — SODIUM CHLORIDE 0.9 % IV SOLN
120.0000 mg/m2 | Freq: Once | INTRAVENOUS | Status: AC
  Administered 2017-08-05: 250 mg via INTRAVENOUS
  Filled 2017-08-05: qty 2.5

## 2017-08-05 MED ORDER — SODIUM CHLORIDE 0.9 % IV SOLN
Freq: Once | INTRAVENOUS | Status: AC
  Administered 2017-08-05: 13:00:00 via INTRAVENOUS
  Filled 2017-08-05: qty 48

## 2017-08-05 NOTE — Patient Instructions (Signed)
[facility] Discharge Instructions for Patients Receiving Chemotherapy  Today you received the following chemotherapy agents Cisplatin, VP16, Ifosphamide  To help prevent nausea and vomiting after your treatment, we encourage you to take your nausea medication    If you develop nausea and vomiting that is not controlled by your nausea medication, call the clinic.   BELOW ARE SYMPTOMS THAT SHOULD BE REPORTED IMMEDIATELY:  *FEVER GREATER THAN 100.5 F  *CHILLS WITH OR WITHOUT FEVER  NAUSEA AND VOMITING THAT IS NOT CONTROLLED WITH YOUR NAUSEA MEDICATION  *UNUSUAL SHORTNESS OF BREATH  *UNUSUAL BRUISING OR BLEEDING  TENDERNESS IN MOUTH AND THROAT WITH OR WITHOUT PRESENCE OF ULCERS  *URINARY PROBLEMS  *BOWEL PROBLEMS  UNUSUAL RASH Items with * indicate a potential emergency and should be followed up as soon as possible.  Feel free to call the clinic should you have any questions or concerns. The clinic phone number is [phone].  Please show the [place] at check-in to the Emergency Department and triage nurse.

## 2017-08-06 ENCOUNTER — Inpatient Hospital Stay: Payer: Managed Care, Other (non HMO)

## 2017-08-06 VITALS — BP 118/79 | HR 66 | Temp 98.4°F | Resp 20

## 2017-08-06 DIAGNOSIS — C801 Malignant (primary) neoplasm, unspecified: Principal | ICD-10-CM

## 2017-08-06 DIAGNOSIS — Z5111 Encounter for antineoplastic chemotherapy: Secondary | ICD-10-CM | POA: Diagnosis not present

## 2017-08-06 DIAGNOSIS — C7802 Secondary malignant neoplasm of left lung: Secondary | ICD-10-CM

## 2017-08-06 MED ORDER — DEXAMETHASONE SODIUM PHOSPHATE 10 MG/ML IJ SOLN
INTRAMUSCULAR | Status: AC
  Filled 2017-08-06: qty 1

## 2017-08-06 MED ORDER — DEXAMETHASONE SODIUM PHOSPHATE 10 MG/ML IJ SOLN
10.0000 mg | Freq: Once | INTRAMUSCULAR | Status: AC
  Administered 2017-08-06: 10 mg via INTRAVENOUS

## 2017-08-06 MED ORDER — SODIUM CHLORIDE 0.9 % IV SOLN
20.0000 mg/m2 | Freq: Once | INTRAVENOUS | Status: AC
  Administered 2017-08-06: 40 mg via INTRAVENOUS
  Filled 2017-08-06: qty 40

## 2017-08-06 MED ORDER — SODIUM CHLORIDE 0.9 % IV SOLN
75.0000 mg/m2 | Freq: Once | INTRAVENOUS | Status: AC
  Administered 2017-08-06: 150 mg via INTRAVENOUS
  Filled 2017-08-06: qty 7.5

## 2017-08-06 MED ORDER — PROCHLORPERAZINE MALEATE 10 MG PO TABS
10.0000 mg | ORAL_TABLET | Freq: Once | ORAL | Status: AC
  Administered 2017-08-06: 10 mg via ORAL

## 2017-08-06 MED ORDER — SODIUM CHLORIDE 0.9% FLUSH
10.0000 mL | INTRAVENOUS | Status: DC | PRN
  Administered 2017-08-06: 10 mL
  Filled 2017-08-06: qty 10

## 2017-08-06 MED ORDER — SODIUM CHLORIDE 0.9 % IV SOLN
Freq: Once | INTRAVENOUS | Status: AC
  Administered 2017-08-06: 09:00:00 via INTRAVENOUS

## 2017-08-06 MED ORDER — SODIUM CHLORIDE 0.9 % IV SOLN
Freq: Once | INTRAVENOUS | Status: AC
  Administered 2017-08-06: 12:00:00 via INTRAVENOUS
  Filled 2017-08-06: qty 48

## 2017-08-06 MED ORDER — SODIUM CHLORIDE 0.9 % IV SOLN
120.0000 mg/m2 | Freq: Once | INTRAVENOUS | Status: AC
  Administered 2017-08-06: 250 mg via INTRAVENOUS
  Filled 2017-08-06: qty 2.5

## 2017-08-06 MED ORDER — PROCHLORPERAZINE MALEATE 10 MG PO TABS
ORAL_TABLET | ORAL | Status: AC
  Filled 2017-08-06: qty 1

## 2017-08-06 MED ORDER — POTASSIUM CHLORIDE 2 MEQ/ML IV SOLN
Freq: Once | INTRAVENOUS | Status: AC
  Administered 2017-08-06: 09:00:00 via INTRAVENOUS
  Filled 2017-08-06: qty 10

## 2017-08-06 MED ORDER — HEPARIN SOD (PORK) LOCK FLUSH 100 UNIT/ML IV SOLN
500.0000 [IU] | Freq: Once | INTRAVENOUS | Status: AC | PRN
  Administered 2017-08-06: 500 [IU]
  Filled 2017-08-06: qty 5

## 2017-08-06 NOTE — Patient Instructions (Signed)
[facility] Discharge Instructions for Patients Receiving Chemotherapy  Today you received the following chemotherapy agents VP16, Cisplatin Ifophamide  To help prevent nausea and vomiting after your treatment, we encourage you to take your nausea medication    If you develop nausea and vomiting that is not controlled by your nausea medication, call the clinic.   BELOW ARE SYMPTOMS THAT SHOULD BE REPORTED IMMEDIATELY:  *FEVER GREATER THAN 100.5 F  *CHILLS WITH OR WITHOUT FEVER  NAUSEA AND VOMITING THAT IS NOT CONTROLLED WITH YOUR NAUSEA MEDICATION  *UNUSUAL SHORTNESS OF BREATH  *UNUSUAL BRUISING OR BLEEDING  TENDERNESS IN MOUTH AND THROAT WITH OR WITHOUT PRESENCE OF ULCERS  *URINARY PROBLEMS  *BOWEL PROBLEMS  UNUSUAL RASH Items with * indicate a potential emergency and should be followed up as soon as possible.  Feel free to call the clinic should you have any questions or concerns. The clinic phone number is [phone].  Please show the [place] at check-in to the Emergency Department and triage nurse.

## 2017-08-06 NOTE — Progress Notes (Signed)
Still some transient confusion noted.  Patient states not any worse than the past few days.  Dr. Marin Olp notified.  Proceed with chemo per Dr. Marin Olp

## 2017-08-07 ENCOUNTER — Telehealth: Payer: Self-pay | Admitting: Pharmacist

## 2017-08-07 ENCOUNTER — Inpatient Hospital Stay: Payer: Managed Care, Other (non HMO)

## 2017-08-07 VITALS — BP 125/73 | HR 63 | Temp 98.3°F | Resp 20

## 2017-08-07 DIAGNOSIS — Z5111 Encounter for antineoplastic chemotherapy: Secondary | ICD-10-CM | POA: Diagnosis not present

## 2017-08-07 DIAGNOSIS — C7802 Secondary malignant neoplasm of left lung: Secondary | ICD-10-CM

## 2017-08-07 DIAGNOSIS — C801 Malignant (primary) neoplasm, unspecified: Principal | ICD-10-CM

## 2017-08-07 MED ORDER — SODIUM CHLORIDE 0.9% FLUSH
10.0000 mL | INTRAVENOUS | Status: DC | PRN
  Administered 2017-08-07: 10 mL
  Filled 2017-08-07: qty 10

## 2017-08-07 MED ORDER — SODIUM CHLORIDE 0.9 % IV SOLN
75.0000 mg/m2 | Freq: Once | INTRAVENOUS | Status: AC
  Administered 2017-08-07: 150 mg via INTRAVENOUS
  Filled 2017-08-07: qty 7.5

## 2017-08-07 MED ORDER — PEGFILGRASTIM 6 MG/0.6ML ~~LOC~~ PSKT
6.0000 mg | PREFILLED_SYRINGE | Freq: Once | SUBCUTANEOUS | Status: AC
  Administered 2017-08-07: 6 mg via SUBCUTANEOUS

## 2017-08-07 MED ORDER — FOSAPREPITANT DIMEGLUMINE INJECTION 150 MG
Freq: Once | INTRAVENOUS | Status: AC
  Administered 2017-08-07: 10:00:00 via INTRAVENOUS
  Filled 2017-08-07: qty 5

## 2017-08-07 MED ORDER — HEPARIN SOD (PORK) LOCK FLUSH 100 UNIT/ML IV SOLN
500.0000 [IU] | Freq: Once | INTRAVENOUS | Status: AC | PRN
  Administered 2017-08-07: 500 [IU]
  Filled 2017-08-07: qty 5

## 2017-08-07 MED ORDER — SODIUM CHLORIDE 0.9 % IV SOLN
120.0000 mg/m2 | Freq: Once | INTRAVENOUS | Status: AC
  Administered 2017-08-07: 250 mg via INTRAVENOUS
  Filled 2017-08-07: qty 2.5

## 2017-08-07 MED ORDER — SODIUM CHLORIDE 0.9 % IV SOLN
Freq: Once | INTRAVENOUS | Status: AC
  Administered 2017-08-07: 13:00:00 via INTRAVENOUS
  Filled 2017-08-07: qty 48

## 2017-08-07 MED ORDER — SODIUM CHLORIDE 0.9 % IV SOLN
20.0000 mg/m2 | Freq: Once | INTRAVENOUS | Status: AC
  Administered 2017-08-07: 40 mg via INTRAVENOUS
  Filled 2017-08-07: qty 40

## 2017-08-07 MED ORDER — POTASSIUM CHLORIDE 2 MEQ/ML IV SOLN
Freq: Once | INTRAVENOUS | Status: AC
  Administered 2017-08-07: 09:00:00 via INTRAVENOUS
  Filled 2017-08-07: qty 10

## 2017-08-07 MED ORDER — PALONOSETRON HCL INJECTION 0.25 MG/5ML
INTRAVENOUS | Status: AC
  Filled 2017-08-07: qty 5

## 2017-08-07 MED ORDER — PALONOSETRON HCL INJECTION 0.25 MG/5ML
0.2500 mg | Freq: Once | INTRAVENOUS | Status: AC
  Administered 2017-08-07: 0.25 mg via INTRAVENOUS

## 2017-08-07 MED ORDER — SODIUM CHLORIDE 0.9 % IV SOLN
Freq: Once | INTRAVENOUS | Status: AC
  Administered 2017-08-07: 08:00:00 via INTRAVENOUS

## 2017-08-07 MED ORDER — PEGFILGRASTIM 6 MG/0.6ML ~~LOC~~ PSKT
PREFILLED_SYRINGE | SUBCUTANEOUS | Status: AC
  Filled 2017-08-07: qty 0.6

## 2017-08-07 MED FILL — PROCHLORPERAZINE 10 MG TAB: 10 | 8 days supply | Qty: 30 | Fill #0

## 2017-08-07 NOTE — Telephone Encounter (Signed)
Sample given: [place] patch #1 Lot:  [id number] Exp:  10/20

## 2017-08-24 ENCOUNTER — Inpatient Hospital Stay: Payer: Managed Care, Other (non HMO)

## 2017-08-24 ENCOUNTER — Encounter: Payer: Self-pay | Admitting: Hematology & Oncology

## 2017-08-24 ENCOUNTER — Other Ambulatory Visit: Payer: Self-pay

## 2017-08-24 ENCOUNTER — Other Ambulatory Visit: Payer: Self-pay | Admitting: *Deleted

## 2017-08-24 ENCOUNTER — Inpatient Hospital Stay: Payer: Managed Care, Other (non HMO) | Attending: Hematology & Oncology | Admitting: Hematology & Oncology

## 2017-08-24 VITALS — Wt 182.0 lb

## 2017-08-24 VITALS — BP 121/45 | HR 72 | Temp 97.5°F | Resp 20 | Wt 182.2 lb

## 2017-08-24 DIAGNOSIS — Z5111 Encounter for antineoplastic chemotherapy: Secondary | ICD-10-CM | POA: Diagnosis not present

## 2017-08-24 DIAGNOSIS — C6292 Malignant neoplasm of left testis, unspecified whether descended or undescended: Secondary | ICD-10-CM | POA: Insufficient documentation

## 2017-08-24 DIAGNOSIS — C801 Malignant (primary) neoplasm, unspecified: Principal | ICD-10-CM

## 2017-08-24 DIAGNOSIS — C7800 Secondary malignant neoplasm of unspecified lung: Secondary | ICD-10-CM | POA: Insufficient documentation

## 2017-08-24 DIAGNOSIS — C7802 Secondary malignant neoplasm of left lung: Secondary | ICD-10-CM

## 2017-08-24 LAB — CBC WITH DIFFERENTIAL (CANCER CENTER ONLY)
Basophils Absolute: 0.1 K/uL (ref 0.0–0.1)
Basophils Relative: 1 %
Eosinophils Absolute: 0 K/uL (ref 0.0–0.5)
Eosinophils Relative: 0 %
HCT: 36.4 % — ABNORMAL LOW (ref 38.7–49.9)
Hemoglobin: 12.3 g/dL — ABNORMAL LOW (ref 13.0–17.1)
Lymphocytes Relative: 25 %
Lymphs Abs: 1.9 K/uL (ref 0.9–3.3)
MCH: 32.4 pg (ref 28.0–33.4)
MCHC: 33.8 g/dL (ref 32.0–35.9)
MCV: 95.8 fL (ref 82.0–98.0)
Monocytes Absolute: 0.7 K/uL (ref 0.1–0.9)
Monocytes Relative: 9 %
Neutro Abs: 5.1 K/uL (ref 1.5–6.5)
Neutrophils Relative %: 65 %
Platelet Count: 114 K/uL — ABNORMAL LOW (ref 145–400)
RBC: 3.8 MIL/uL — ABNORMAL LOW (ref 4.20–5.70)
RDW: 16.5 % — ABNORMAL HIGH (ref 11.1–15.7)
WBC Count: 7.8 K/uL (ref 4.0–10.0)

## 2017-08-24 LAB — CMP (CANCER CENTER ONLY)
ALT: 28 U/L (ref 10–47)
AST: 21 U/L (ref 11–38)
Albumin: 3.3 g/dL — ABNORMAL LOW (ref 3.5–5.0)
Alkaline Phosphatase: 114 U/L — ABNORMAL HIGH (ref 26–84)
Anion gap: 3 — ABNORMAL LOW (ref 5–15)
BUN: 14 mg/dL (ref 7–22)
CO2: 31 mmol/L (ref 18–33)
Calcium: 8.8 mg/dL (ref 8.0–10.3)
Chloride: 103 mmol/L (ref 98–108)
Creatinine: 0.4 mg/dL — ABNORMAL LOW (ref 0.60–1.20)
Glucose, Bld: 94 mg/dL (ref 73–118)
Potassium: 3.6 mmol/L (ref 3.3–4.7)
Sodium: 137 mmol/L (ref 128–145)
Total Bilirubin: 0.6 mg/dL (ref 0.2–1.6)
Total Protein: 6.1 g/dL — ABNORMAL LOW (ref 6.4–8.1)

## 2017-08-24 LAB — LACTATE DEHYDROGENASE: LDH: 202 U/L (ref 125–245)

## 2017-08-24 MED ORDER — DEXAMETHASONE 4 MG PO TABS: ORAL_TABLET | ORAL | 1 refills | Status: DC

## 2017-08-24 MED ORDER — HEPARIN SOD (PORK) LOCK FLUSH 100 UNIT/ML IV SOLN
500.0000 [IU] | Freq: Once | INTRAVENOUS | Status: AC | PRN
  Administered 2017-08-24: 500 [IU]
  Filled 2017-08-24: qty 5

## 2017-08-24 MED ORDER — SODIUM CHLORIDE 0.9 % IV SOLN
20.0000 mg/m2 | Freq: Once | INTRAVENOUS | Status: AC
  Administered 2017-08-24: 40 mg via INTRAVENOUS
  Filled 2017-08-24: qty 40

## 2017-08-24 MED ORDER — SODIUM CHLORIDE 0.9 % IV SOLN
Freq: Once | INTRAVENOUS | Status: AC
  Administered 2017-08-24: 13:00:00 via INTRAVENOUS
  Filled 2017-08-24: qty 48

## 2017-08-24 MED ORDER — POTASSIUM CHLORIDE 2 MEQ/ML IV SOLN
Freq: Once | INTRAVENOUS | Status: AC
  Administered 2017-08-24: 08:00:00 via INTRAVENOUS
  Filled 2017-08-24: qty 10

## 2017-08-24 MED ORDER — SODIUM CHLORIDE 0.9 % IV SOLN
Freq: Once | INTRAVENOUS | Status: AC
  Administered 2017-08-24: 10:00:00 via INTRAVENOUS
  Filled 2017-08-24: qty 5

## 2017-08-24 MED ORDER — PALONOSETRON HCL INJECTION 0.25 MG/5ML
INTRAVENOUS | Status: AC
  Filled 2017-08-24: qty 5

## 2017-08-24 MED ORDER — SODIUM CHLORIDE 0.9 % IV SOLN
Freq: Once | INTRAVENOUS | Status: AC
  Administered 2017-08-24: 08:00:00 via INTRAVENOUS

## 2017-08-24 MED ORDER — PALONOSETRON HCL INJECTION 0.25 MG/5ML
0.2500 mg | Freq: Once | INTRAVENOUS | Status: AC
  Administered 2017-08-24: 0.25 mg via INTRAVENOUS

## 2017-08-24 MED ORDER — SODIUM CHLORIDE 0.9 % IV SOLN
120.0000 mg/m2 | Freq: Once | INTRAVENOUS | Status: AC
  Administered 2017-08-24: 250 mg via INTRAVENOUS
  Filled 2017-08-24: qty 2.5

## 2017-08-24 MED ORDER — PROCHLORPERAZINE MALEATE 10 MG PO TABS: 10.0000 mg | ORAL_TABLET | Freq: Four times a day (QID) | ORAL | 1 refills | Status: DC | PRN

## 2017-08-24 MED ORDER — SODIUM CHLORIDE 0.9 % IV SOLN
75.0000 mg/m2 | Freq: Once | INTRAVENOUS | Status: AC
  Administered 2017-08-24: 150 mg via INTRAVENOUS
  Filled 2017-08-24: qty 7.5

## 2017-08-24 MED ORDER — SODIUM CHLORIDE 0.9% FLUSH
10.0000 mL | INTRAVENOUS | Status: DC | PRN
  Administered 2017-08-24: 10 mL
  Filled 2017-08-24: qty 10

## 2017-08-24 MED FILL — DEXAMETHASONE 4 MG TABLET: 4 | 8 days supply | Qty: 30 | Fill #0

## 2017-08-24 MED FILL — PROCHLORPERAZINE 10 MG TAB: 10 | 7 days supply | Qty: 30 | Fill #0

## 2017-08-24 NOTE — Progress Notes (Signed)
Hematology and Oncology Follow Up Visit  Daniel Shepherd [id number] December 02, 1995 22 y.o. 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  He has metastatic embryonal cell carcinoma.  He has an elevated beta-hCG of 465.  However, after his first cycle of treatment, his beta-hCG went down to 48.  He really did well with his second cycle of treatment.  He does feel tired.  He is actually gained some weight.  He is eating quite well.  He has had no cough or shortness of breath.  We did do a chest x-ray Shepherd him with his  second cycle of treatment.  Most of his pulmonary nodules were already gone.  He has no tinnitus.  He is had no change in bowel or bladder habits.  Is no rashes.  He has had no abdominal pain.  He has had a little nausea but no vomiting.  Overall, his performance status is ECOG 1.  Medications:  Current Outpatient Medications:  .  acetaminophen (TYLENOL) 500 MG tablet, Take 500 mg by mouth every 6 (six) hours as needed., Disp: , Rfl:  .  albuterol (PROVENTIL HFA;VENTOLIN HFA) 108 (90 Base) MCG/ACT inhaler, Inhale 1-2 puffs into the lungs every 4 (four) hours as needed for wheezing or shortness of breath., Disp: , Rfl:  .  dexamethasone (DECADRON) 4 MG tablet, Take 2 tablets by mouth once a day Shepherd the day after cisplatin chemotherapy and then take 2 tablets 2 times a day for 2 days. Take with food., Disp: 30 tablet, Rfl: 1 .  ibuprofen (ADVIL,MOTRIN) 400 MG tablet, Take 400 mg by mouth every 6 (six) hours as needed., Disp: , Rfl:  .  lidocaine-prilocaine (EMLA) cream, Apply to affected area once, Disp: 30 g, Rfl: 3 .  LORazepam (ATIVAN) 0.5 MG tablet, Take 1 tablet (0.5 mg total) by mouth every 6 (six) hours as needed (Nausea or vomiting)., Disp: 30 tablet, Rfl: 0 .  LORazepam (ATIVAN) 1 MG tablet,  Take 1 tablet (1 mg total) by mouth every 8 (eight) hours as needed for anxiety., Disp: 20 tablet, Rfl: 0 .  ondansetron (ZOFRAN) 8 MG tablet, Take 1 tablet (8 mg total) by mouth 2 (two) times daily as needed. Start Shepherd the third day after cisplatin chemotherapy., Disp: 30 tablet, Rfl: 1 .  Oxycodone HCl 10 MG TABS, Take 1 tablet (10 mg total) by mouth every 4 (four) hours as needed. (Patient not taking: Reported Shepherd 07/08/2017), Disp: 24 tablet, Rfl: 0 .  oxyCODONE-acetaminophen (PERCOCET) 5-325 MG tablet, Take 1-2 tablets by mouth every 4 (four) hours as needed for severe pain., Disp: 15 tablet, Rfl: 0 .  prochlorperazine (COMPAZINE) 10 MG tablet, Take 1 tablet (10 mg total) by mouth every 6 (six) hours as needed (Nausea or vomiting)., Disp: 30 tablet, Rfl: 1 .  promethazine (PHENERGAN) 25 MG tablet, TK 1 T PO Q 4 TO 6 H, Disp: , Rfl: 0 .  sulfamethoxazole-trimethoprim (BACTRIM DS,SEPTRA DS) 800-160 MG tablet, Take 1 tablet by mouth 2 (two) times daily. For 3 days., Disp: 6 tablet, Rfl: 0  Allergies: No Known Allergies  Past Medical History, Surgical history, Social history, and Family History were reviewed and updated.  Review of Systems: Review of Systems  Constitutional: Positive for fatigue.  HENT:  Negative.   Eyes: Negative.  Respiratory: Positive for shortness of breath.   Cardiovascular: Negative.   Gastrointestinal: Positive for nausea.  Endocrine: Negative.   Genitourinary: Negative.    Musculoskeletal: Negative.   Skin: Negative.   Neurological: Negative.   Hematological: Negative.   Psychiatric/Behavioral: Negative.     Physical Exam:  weight is 182 lb (82.6 kg).   Wt Readings from Last 3 Encounters:  08/24/17 182 lb 4 oz (82.7 kg)  08/24/17 182 lb (82.6 kg)  08/03/17 179 lb (81.2 kg)    Physical Exam  Constitutional: He is oriented to person, place, and time.  HENT:  Head: Normocephalic and atraumatic.  Mouth/Throat: Oropharynx is clear and moist.  Eyes: EOM  are normal. Pupils are equal, round, and reactive to light.  Neck: Normal range of motion.  Cardiovascular: Normal rate, regular rhythm and normal heart sounds.  Pulmonary/Chest: Effort normal and breath sounds normal.  Abdominal: Soft. Bowel sounds are normal.  Musculoskeletal: Normal range of motion. He exhibits no edema, tenderness or deformity.  Lymphadenopathy:    He has no cervical adenopathy.  Neurological: He is alert and oriented to person, place, and time.  Skin: Skin is warm and dry. No rash noted. No erythema.  Psychiatric: He has a normal mood and affect. His behavior is normal. Judgment and thought content normal.  Vitals reviewed.    Lab Results  Component Value Date   WBC 7.8 08/24/2017   HGB 13.7 07/10/2017   HCT 36.4 (L) 08/24/2017   MCV 95.8 08/24/2017   PLT 114 (L) 08/24/2017     Chemistry      Component Value Date/Time   NA 139 08/03/2017 0810   K 4.3 08/03/2017 0810   CL 107 08/03/2017 0810   CO2 27 08/03/2017 0810   BUN 13 08/03/2017 0810   CREATININE 0.70 08/03/2017 0810      Component Value Date/Time   CALCIUM 8.6 08/03/2017 0810   ALKPHOS 98 (H) 08/03/2017 0810   AST 21 08/03/2017 0810   ALT 27 08/03/2017 0810   BILITOT 0.5 08/03/2017 0810      Impression and Plan: Daniel Shepherd is a 22 year old white male with metastatic embryonal cell carcinoma.  He had a left orchiectomy.  This was performed back Shepherd January 18.  A week later, he showed up in the emergency room with chest discomfort and some shortness of breath.  He had innumerable pulmonary nodules.  Hopefully, we will find that his beta-hCG is less than 10.  We will go ahead and get him set up with CT scans about 2 weeks after his third cycle of treatment.  I anticipate that this will be his last cycle of treatment.  If his beta-hCG is not below 10, then I think we may have to give him one more cycle.  He really wants to have the Port-A-Cath taken out.  I told him that we will get this  taken out once I know that he will not require any more treatment.  I will plan to get him back to see me in another 3 weeks.  Daniel Napoleon, MD 3/11/20198:56 AM

## 2017-08-24 NOTE — Progress Notes (Signed)
OK to run pre and post hydration at 500 ml/hr per Dr. Marin Olp.

## 2017-08-24 NOTE — Addendum Note (Signed)
Addended by: Burney Gauze R on: 08/24/2017 10:01 AM   Modules accepted: Orders

## 2017-08-24 NOTE — Patient Instructions (Signed)
[facility] Discharge Instructions for Patients Receiving Chemotherapy  Today you received the following chemotherapy agents:  VP-16, [place], Ifosfamide and Cisplatin  To help prevent nausea and vomiting after your treatment, we encourage you to take your nausea medication as ordered per MD.    If you develop nausea and vomiting that is not controlled by your nausea medication, call the clinic.   BELOW ARE SYMPTOMS THAT SHOULD BE REPORTED IMMEDIATELY:  *FEVER GREATER THAN 100.5 F  *CHILLS WITH OR WITHOUT FEVER  NAUSEA AND VOMITING THAT IS NOT CONTROLLED WITH YOUR NAUSEA MEDICATION  *UNUSUAL SHORTNESS OF BREATH  *UNUSUAL BRUISING OR BLEEDING  TENDERNESS IN MOUTH AND THROAT WITH OR WITHOUT PRESENCE OF ULCERS  *URINARY PROBLEMS  *BOWEL PROBLEMS  UNUSUAL RASH Items with * indicate a potential emergency and should be followed up as soon as possible.  Feel free to call the clinic should you have any questions or concerns. The clinic phone number is [phone].  Please show the [place] at check-in to the Emergency Department and triage nurse.

## 2017-08-25 ENCOUNTER — Other Ambulatory Visit: Payer: Self-pay

## 2017-08-25 ENCOUNTER — Inpatient Hospital Stay: Payer: Managed Care, Other (non HMO)

## 2017-08-25 VITALS — BP 133/66 | HR 71 | Temp 97.9°F | Resp 18

## 2017-08-25 DIAGNOSIS — C7802 Secondary malignant neoplasm of left lung: Secondary | ICD-10-CM

## 2017-08-25 DIAGNOSIS — C801 Malignant (primary) neoplasm, unspecified: Principal | ICD-10-CM

## 2017-08-25 DIAGNOSIS — Z5111 Encounter for antineoplastic chemotherapy: Secondary | ICD-10-CM | POA: Diagnosis not present

## 2017-08-25 LAB — BETA HCG QUANT (REF LAB): hCG Quant: <1 m[IU]/mL (ref 0–3)

## 2017-08-25 LAB — AFP TUMOR MARKER: AFP, Serum, Tumor Marker: 3.9 ng/mL (ref 0.0–8.3)

## 2017-08-25 MED ORDER — POTASSIUM CHLORIDE 2 MEQ/ML IV SOLN
Freq: Once | INTRAVENOUS | Status: AC
  Administered 2017-08-25: 08:00:00 via INTRAVENOUS
  Filled 2017-08-25: qty 10

## 2017-08-25 MED ORDER — HEPARIN SOD (PORK) LOCK FLUSH 100 UNIT/ML IV SOLN
500.0000 [IU] | Freq: Once | INTRAVENOUS | Status: AC | PRN
  Administered 2017-08-25: 500 [IU]
  Filled 2017-08-25: qty 5

## 2017-08-25 MED ORDER — DEXAMETHASONE SODIUM PHOSPHATE 10 MG/ML IJ SOLN
10.0000 mg | Freq: Once | INTRAMUSCULAR | Status: AC
  Administered 2017-08-25: 10 mg via INTRAVENOUS

## 2017-08-25 MED ORDER — DEXAMETHASONE SODIUM PHOSPHATE 10 MG/ML IJ SOLN
INTRAMUSCULAR | Status: AC
  Filled 2017-08-25: qty 1

## 2017-08-25 MED ORDER — SODIUM CHLORIDE 0.9 % IV SOLN
20.0000 mg/m2 | Freq: Once | INTRAVENOUS | Status: AC
  Administered 2017-08-25: 40 mg via INTRAVENOUS
  Filled 2017-08-25: qty 40

## 2017-08-25 MED ORDER — PALONOSETRON HCL INJECTION 0.25 MG/5ML
INTRAVENOUS | Status: AC
  Filled 2017-08-25: qty 5

## 2017-08-25 MED ORDER — SODIUM CHLORIDE 0.9 % IV SOLN
Freq: Once | INTRAVENOUS | Status: AC
  Administered 2017-08-25: 08:00:00 via INTRAVENOUS

## 2017-08-25 MED ORDER — MESNA 100 MG/ML IV SOLN
120.0000 mg/m2 | Freq: Once | INTRAVENOUS | Status: AC
  Administered 2017-08-25: 250 mg via INTRAVENOUS
  Filled 2017-08-25: qty 2.5

## 2017-08-25 MED ORDER — SODIUM CHLORIDE 0.9% FLUSH
10.0000 mL | INTRAVENOUS | Status: DC | PRN
  Administered 2017-08-25: 10 mL
  Filled 2017-08-25: qty 10

## 2017-08-25 MED ORDER — SODIUM CHLORIDE 0.9 % IV SOLN
75.0000 mg/m2 | Freq: Once | INTRAVENOUS | Status: AC
  Administered 2017-08-25: 150 mg via INTRAVENOUS
  Filled 2017-08-25: qty 7.5

## 2017-08-25 MED ORDER — SODIUM CHLORIDE 0.9 % IV SOLN
Freq: Once | INTRAVENOUS | Status: AC
  Administered 2017-08-25: 12:00:00 via INTRAVENOUS
  Filled 2017-08-25: qty 48

## 2017-08-25 NOTE — Patient Instructions (Signed)
[facility] Discharge Instructions for Patients Receiving Chemotherapy  Today you received the following chemotherapy agents Cisplatin/VP 16/[place]es[place]/IFEX To help prevent nausea and vomiting after your treatment, we encourage you to take your nausea medication as prescribed.    If you develop nausea and vomiting that is not controlled by your nausea medication, call the clinic.   BELOW ARE SYMPTOMS THAT SHOULD BE REPORTED IMMEDIATELY:  *FEVER GREATER THAN 100.5 F  *CHILLS WITH OR WITHOUT FEVER  NAUSEA AND VOMITING THAT IS NOT CONTROLLED WITH YOUR NAUSEA MEDICATION  *UNUSUAL SHORTNESS OF BREATH  *UNUSUAL BRUISING OR BLEEDING  TENDERNESS IN MOUTH AND THROAT WITH OR WITHOUT PRESENCE OF ULCERS  *URINARY PROBLEMS  *BOWEL PROBLEMS  UNUSUAL RASH Items with * indicate a potential emergency and should be followed up as soon as possible.  Feel free to call the clinic should you have any questions or concerns. The clinic phone number is [phone].  Please show the [place] at check-in to the Emergency Department and triage nurse.

## 2017-08-26 ENCOUNTER — Other Ambulatory Visit: Payer: Self-pay

## 2017-08-26 ENCOUNTER — Inpatient Hospital Stay: Payer: Managed Care, Other (non HMO)

## 2017-08-26 VITALS — BP 136/77 | HR 88 | Temp 97.7°F | Resp 20

## 2017-08-26 DIAGNOSIS — C801 Malignant (primary) neoplasm, unspecified: Principal | ICD-10-CM

## 2017-08-26 DIAGNOSIS — C7802 Secondary malignant neoplasm of left lung: Secondary | ICD-10-CM

## 2017-08-26 DIAGNOSIS — Z5111 Encounter for antineoplastic chemotherapy: Secondary | ICD-10-CM | POA: Diagnosis not present

## 2017-08-26 MED ORDER — SODIUM CHLORIDE 0.9 % IV SOLN
20.0000 mg/m2 | Freq: Once | INTRAVENOUS | Status: AC
  Administered 2017-08-26: 40 mg via INTRAVENOUS
  Filled 2017-08-26: qty 40

## 2017-08-26 MED ORDER — PALONOSETRON HCL INJECTION 0.25 MG/5ML
0.2500 mg | Freq: Once | INTRAVENOUS | Status: AC
  Administered 2017-08-26: 0.25 mg via INTRAVENOUS

## 2017-08-26 MED ORDER — SODIUM CHLORIDE 0.9 % IV SOLN
120.0000 mg/m2 | Freq: Once | INTRAVENOUS | Status: AC
  Administered 2017-08-26: 250 mg via INTRAVENOUS
  Filled 2017-08-26: qty 2.5

## 2017-08-26 MED ORDER — FOSAPREPITANT DIMEGLUMINE INJECTION 150 MG
Freq: Once | INTRAVENOUS | Status: AC
  Administered 2017-08-26: 10:00:00 via INTRAVENOUS
  Filled 2017-08-26: qty 5

## 2017-08-26 MED ORDER — SODIUM CHLORIDE 0.9 % IV SOLN
Freq: Once | INTRAVENOUS | Status: AC
  Administered 2017-08-26: 12:00:00 via INTRAVENOUS
  Filled 2017-08-26: qty 48

## 2017-08-26 MED ORDER — POTASSIUM CHLORIDE 2 MEQ/ML IV SOLN
Freq: Once | INTRAVENOUS | Status: AC
  Administered 2017-08-26: 09:00:00 via INTRAVENOUS
  Filled 2017-08-26: qty 10

## 2017-08-26 MED ORDER — PALONOSETRON HCL INJECTION 0.25 MG/5ML
INTRAVENOUS | Status: AC
  Filled 2017-08-26: qty 5

## 2017-08-26 MED ORDER — SODIUM CHLORIDE 0.9% FLUSH
10.0000 mL | INTRAVENOUS | Status: DC | PRN
  Administered 2017-08-26: 10 mL
  Filled 2017-08-26: qty 10

## 2017-08-26 MED ORDER — SODIUM CHLORIDE 0.9 % IV SOLN
Freq: Once | INTRAVENOUS | Status: AC
  Administered 2017-08-26: 08:00:00 via INTRAVENOUS

## 2017-08-26 MED ORDER — SODIUM CHLORIDE 0.9 % IV SOLN
75.0000 mg/m2 | Freq: Once | INTRAVENOUS | Status: AC
  Administered 2017-08-26: 150 mg via INTRAVENOUS
  Filled 2017-08-26: qty 7.5

## 2017-08-26 MED ORDER — HEPARIN SOD (PORK) LOCK FLUSH 100 UNIT/ML IV SOLN
500.0000 [IU] | Freq: Once | INTRAVENOUS | Status: AC | PRN
  Administered 2017-08-26: 500 [IU]
  Filled 2017-08-26: qty 5

## 2017-08-26 NOTE — Progress Notes (Signed)
OK to run pre and post hydration at 500 ml/hr per Dr. Marin Olp.

## 2017-08-26 NOTE — Patient Instructions (Signed)
[facility] Discharge Instructions for Patients Receiving Chemotherapy  Today you received the following chemotherapy agents:  VP-16, Ifosfamide, Mesna and Cisplatin  To help prevent nausea and vomiting after your treatment, we encourage you to take your nausea medication as ordered per MD.    If you develop nausea and vomiting that is not controlled by your nausea medication, call the clinic.   BELOW ARE SYMPTOMS THAT SHOULD BE REPORTED IMMEDIATELY:  *FEVER GREATER THAN 100.5 F  *CHILLS WITH OR WITHOUT FEVER  NAUSEA AND VOMITING THAT IS NOT CONTROLLED WITH YOUR NAUSEA MEDICATION  *UNUSUAL SHORTNESS OF BREATH  *UNUSUAL BRUISING OR BLEEDING  TENDERNESS IN MOUTH AND THROAT WITH OR WITHOUT PRESENCE OF ULCERS  *URINARY PROBLEMS  *BOWEL PROBLEMS  UNUSUAL RASH Items with * indicate a potential emergency and should be followed up as soon as possible.  Feel free to call the clinic should you have any questions or concerns. The clinic phone number is [phone].  Please show the [place] at check-in to the Emergency Department and triage nurse.

## 2017-08-27 ENCOUNTER — Inpatient Hospital Stay: Payer: Managed Care, Other (non HMO)

## 2017-08-27 ENCOUNTER — Other Ambulatory Visit: Payer: Self-pay

## 2017-08-27 VITALS — BP 120/67 | HR 85 | Temp 97.9°F | Resp 18

## 2017-08-27 DIAGNOSIS — C7802 Secondary malignant neoplasm of left lung: Secondary | ICD-10-CM

## 2017-08-27 DIAGNOSIS — Z5111 Encounter for antineoplastic chemotherapy: Secondary | ICD-10-CM | POA: Diagnosis not present

## 2017-08-27 DIAGNOSIS — C801 Malignant (primary) neoplasm, unspecified: Principal | ICD-10-CM

## 2017-08-27 MED ORDER — ETOPOSIDE CHEMO INJECTION 1 GM/50ML
75.0000 mg/m2 | Freq: Once | INTRAVENOUS | Status: AC
  Administered 2017-08-27: 150 mg via INTRAVENOUS
  Filled 2017-08-27: qty 7.5

## 2017-08-27 MED ORDER — SODIUM CHLORIDE 0.9 % IV SOLN
20.0000 mg/m2 | Freq: Once | INTRAVENOUS | Status: AC
  Administered 2017-08-27: 40 mg via INTRAVENOUS
  Filled 2017-08-27: qty 40

## 2017-08-27 MED ORDER — SODIUM CHLORIDE 0.9 % IV SOLN
Freq: Once | INTRAVENOUS | Status: AC
  Administered 2017-08-27: 08:00:00 via INTRAVENOUS

## 2017-08-27 MED ORDER — DEXAMETHASONE SODIUM PHOSPHATE 10 MG/ML IJ SOLN
INTRAMUSCULAR | Status: AC
  Filled 2017-08-27: qty 1

## 2017-08-27 MED ORDER — SODIUM CHLORIDE 0.9% FLUSH
10.0000 mL | INTRAVENOUS | Status: DC | PRN
  Administered 2017-08-27: 10 mL
  Filled 2017-08-27: qty 10

## 2017-08-27 MED ORDER — POTASSIUM CHLORIDE 2 MEQ/ML IV SOLN
Freq: Once | INTRAVENOUS | Status: AC
  Administered 2017-08-27: 08:00:00 via INTRAVENOUS
  Filled 2017-08-27: qty 10

## 2017-08-27 MED ORDER — SODIUM CHLORIDE 0.9 % IV SOLN
120.0000 mg/m2 | Freq: Once | INTRAVENOUS | Status: AC
  Administered 2017-08-27: 250 mg via INTRAVENOUS
  Filled 2017-08-27: qty 2.5

## 2017-08-27 MED ORDER — SODIUM CHLORIDE 0.9 % IV SOLN
Freq: Once | INTRAVENOUS | Status: AC
  Administered 2017-08-27: 12:00:00 via INTRAVENOUS
  Filled 2017-08-27: qty 48

## 2017-08-27 MED ORDER — HEPARIN SOD (PORK) LOCK FLUSH 100 UNIT/ML IV SOLN
500.0000 [IU] | Freq: Once | INTRAVENOUS | Status: AC | PRN
  Administered 2017-08-27: 500 [IU]
  Filled 2017-08-27: qty 5

## 2017-08-27 MED ORDER — DEXAMETHASONE SODIUM PHOSPHATE 10 MG/ML IJ SOLN
10.0000 mg | Freq: Once | INTRAMUSCULAR | Status: AC
  Administered 2017-08-27: 10 mg via INTRAVENOUS

## 2017-08-27 NOTE — Progress Notes (Signed)
Patient complains of rash on anterior and posterior trunk since Tuesday evening. Patient states the rash does not itch. Dr. Marin Olp aware on 08/26/2016.  Patient complains of nausea this morning. Patient states he didn't take compazine last night which leads to nausea in the morning. 10 mg Dexamethasone give per Chemotherapy orders. Patient is to report to nursing if nausea does not improve.   Patient states nausea has resolved.

## 2017-08-27 NOTE — Patient Instructions (Signed)
[facility] Discharge Instructions for Patients Receiving Chemotherapy  Today you received the following chemotherapy agents Cisplatin/VP 16/Mesna/ IFEX To help prevent nausea and vomiting after your treatment, we encourage you to take your nausea medication as prescribed. If you develop nausea and vomiting that is not controlled by your nausea medication, call the clinic.   BELOW ARE SYMPTOMS THAT SHOULD BE REPORTED IMMEDIATELY:  *FEVER GREATER THAN 100.5 F  *CHILLS WITH OR WITHOUT FEVER  NAUSEA AND VOMITING THAT IS NOT CONTROLLED WITH YOUR NAUSEA MEDICATION  *UNUSUAL SHORTNESS OF BREATH  *UNUSUAL BRUISING OR BLEEDING  TENDERNESS IN MOUTH AND THROAT WITH OR WITHOUT PRESENCE OF ULCERS  *URINARY PROBLEMS  *BOWEL PROBLEMS  UNUSUAL RASH Items with * indicate a potential emergency and should be followed up as soon as possible.  Feel free to call the clinic should you have any questions or concerns. The clinic phone number is [phone].  Please show the [place] at check-in to the Emergency Department and triage nurse.

## 2017-08-28 ENCOUNTER — Inpatient Hospital Stay: Payer: Managed Care, Other (non HMO)

## 2017-08-28 ENCOUNTER — Other Ambulatory Visit: Payer: Self-pay | Admitting: Family

## 2017-08-28 ENCOUNTER — Other Ambulatory Visit: Payer: Self-pay

## 2017-08-28 ENCOUNTER — Other Ambulatory Visit: Payer: Self-pay | Admitting: *Deleted

## 2017-08-28 VITALS — BP 124/71 | HR 90 | Temp 97.5°F | Resp 18

## 2017-08-28 DIAGNOSIS — Z5111 Encounter for antineoplastic chemotherapy: Secondary | ICD-10-CM | POA: Diagnosis not present

## 2017-08-28 DIAGNOSIS — C6292 Malignant neoplasm of left testis, unspecified whether descended or undescended: Secondary | ICD-10-CM

## 2017-08-28 DIAGNOSIS — C801 Malignant (primary) neoplasm, unspecified: Principal | ICD-10-CM

## 2017-08-28 DIAGNOSIS — C7802 Secondary malignant neoplasm of left lung: Secondary | ICD-10-CM

## 2017-08-28 MED ORDER — PEGFILGRASTIM 6 MG/0.6ML ~~LOC~~ PSKT
6.0000 mg | PREFILLED_SYRINGE | Freq: Once | SUBCUTANEOUS | Status: AC
  Administered 2017-08-28: 6 mg via SUBCUTANEOUS

## 2017-08-28 MED ORDER — PEGFILGRASTIM 6 MG/0.6ML ~~LOC~~ PSKT: 6.0000 mg | PREFILLED_SYRINGE | Freq: Once | SUBCUTANEOUS | Status: DC

## 2017-08-28 MED ORDER — SODIUM CHLORIDE 0.9 % IV SOLN
20.0000 mg/m2 | Freq: Once | INTRAVENOUS | Status: AC
  Administered 2017-08-28: 40 mg via INTRAVENOUS
  Filled 2017-08-28: qty 40

## 2017-08-28 MED ORDER — PALONOSETRON HCL INJECTION 0.25 MG/5ML
0.2500 mg | Freq: Once | INTRAVENOUS | Status: AC
  Administered 2017-08-28: 0.25 mg via INTRAVENOUS

## 2017-08-28 MED ORDER — SODIUM CHLORIDE 0.9 % IV SOLN
120.0000 mg/m2 | Freq: Once | INTRAVENOUS | Status: AC
  Administered 2017-08-28: 250 mg via INTRAVENOUS
  Filled 2017-08-28: qty 2.5

## 2017-08-28 MED ORDER — SODIUM CHLORIDE 0.9% FLUSH
10.0000 mL | INTRAVENOUS | Status: DC | PRN
  Administered 2017-08-28: 10 mL
  Filled 2017-08-28: qty 10

## 2017-08-28 MED ORDER — SODIUM CHLORIDE 0.9 % IV SOLN
75.0000 mg/m2 | Freq: Once | INTRAVENOUS | Status: AC
  Administered 2017-08-28: 150 mg via INTRAVENOUS
  Filled 2017-08-28: qty 7.5

## 2017-08-28 MED ORDER — PALONOSETRON HCL INJECTION 0.25 MG/5ML
INTRAVENOUS | Status: AC
  Filled 2017-08-28: qty 5

## 2017-08-28 MED ORDER — DOXYCYCLINE HYCLATE 100 MG PO TABS: 100.0000 mg | ORAL_TABLET | Freq: Two times a day (BID) | ORAL | 0 refills | Status: DC

## 2017-08-28 MED ORDER — FOSAPREPITANT DIMEGLUMINE INJECTION 150 MG
Freq: Once | INTRAVENOUS | Status: AC
  Administered 2017-08-28: 09:00:00 via INTRAVENOUS
  Filled 2017-08-28: qty 5

## 2017-08-28 MED ORDER — POTASSIUM CHLORIDE 2 MEQ/ML IV SOLN
Freq: Once | INTRAVENOUS | Status: AC
  Administered 2017-08-28: 09:00:00 via INTRAVENOUS
  Filled 2017-08-28: qty 10

## 2017-08-28 MED ORDER — SODIUM CHLORIDE 0.9 % IV SOLN
Freq: Once | INTRAVENOUS | Status: AC
  Administered 2017-08-28: 08:00:00 via INTRAVENOUS

## 2017-08-28 MED ORDER — PEGFILGRASTIM 6 MG/0.6ML ~~LOC~~ PSKT
PREFILLED_SYRINGE | SUBCUTANEOUS | Status: AC
  Filled 2017-08-28: qty 0.6

## 2017-08-28 MED ORDER — HEPARIN SOD (PORK) LOCK FLUSH 100 UNIT/ML IV SOLN
500.0000 [IU] | Freq: Once | INTRAVENOUS | Status: AC | PRN
  Administered 2017-08-28: 500 [IU]
  Filled 2017-08-28: qty 5

## 2017-08-28 MED ORDER — SODIUM CHLORIDE 0.9 % IV SOLN
Freq: Once | INTRAVENOUS | Status: AC
  Administered 2017-08-28: 12:00:00 via INTRAVENOUS
  Filled 2017-08-28: qty 48

## 2017-08-28 MED FILL — DOXYCYCLINE HYCLATE 100 MG: 100 | 5 days supply | Qty: 10 | Fill #0

## 2017-08-28 NOTE — Progress Notes (Signed)
Patient states rash on the posterior and anterior trunk is still the same as yesterday.

## 2017-08-28 NOTE — Patient Instructions (Signed)
[facility] Discharge Instructions for Patients Receiving Chemotherapy  Today you received the following chemotherapy agents VP 16/Mesna/IFEX/Cisplatin To help prevent nausea and vomiting after your treatment, we encourage you to take your nausea medication as prescribed.  If you develop nausea and vomiting that is not controlled by your nausea medication, call the clinic.   BELOW ARE SYMPTOMS THAT SHOULD BE REPORTED IMMEDIATELY:  *FEVER GREATER THAN 100.5 F  *CHILLS WITH OR WITHOUT FEVER  NAUSEA AND VOMITING THAT IS NOT CONTROLLED WITH YOUR NAUSEA MEDICATION  *UNUSUAL SHORTNESS OF BREATH  *UNUSUAL BRUISING OR BLEEDING  TENDERNESS IN MOUTH AND THROAT WITH OR WITHOUT PRESENCE OF ULCERS  *URINARY PROBLEMS  *BOWEL PROBLEMS  UNUSUAL RASH Items with * indicate a potential emergency and should be followed up as soon as possible.  Feel free to call the clinic should you have any questions or concerns. The clinic phone number is [phone].  Please show the [place] at check-in to the Emergency Department and triage nurse.

## 2017-08-31 ENCOUNTER — Other Ambulatory Visit: Payer: Self-pay | Admitting: Family

## 2017-08-31 ENCOUNTER — Ambulatory Visit (HOSPITAL_BASED_OUTPATIENT_CLINIC_OR_DEPARTMENT_OTHER)
Admission: RE | Admit: 2017-08-31 | Discharge: 2017-08-31 | Disposition: A | Discharge status: Home / Self Care | Payer: Managed Care, Other (non HMO) | Source: Ambulatory Visit | Attending: Hematology & Oncology | Admitting: Hematology & Oncology

## 2017-08-31 ENCOUNTER — Encounter (HOSPITAL_BASED_OUTPATIENT_CLINIC_OR_DEPARTMENT_OTHER): Payer: Self-pay

## 2017-08-31 DIAGNOSIS — C6292 Malignant neoplasm of left testis, unspecified whether descended or undescended: Secondary | ICD-10-CM | POA: Diagnosis not present

## 2017-08-31 DIAGNOSIS — Z85118 Personal history of other malignant neoplasm of bronchus and lung: Secondary | ICD-10-CM | POA: Diagnosis not present

## 2017-08-31 DIAGNOSIS — R918 Other nonspecific abnormal finding of lung field: Secondary | ICD-10-CM | POA: Insufficient documentation

## 2017-08-31 MED ORDER — IOPAMIDOL (ISOVUE-300) INJECTION 61%
100.0000 mL | Freq: Once | INTRAVENOUS | Status: AC | PRN
  Administered 2017-08-31: 100 mL via INTRAVENOUS

## 2017-09-14 ENCOUNTER — Ambulatory Visit: Payer: Managed Care, Other (non HMO)

## 2017-09-14 ENCOUNTER — Inpatient Hospital Stay: Payer: Managed Care, Other (non HMO)

## 2017-09-14 ENCOUNTER — Encounter: Payer: Self-pay | Admitting: Hematology & Oncology

## 2017-09-14 ENCOUNTER — Inpatient Hospital Stay: Payer: Managed Care, Other (non HMO) | Attending: Hematology & Oncology | Admitting: Hematology & Oncology

## 2017-09-14 ENCOUNTER — Other Ambulatory Visit: Payer: Self-pay

## 2017-09-14 VITALS — BP 116/58 | HR 100 | Temp 97.9°F | Resp 18 | Wt 179.8 lb

## 2017-09-14 DIAGNOSIS — C6292 Malignant neoplasm of left testis, unspecified whether descended or undescended: Secondary | ICD-10-CM | POA: Diagnosis not present

## 2017-09-14 DIAGNOSIS — M545 Low back pain: Secondary | ICD-10-CM | POA: Diagnosis not present

## 2017-09-14 DIAGNOSIS — C7802 Secondary malignant neoplasm of left lung: Secondary | ICD-10-CM

## 2017-09-14 DIAGNOSIS — C801 Malignant (primary) neoplasm, unspecified: Principal | ICD-10-CM

## 2017-09-14 DIAGNOSIS — C7800 Secondary malignant neoplasm of unspecified lung: Secondary | ICD-10-CM | POA: Diagnosis not present

## 2017-09-14 LAB — CBC WITH DIFFERENTIAL (CANCER CENTER ONLY)
BASOS ABS: 0.1 10*3/uL (ref 0.0–0.1)
Basophils Relative: 2 %
Eosinophils Absolute: 0 10*3/uL (ref 0.0–0.5)
Eosinophils Relative: 0 %
HEMATOCRIT: 36.3 % — AB (ref 38.7–49.9)
HEMOGLOBIN: 12.4 g/dL — AB (ref 13.0–17.1)
LYMPHS PCT: 18 %
Lymphs Abs: 1.4 10*3/uL (ref 0.9–3.3)
MCH: 33.2 pg (ref 28.0–33.4)
MCHC: 34.2 g/dL (ref 32.0–35.9)
MCV: 97.3 fL (ref 82.0–98.0)
Monocytes Absolute: 0.7 10*3/uL (ref 0.1–0.9)
Monocytes Relative: 8 %
NEUTROS ABS: 5.5 10*3/uL (ref 1.5–6.5)
NEUTROS PCT: 72 %
Platelet Count: 170 10*3/uL (ref 145–400)
RBC: 3.73 MIL/uL — AB (ref 4.20–5.70)
RDW: 18 % — ABNORMAL HIGH (ref 11.1–15.7)
WBC: 7.7 10*3/uL (ref 4.0–10.0)

## 2017-09-14 LAB — CMP (CANCER CENTER ONLY)
ALBUMIN: 3.7 g/dL (ref 3.5–5.0)
ALT: 19 U/L (ref 10–47)
ANION GAP: 9 (ref 5–15)
AST: 24 U/L (ref 11–38)
Alkaline Phosphatase: 96 U/L — ABNORMAL HIGH (ref 26–84)
BILIRUBIN TOTAL: 0.6 mg/dL (ref 0.2–1.6)
BUN: 14 mg/dL (ref 7–22)
CALCIUM: 9.1 mg/dL (ref 8.0–10.3)
CO2: 29 mmol/L (ref 18–33)
CREATININE: 0.9 mg/dL (ref 0.60–1.20)
Chloride: 104 mmol/L (ref 98–108)
Glucose, Bld: 114 mg/dL (ref 73–118)
Potassium: 3.8 mmol/L (ref 3.3–4.7)
Sodium: 142 mmol/L (ref 128–145)
TOTAL PROTEIN: 6.8 g/dL (ref 6.4–8.1)

## 2017-09-14 LAB — LACTATE DEHYDROGENASE: LDH: 228 U/L (ref 125–245)

## 2017-09-14 MED ORDER — SODIUM CHLORIDE 0.9% FLUSH
10.0000 mL | INTRAVENOUS | Status: DC | PRN
  Administered 2017-09-14: 10 mL via INTRAVENOUS
  Filled 2017-09-14: qty 10

## 2017-09-14 MED ORDER — HEPARIN SOD (PORK) LOCK FLUSH 100 UNIT/ML IV SOLN
500.0000 [IU] | Freq: Once | INTRAVENOUS | Status: AC
  Administered 2017-09-14: 500 [IU] via INTRAVENOUS
  Filled 2017-09-14: qty 5

## 2017-09-14 NOTE — Progress Notes (Signed)
Hematology and Oncology Follow Up Visit  Blanca Thornton [id number] 1995-11-07 22 y.o. 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 Oyster is  back for follow-up.  He looks quite good.  He feels okay.  He does have some lower back discomfort.  We did go ahead and do a CT scan on him.  Basically, the CT scan showed an excellent response.  He had marked improvement in the pulmonary lung disease.  The lung nodules that are left are all sub-centimeter.  Most are less than 5 mm.  I have to believe that they are treated tumors that will continue to shrink.  His last beta hCG was less than 1.  I think we can get his Port-A-Cath taken out.  He would like to have this taken out.  This can be done in a couple weeks.  He has had no cough or shortness of breath.  There is been no chest wall pain.  He has had no nausea or vomiting.  He has had occasional blood per rectum but this is on the toilet paper.  He may have some hemorrhoids.  Overall, his performance status is ECOG 0.  Medications:  Current Outpatient Medications:  .  acetaminophen (TYLENOL) 500 MG tablet, Take 500 mg by mouth every 6 (six) hours as needed., Disp: , Rfl:  .  albuterol (PROVENTIL HFA;VENTOLIN HFA) 108 (90 Base) MCG/ACT inhaler, Inhale 1-2 puffs into the lungs every 4 (four) hours as needed for wheezing or shortness of breath., Disp: , Rfl:  .  dexamethasone (DECADRON) 4 MG tablet, Take 2 tablets by mouth once a day on the day after cisplatin chemotherapy and then take 2 tablets 2 times a day for 2 days. Take with food., Disp: 30 tablet, Rfl: 1 .  doxycycline (VIBRA-TABS) 100 MG tablet, Take 1 tablet (100 mg total) by mouth 2 (two) times daily. For 5 days, Disp: 10 tablet, Rfl: 0 .  ibuprofen (ADVIL,MOTRIN) 400 MG tablet, Take 400 mg by mouth every 6 (six) hours as needed.,  Disp: , Rfl:  .  lidocaine-prilocaine (EMLA) cream, Apply to affected area once, Disp: 30 g, Rfl: 3 .  LORazepam (ATIVAN) 0.5 MG tablet, Take 1 tablet (0.5 mg total) by mouth every 6 (six) hours as needed (Nausea or vomiting)., Disp: 30 tablet, Rfl: 0 .  LORazepam (ATIVAN) 1 MG tablet, Take 1 tablet (1 mg total) by mouth every 8 (eight) hours as needed for anxiety., Disp: 20 tablet, Rfl: 0 .  ondansetron (ZOFRAN) 8 MG tablet, Take 1 tablet (8 mg total) by mouth 2 (two) times daily as needed. Start on the third day after cisplatin chemotherapy., Disp: 30 tablet, Rfl: 1 .  Oxycodone HCl 10 MG TABS, Take 1 tablet (10 mg total) by mouth every 4 (four) hours as needed., Disp: 24 tablet, Rfl: 0 .  oxyCODONE-acetaminophen (PERCOCET) 5-325 MG tablet, Take 1-2 tablets by mouth every 4 (four) hours as needed for severe pain., Disp: 15 tablet, Rfl: 0 .  prochlorperazine (COMPAZINE) 10 MG tablet, Take 1 tablet (10 mg total) by mouth every 6 (six) hours as needed (Nausea or vomiting)., Disp: 30 tablet, Rfl: 1 .  promethazine (PHENERGAN) 25 MG tablet, TK 1 T PO Q 4 TO 6 H, Disp: , Rfl: 0 .  sulfamethoxazole-trimethoprim (BACTRIM DS,SEPTRA DS) 800-160 MG tablet, Take 1 tablet  by mouth 2 (two) times daily. For 3 days., Disp: 6 tablet, Rfl: 0 No current facility-administered medications for this visit.   Facility-Administered Medications Ordered in Other Visits:  .  pegfilgrastim (NEULASTA ONPRO KIT) injection 6 mg, 6 mg, Subcutaneous, Once, [place], Holli Humbles, NP  Allergies: No Known Allergies  Past Medical History, Surgical history, Social history, and Family History were reviewed and updated.  Review of Systems: Review of Systems  Constitutional: Positive for fatigue.  HENT:  Negative.   Eyes: Negative.   Respiratory: Positive for shortness of breath.   Cardiovascular: Negative.   Gastrointestinal: Positive for nausea.  Endocrine: Negative.   Genitourinary: Negative.    Musculoskeletal: Negative.     Skin: Negative.   Neurological: Negative.   Hematological: Negative.   Psychiatric/Behavioral: Negative.     Physical Exam:  weight is 179 lb 12 oz (81.5 kg). His oral temperature is 97.9 F (36.6 C). His blood pressure is 116/58 (abnormal) and his pulse is 100. His respiration is 18 and oxygen saturation is 100%.   Wt Readings from Last 3 Encounters:  09/14/17 179 lb 12 oz (81.5 kg)  08/24/17 182 lb 4 oz (82.7 kg)  08/24/17 182 lb (82.6 kg)    Physical Exam  Constitutional: He is oriented to person, place, and time.  HENT:  Head: Normocephalic and atraumatic.  Mouth/Throat: Oropharynx is clear and moist.  Eyes: Pupils are equal, round, and reactive to light. EOM are normal.  Neck: Normal range of motion.  Cardiovascular: Normal rate, regular rhythm and normal heart sounds.  Pulmonary/Chest: Effort normal and breath sounds normal.  Abdominal: Soft. Bowel sounds are normal.  Musculoskeletal: Normal range of motion. He exhibits no edema, tenderness or deformity.  Lymphadenopathy:    He has no cervical adenopathy.  Neurological: He is alert and oriented to person, place, and time.  Skin: Skin is warm and dry. No rash noted. No erythema.  Psychiatric: He has a normal mood and affect. His behavior is normal. Judgment and thought content normal.  Vitals reviewed.    Lab Results  Component Value Date   WBC 7.7 09/14/2017   HGB 13.7 07/10/2017   HCT 36.3 (L) 09/14/2017   MCV 97.3 09/14/2017   PLT 170 09/14/2017     Chemistry      Component Value Date/Time   NA 137 08/24/2017 0820   K 3.6 08/24/2017 0820   CL 103 08/24/2017 0820   CO2 31 08/24/2017 0820   BUN 14 08/24/2017 0820   CREATININE 0.40 (L) 08/24/2017 0820      Component Value Date/Time   CALCIUM 8.8 08/24/2017 0820   ALKPHOS 114 (H) 08/24/2017 0820   AST 21 08/24/2017 0820   ALT 28 08/24/2017 0820   BILITOT 0.6 08/24/2017 0820      Impression and Plan: Mr. Nickson is a 22 year old white male with  metastatic embryonal cell carcinoma.  He had a left orchiectomy.  This was performed back on January 18.  A week later, he showed up in the emergency room with chest discomfort and some shortness of breath.  He had innumerable pulmonary nodules.  For right now, we will plan for another CT scan in about 6 weeks.  I think this would be reasonable.  I think that the tumor marker will be the real key as to how things go.  He has good prognosis testicular cancer.  As such, he should do quite well.  He basically had his beta-hCG normalize after 2 cycles of treatment.  His LDH normalized after 1 cycle.  I will plan to get him back in 6 weeks.  We will do his CAT scan same day that I see him.    [name], MD 4/1/20199:00 AM

## 2017-09-14 NOTE — Patient Instructions (Signed)
Implanted Port Home Guide An implanted port is a type of central line that is placed under the skin. Central lines are used to provide IV access when treatment or nutrition needs to be given through a person's veins. Implanted ports are used for long-term IV access. An implanted port may be placed because:  You need IV medicine that would be irritating to the small veins in your hands or arms.  You need long-term IV medicines, such as antibiotics.  You need IV nutrition for a long period.  You need frequent blood draws for lab tests.  You need dialysis.  Implanted ports are usually placed in the chest area, but they can also be placed in the upper arm, the abdomen, or the leg. An implanted port has two main parts:  Reservoir. The reservoir is round and will appear as a small, raised area under your skin. The reservoir is the part where a needle is inserted to give medicines or draw blood.  Catheter. The catheter is a thin, flexible tube that extends from the reservoir. The catheter is placed into a large vein. Medicine that is inserted into the reservoir goes into the catheter and then into the vein.  How will I care for my incision site? Do not get the incision site wet. Bathe or shower as directed by your health care provider. How is my port accessed? Special steps must be taken to access the port:  Before the port is accessed, a numbing cream can be placed on the skin. This helps numb the skin over the port site.  Your health care provider uses a sterile technique to access the port. ? Your health care provider must put on a mask and sterile gloves. ? The skin over your port is cleaned carefully with an antiseptic and allowed to dry. ? The port is gently pinched between sterile gloves, and a needle is inserted into the port.  Only "non-coring" port needles should be used to access the port. Once the port is accessed, a blood return should be checked. This helps ensure that the port  is in the vein and is not clogged.  If your port needs to remain accessed for a constant infusion, a clear (transparent) bandage will be placed over the needle site. The bandage and needle will need to be changed every week, or as directed by your health care provider.  Keep the bandage covering the needle clean and dry. Do not get it wet. Follow your health care provider's instructions on how to take a shower or bath while the port is accessed.  If your port does not need to stay accessed, no bandage is needed over the port.  What is flushing? Flushing helps keep the port from getting clogged. Follow your health care provider's instructions on how and when to flush the port. Ports are usually flushed with saline solution or a medicine called heparin. The need for flushing will depend on how the port is used.  If the port is used for intermittent medicines or blood draws, the port will need to be flushed: ? After medicines have been given. ? After blood has been drawn. ? As part of routine maintenance.  If a constant infusion is running, the port may not need to be flushed.  How long will my port stay implanted? The port can stay in for as long as your health care provider thinks it is needed. When it is time for the port to come out, surgery will be   done to remove it. The procedure is similar to the one performed when the port was put in. When should I seek immediate medical care? When you have an implanted port, you should seek immediate medical care if:  You notice a bad smell coming from the incision site.  You have swelling, redness, or drainage at the incision site.  You have more swelling or pain at the port site or the surrounding area.  You have a fever that is not controlled with medicine.  This information is not intended to replace advice given to you by your health care provider. Make sure you discuss any questions you have with your health care provider. Document  Released: 06/02/2005 Document Revised: 11/08/2015 Document Reviewed: 02/07/2013 [employer] Interactive Patient Education  2017 [employer]  

## 2017-09-15 ENCOUNTER — Telehealth: Payer: Self-pay | Admitting: *Deleted

## 2017-09-15 ENCOUNTER — Ambulatory Visit: Payer: Managed Care, Other (non HMO)

## 2017-09-15 LAB — AFP TUMOR MARKER: AFP, Serum, Tumor Marker: 4.2 ng/mL (ref 0.0–8.3)

## 2017-09-15 LAB — BETA HCG QUANT (REF LAB): hCG Quant: <1 m[IU]/mL (ref 0–3)

## 2017-09-15 NOTE — Telephone Encounter (Addendum)
Patient is aware of results.   ----- Message from [name], MD sent at 09/15/2017  6:54 AM EDT ----- Call - tumor marker still less than 1!!!  No surprise!!!  pete

## 2017-09-16 ENCOUNTER — Ambulatory Visit: Payer: Managed Care, Other (non HMO)

## 2017-09-17 ENCOUNTER — Ambulatory Visit: Payer: Managed Care, Other (non HMO)

## 2017-09-18 ENCOUNTER — Ambulatory Visit: Payer: Managed Care, Other (non HMO)

## 2017-09-28 ENCOUNTER — Other Ambulatory Visit (HOSPITAL_COMMUNITY): Payer: Managed Care, Other (non HMO)

## 2017-09-28 ENCOUNTER — Ambulatory Visit (HOSPITAL_COMMUNITY): Payer: Managed Care, Other (non HMO)

## 2017-10-09 ENCOUNTER — Other Ambulatory Visit: Payer: Self-pay | Admitting: Radiology

## 2017-10-12 ENCOUNTER — Ambulatory Visit (HOSPITAL_COMMUNITY)
Admission: RE | Admit: 2017-10-12 | Discharge: 2017-10-12 | Disposition: A | Discharge status: Home / Self Care | Payer: Managed Care, Other (non HMO) | Source: Ambulatory Visit | Attending: Hematology & Oncology | Admitting: Hematology & Oncology

## 2017-10-12 ENCOUNTER — Encounter (HOSPITAL_COMMUNITY): Payer: Self-pay

## 2017-10-12 DIAGNOSIS — Z8547 Personal history of malignant neoplasm of testis: Secondary | ICD-10-CM | POA: Insufficient documentation

## 2017-10-12 DIAGNOSIS — Z9079 Acquired absence of other genital organ(s): Secondary | ICD-10-CM | POA: Insufficient documentation

## 2017-10-12 DIAGNOSIS — I10 Essential (primary) hypertension: Secondary | ICD-10-CM | POA: Insufficient documentation

## 2017-10-12 DIAGNOSIS — C6292 Malignant neoplasm of left testis, unspecified whether descended or undescended: Secondary | ICD-10-CM

## 2017-10-12 DIAGNOSIS — Z452 Encounter for adjustment and management of vascular access device: Secondary | ICD-10-CM | POA: Diagnosis present

## 2017-10-12 HISTORY — PX: IR REMOVAL TUN ACCESS W/ PORT W/O FL MOD SED: IMG2290

## 2017-10-12 LAB — CBC WITH DIFFERENTIAL/PLATELET
Basophils Absolute: 0 10*3/uL (ref 0.0–0.1)
Basophils Relative: 1 %
EOS ABS: 0.3 10*3/uL (ref 0.0–0.7)
Eosinophils Relative: 4 %
HCT: 38.8 % — ABNORMAL LOW (ref 39.0–52.0)
HEMOGLOBIN: 13.1 g/dL (ref 13.0–17.0)
LYMPHS ABS: 1.6 10*3/uL (ref 0.7–4.0)
Lymphocytes Relative: 25 %
MCH: 32.9 pg (ref 26.0–34.0)
MCHC: 33.8 g/dL (ref 30.0–36.0)
MCV: 97.5 fL (ref 78.0–100.0)
MONOS PCT: 9 %
Monocytes Absolute: 0.6 10*3/uL (ref 0.1–1.0)
NEUTROS PCT: 61 %
Neutro Abs: 3.9 10*3/uL (ref 1.7–7.7)
Platelets: 241 10*3/uL (ref 150–400)
RBC: 3.98 MIL/uL — ABNORMAL LOW (ref 4.22–5.81)
RDW: 13.3 % (ref 11.5–15.5)
WBC: 6.3 10*3/uL (ref 4.0–10.5)

## 2017-10-12 LAB — PROTIME-INR
INR: 1.05
PROTHROMBIN TIME: 13.7 s (ref 11.4–15.2)

## 2017-10-12 MED ORDER — FENTANYL CITRATE (PF) 100 MCG/2ML IJ SOLN
INTRAMUSCULAR | Status: AC
  Filled 2017-10-12: qty 4

## 2017-10-12 MED ORDER — CEFAZOLIN SODIUM-DEXTROSE 2-4 GM/100ML-% IV SOLN
INTRAVENOUS | Status: AC
  Administered 2017-10-12: 2 g via INTRAVENOUS
  Filled 2017-10-12: qty 100

## 2017-10-12 MED ORDER — MIDAZOLAM HCL 2 MG/2ML IJ SOLN
INTRAMUSCULAR | Status: AC | PRN
  Administered 2017-10-12 (×3): 1 mg via INTRAVENOUS

## 2017-10-12 MED ORDER — LIDOCAINE-EPINEPHRINE (PF) 2 %-1:200000 IJ SOLN
INTRAMUSCULAR | Status: AC | PRN
  Administered 2017-10-12: 10 mL

## 2017-10-12 MED ORDER — MIDAZOLAM HCL 2 MG/2ML IJ SOLN
INTRAMUSCULAR | Status: AC
  Filled 2017-10-12: qty 4

## 2017-10-12 MED ORDER — SODIUM CHLORIDE 0.9 % IV SOLN
INTRAVENOUS | Status: DC
  Administered 2017-10-12: 08:00:00 via INTRAVENOUS

## 2017-10-12 MED ORDER — CEFAZOLIN SODIUM-DEXTROSE 2-4 GM/100ML-% IV SOLN
2.0000 g | INTRAVENOUS | Status: AC
  Administered 2017-10-12: 2 g via INTRAVENOUS

## 2017-10-12 MED ORDER — LIDOCAINE-EPINEPHRINE (PF) 2 %-1:200000 IJ SOLN
INTRAMUSCULAR | Status: AC
  Filled 2017-10-12: qty 20

## 2017-10-12 MED ORDER — FENTANYL CITRATE (PF) 100 MCG/2ML IJ SOLN
INTRAMUSCULAR | Status: AC | PRN
  Administered 2017-10-12 (×3): 50 ug via INTRAVENOUS

## 2017-10-12 NOTE — Discharge Instructions (Signed)
Moderate Conscious Sedation, Adult, Care After °These instructions provide you with information about caring for yourself after your procedure. Your health care provider may also give you more specific instructions. Your treatment has been planned according to current medical practices, but problems sometimes occur. Call your health care provider if you have any problems or questions after your procedure. °What can I expect after the procedure? °After your procedure, it is common: °· To feel sleepy for several hours. °· To feel clumsy and have poor balance for several hours. °· To have poor judgment for several hours. °· To vomit if you eat too soon. ° °Follow these instructions at home: °For at least 24 hours after the procedure: ° °· Do not: °? Participate in activities where you could fall or become injured. °? Drive. °? Use heavy machinery. °? Drink alcohol. °? Take sleeping pills or medicines that cause drowsiness. °? Make important decisions or sign legal documents. °? Take care of children on your own. °· Rest. °Eating and drinking °· Follow the diet recommended by your health care provider. °· If you vomit: °? Drink water, juice, or soup when you can drink without vomiting. °? Make sure you have little or no nausea before eating solid foods. °General instructions °· Have a responsible adult stay with you until you are awake and alert. °· Take over-the-counter and prescription medicines only as told by your health care provider. °· If you smoke, do not smoke without supervision. °· Keep all follow-up visits as told by your health care provider. This is important. °Contact a health care provider if: °· You keep feeling nauseous or you keep vomiting. °· You feel light-headed. °· You develop a rash. °· You have a fever. °Get help right away if: °· You have trouble breathing. °This information is not intended to replace advice given to you by your health care provider. Make sure you discuss any questions you have  with your health care provider. °Document Released: 03/23/2013 Document Revised: 11/05/2015 Document Reviewed: 09/22/2015 °Elsevier Interactive Patient Education © 2018 [employer] ° ° °Implanted Port Removal, Care After °Refer to this sheet in the next few weeks. These instructions provide you with information about caring for yourself after your procedure. Your health care provider may also give you more specific instructions. Your treatment has been planned according to current medical practices, but problems sometimes occur. Call your health care provider if you have any problems or questions after your procedure. °What can I expect after the procedure? °After the procedure, it is common to have: °· Soreness or pain near your incision. °· Some swelling or bruising near your incision. ° °Follow these instructions at home: °Medicines °· Take over-the-counter and prescription medicines only as told by your health care provider. °· If you were prescribed an antibiotic medicine, take it as told by your health care provider. Do not stop taking the antibiotic even if you start to feel better. °Bathing °· Do not take baths, swim, or use a hot tub until your health care provider approves. Ask your health care provider if you can take showers. You may only be allowed to take sponge baths for bathing.  You may shower tomorrow. °Incision care °· Follow instructions from your health care provider about how to take care of your incision. Make sure you: °? Wash your hands with soap and water before you change your bandage (dressing). If soap and water are not available, use hand sanitizer. °? Change your dressing as told by your   health care provider.  You may remove your dressing tomorrow. °? Keep your dressing dry. °? Leave stitches (sutures), skin glue, or adhesive strips in place. These skin closures may need to stay in place for 2 weeks or longer. If adhesive strip edges start to loosen and curl up, you may trim the  loose edges. Do not remove adhesive strips completely unless your health care provider tells you to do that. °· Check your incision area every day for signs of infection. Check for: °? More redness, swelling, or pain. °? More fluid or blood. °? Warmth. °? Pus or a bad smell. °Driving °· If you received a sedative, do not drive for 24 hours after the procedure. °· If you did not receive a sedative, ask your health care provider when it is safe to drive. °Activity °· Return to your normal activities as told by your health care provider. Ask your health care provider what activities are safe for you. °· Until your health care provider says it is safe: °? Do not lift anything that is heavier than 10 lb (4.5 kg). °? Do not do activities that involve lifting your arms over your head. °General instructions °· Do not use any tobacco products, such as cigarettes, chewing tobacco, and e-cigarettes. Tobacco can delay healing. If you need help quitting, ask your health care provider. °· Keep all follow-up visits as told by your health care provider. This is important. °Contact a health care provider if: °· You have more redness, swelling, or pain around your incision. °· You have more fluid or blood coming from your incision. °· Your incision feels warm to the touch. °· You have pus or a bad smell coming from your incision. °· You have a fever. °· You have pain that is not relieved by your pain medicine. °Get help right away if: °· You have chest pain. °· You have difficulty breathing. °This information is not intended to replace advice given to you by your health care provider. Make sure you discuss any questions you have with your health care provider. °Document Released: 05/14/2015 Document Revised: 11/08/2015 Document Reviewed: 03/07/2015 °Elsevier Interactive Patient Education © 2018 [employer] ° °

## 2017-10-12 NOTE — Procedures (Signed)
Interventional Radiology Procedure Note  Procedure: Explantation of right chest portacatheter  Complications: None  Estimated Blood Loss: None  Recommendations: - DC home   Signed,  Kalijah Zeiss K. Jesilyn Easom, MD    

## 2017-10-12 NOTE — H&P (Signed)
Referring Physician(s): Ennever,Peter R  Supervising Physician: Jacqulynn Cadet  Patient Status:  [facility]  Chief Complaint:  "I'm getting my port out"  Subjective: Patient familiar to IR service from prior Port-A-Cath placement on 07/09/2017.  He has a history of metastatic embryonal cell carcinoma, status post left orchiectomy and completion of treatment.  He presents today for Port-A-Cath removal.  He currently denies fever, headache, chest pain, dyspnea, cough, abdominal/back pain, nausea, vomiting or bleeding.  Past Medical History:  Diagnosis Date  . Hypertension    states recent high blood pressure during the last few weeks  . 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 Non-seminomatous testicular cancer, left ([place]) 07/07/2017  . Scrotum pain   . Testicular [facility])    Past Surgical History:  Procedure Laterality Date  . HYDROCELE EXCISION Left 07/03/2017   Procedure: HYDROCELECTOMY ADULT/ INGUINAL APPROACH/ TESTICULAR BIOPSY/ ORCHIECTOMY;  Surgeon: Kathie Rhodes, MD;  Location: [facility];  Service: Urology;  Laterality: Left;  . IR FLUORO GUIDE PORT INSERTION RIGHT  07/09/2017  . IR US GUIDE VASC ACCESS RIGHT  07/09/2017  . NO PAST SURGERIES    . ORCHIECTOMY      Allergies: Patient has no known allergies.  Medications: Prior to Admission medications   Medication Sig Start Date End Date Taking? Authorizing Provider  acetaminophen (TYLENOL) 500 MG tablet Take 500 mg by mouth every 6 (six) hours as needed.   Yes [provider]  ibuprofen (ADVIL,MOTRIN) 400 MG tablet Take 400 mg by mouth every 6 (six) hours as needed.   Yes [provider]  loratadine (CLARITIN) 10 MG tablet Take 10 mg by mouth daily.   Yes [provider]  LORazepam (ATIVAN) 0.5 MG tablet Take 1 tablet (0.5 mg total) by mouth every 6 (six) hours as needed (Nausea or vomiting). 07/10/17  Yes Ennever, Rudell Cobb, MD   LORazepam (ATIVAN) 1 MG tablet Take 1 tablet (1 mg total) by mouth every 8 (eight) hours as needed for anxiety. 07/07/17  Yes Molpus, John, MD  ondansetron (ZOFRAN) 8 MG tablet Take 1 tablet (8 mg total) by mouth 2 (two) times daily as needed. Start on the third day after cisplatin chemotherapy. 07/10/17  Yes Ennever, Rudell Cobb, MD  albuterol (PROVENTIL HFA;VENTOLIN HFA) 108 (90 Base) MCG/ACT inhaler Inhale 1-2 puffs into the lungs every 4 (four) hours as needed for wheezing or shortness of breath.    [provider]  dexamethasone (DECADRON) 4 MG tablet Take 2 tablets by mouth once a day on the day after cisplatin chemotherapy and then take 2 tablets 2 times a day for 2 days. Take with food. 08/24/17   [name], MD  doxycycline (VIBRA-TABS) 100 MG tablet Take 1 tablet (100 mg total) by mouth 2 (two) times daily. For 5 days 08/28/17   [name], MD  lidocaine-prilocaine (EMLA) cream Apply to affected area once 07/10/17   [name], MD  Oxycodone HCl 10 MG TABS Take 1 tablet (10 mg total) by mouth every 4 (four) hours as needed. 07/03/17   Kathie Rhodes, MD  oxyCODONE-acetaminophen (PERCOCET) 5-325 MG tablet Take 1-2 tablets by mouth every 4 (four) hours as needed for severe pain. 07/11/17   Sherwood Gambler, MD  prochlorperazine (COMPAZINE) 10 MG tablet Take 1 tablet (10 mg total) by mouth every 6 (six) hours as needed (Nausea or vomiting). 08/24/17   [name], MD  promethazine (PHENERGAN) 25  MG tablet TK 1 T PO Q 4 TO 6 H 08/09/17   [provider]  sulfamethoxazole-trimethoprim (BACTRIM DS,SEPTRA DS) 800-160 MG tablet Take 1 tablet by mouth 2 (two) times daily. For 3 days. 07/09/17   [name], MD     Vital Signs: BP 126/76   Pulse 60   Temp 97.7 F (36.5 C) (Oral)   Resp 16   SpO2 100%   Physical Exam awake, alert.  Chest clear to auscultation bilaterally.  Clean, intact right chest wall Port-A-Cath.  Heart with slightly bradycardic but regular  rhythm.  Abdomen soft, positive bowel sounds, nontender.  No lower extremity edema.  Imaging: No results found.  Labs:  CBC: Recent Labs    07/13/17 0830 08/03/17 0810 08/24/17 0820 09/14/17 0815  WBC 7.4 5.1 7.8 7.7  HGB 12.6* 12.0* 12.3* 12.4*  HCT 36.7* 35.7* 36.4* 36.3*  PLT 290 122* 114* 170    COAGS: Recent Labs    07/09/17 0709 10/12/17 0758  INR 1.20 1.05  APTT 41*  --     BMP: Recent Labs    07/07/17 0010  07/10/17 2255 07/13/17 0830 08/03/17 0810 08/24/17 0820 09/14/17 0815  NA 132*   < > 132* 138 139 137 142  K 3.9   < > 3.9 3.6 4.3 3.6 3.8  CL 97*   < > 99* 103 107 103 104  CO2 26   < > 22 26 27 31 29   GLUCOSE 115*   < > 111* 142* 104 94 114  BUN 12   < > 13 17 13 14 14   CALCIUM 8.7*   < > 9.3 9.0 8.6 8.8 9.1  CREATININE 0.75   < > 0.93 0.80 0.70 0.40* 0.90  GFRNONAA >60  --  >60  --   --   --   --   GFRAA >60  --  >60  --   --   --   --    < > = values in this interval not displayed.    LIVER FUNCTION TESTS: Recent Labs    07/13/17 0830 08/03/17 0810 08/24/17 0820 09/14/17 0815  BILITOT 0.5 0.5 0.6 0.6  AST 26 21 21 24   ALT 13 27 28 19   ALKPHOS 86* 98* 114* 96*  PROT 7.3 6.4 6.1* 6.8  ALBUMIN 3.3* 3.2* 3.3* 3.7    Assessment and Plan: Pt with history of metastatic embryonal cell carcinoma, status post left orchiectomy and completion of treatment.  He presents today for Port-A-Cath removal.  Details/risks of procedure, including but not limited to, internal bleeding, infection, injury to adjacent structures, discussed with patient with his understanding and consent.   Electronically Signed: D. Rowe Robert, PA-C 10/12/2017, 8:48 AM   I spent a total of 20 minutes at the the patient's bedside AND on the patient's hospital floor or unit, greater than 50% of which was counseling/coordinating care for Port-A-Cath removal

## 2017-10-27 ENCOUNTER — Inpatient Hospital Stay: Payer: Managed Care, Other (non HMO) | Attending: Hematology & Oncology | Admitting: Hematology & Oncology

## 2017-10-27 ENCOUNTER — Inpatient Hospital Stay: Payer: Managed Care, Other (non HMO)

## 2017-10-27 ENCOUNTER — Encounter (HOSPITAL_BASED_OUTPATIENT_CLINIC_OR_DEPARTMENT_OTHER): Payer: Self-pay

## 2017-10-27 ENCOUNTER — Ambulatory Visit (HOSPITAL_BASED_OUTPATIENT_CLINIC_OR_DEPARTMENT_OTHER)
Admission: RE | Admit: 2017-10-27 | Discharge: 2017-10-27 | Disposition: A | Discharge status: Home / Self Care | Payer: Managed Care, Other (non HMO) | Source: Ambulatory Visit | Attending: Hematology & Oncology | Admitting: Hematology & Oncology

## 2017-10-27 ENCOUNTER — Other Ambulatory Visit: Payer: Managed Care, Other (non HMO)

## 2017-10-27 VITALS — BP 150/77 | HR 86 | Temp 97.5°F | Resp 16 | Wt 192.0 lb

## 2017-10-27 DIAGNOSIS — C7800 Secondary malignant neoplasm of unspecified lung: Secondary | ICD-10-CM | POA: Insufficient documentation

## 2017-10-27 DIAGNOSIS — C6292 Malignant neoplasm of left testis, unspecified whether descended or undescended: Secondary | ICD-10-CM | POA: Insufficient documentation

## 2017-10-27 DIAGNOSIS — Z9221 Personal history of antineoplastic chemotherapy: Secondary | ICD-10-CM | POA: Insufficient documentation

## 2017-10-27 LAB — CBC WITH DIFFERENTIAL (CANCER CENTER ONLY)
BASOS PCT: 1 %
Basophils Absolute: 0 10*3/uL (ref 0.0–0.1)
EOS ABS: 0.4 10*3/uL (ref 0.0–0.5)
Eosinophils Relative: 6 %
HCT: 41.1 % (ref 38.7–49.9)
HEMOGLOBIN: 13.9 g/dL (ref 13.0–17.1)
Lymphocytes Relative: 17 %
Lymphs Abs: 1.1 10*3/uL (ref 0.9–3.3)
MCH: 33.3 pg (ref 28.0–33.4)
MCHC: 33.8 g/dL (ref 32.0–35.9)
MCV: 98.6 fL — ABNORMAL HIGH (ref 82.0–98.0)
Monocytes Absolute: 0.6 10*3/uL (ref 0.1–0.9)
Monocytes Relative: 9 %
NEUTROS PCT: 67 %
Neutro Abs: 4.5 10*3/uL (ref 1.5–6.5)
PLATELETS: 186 10*3/uL (ref 145–400)
RBC: 4.17 MIL/uL — AB (ref 4.20–5.70)
RDW: 11.2 % (ref 11.1–15.7)
WBC: 6.7 10*3/uL (ref 4.0–10.0)

## 2017-10-27 LAB — CMP (CANCER CENTER ONLY)
ALBUMIN: 4.2 g/dL (ref 3.5–5.0)
ALK PHOS: 86 U/L — AB (ref 26–84)
ALT: 47 U/L (ref 10–47)
AST: 35 U/L (ref 11–38)
Anion gap: 10 (ref 5–15)
BUN: 8 mg/dL (ref 7–22)
CALCIUM: 9.3 mg/dL (ref 8.0–10.3)
CHLORIDE: 102 mmol/L (ref 98–108)
CO2: 28 mmol/L (ref 18–33)
Creatinine: 0.8 mg/dL (ref 0.60–1.20)
GLUCOSE: 105 mg/dL (ref 73–118)
Potassium: 3.1 mmol/L — ABNORMAL LOW (ref 3.3–4.7)
Sodium: 140 mmol/L (ref 128–145)
Total Bilirubin: 1.3 mg/dL (ref 0.2–1.6)
Total Protein: 7.5 g/dL (ref 6.4–8.1)

## 2017-10-27 LAB — LACTATE DEHYDROGENASE: LDH: 196 U/L (ref 125–245)

## 2017-10-27 MED ORDER — BARIUM SULFATE 2 % PO SUSP: 450.0000 mL | Freq: Once | ORAL | Status: DC

## 2017-10-27 MED ORDER — IOPAMIDOL (ISOVUE-300) INJECTION 61%
100.0000 mL | Freq: Once | INTRAVENOUS | Status: AC | PRN
  Administered 2017-10-27: 100 mL via INTRAVENOUS

## 2017-10-27 NOTE — Progress Notes (Signed)
Hematology and Oncology Follow Up Visit  Daniel Shepherd [id number] 1995-11-14 22 y.o. 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  He looks quite good.  He went to a heavy metal concert down in [place], [place] over the weekend.  The weather was not that great.  However, he still had a good time.  His testicular cancer is not to be a problem from my perspective.  We did do a CT scan of his body today.  The residual pulmonary nodules that he had are still regressing in size.  He does have slight low potassium.  His potassium is 3.1.  He did have an episode of syncope.  I am not sure as to why he had this.  He said that he had an abnormal heart rhythm when he had his Port-A-Cath removed.  I am not sure exactly what this means.  When I listen to him with my stethoscope, I do not detect anything that is abnormal.  His tumor markers have all been normal.    His last beta-hCG was less than 1.   He has had no cough.  He has had no problems with bowels or bladder.  His appetite is doing better.  His taste for food is improving.  Overall, his performance status is ECOG 0.  Medications:  Current Outpatient Medications:  .  acetaminophen (TYLENOL) 500 MG tablet, Take 500 mg by mouth every 6 (six) hours as needed., Disp: , Rfl:  .  albuterol (PROVENTIL HFA;VENTOLIN HFA) 108 (90 Base) MCG/ACT inhaler, Inhale 1-2 puffs into the lungs every 4 (four) hours as needed for wheezing or shortness of breath., Disp: , Rfl:  .  ibuprofen (ADVIL,MOTRIN) 400 MG tablet, Take 400 mg by mouth every 6 (six) hours as needed., Disp: , Rfl:  .  loratadine (CLARITIN) 10 MG tablet, Take 10 mg by mouth daily., Disp: , Rfl:  .  LORazepam (ATIVAN) 0.5 MG tablet, Take 1 tablet (0.5 mg total) by mouth every 6 (six)  hours as needed (Nausea or vomiting). (Patient not taking: Reported on 10/27/2017), Disp: 30 tablet, Rfl: 0 .  LORazepam (ATIVAN) 1 MG tablet, Take 1 tablet (1 mg total) by mouth every 8 (eight) hours as needed for anxiety., Disp: 20 tablet, Rfl: 0 .  ondansetron (ZOFRAN) 8 MG tablet, Take 1 tablet (8 mg total) by mouth 2 (two) times daily as needed. Start on the third day after cisplatin chemotherapy. (Patient not taking: Reported on 10/27/2017), Disp: 30 tablet, Rfl: 1 .  prochlorperazine (COMPAZINE) 10 MG tablet, Take 1 tablet (10 mg total) by mouth every 6 (six) hours as needed (Nausea or vomiting). (Patient not taking: Reported on 10/27/2017), Disp: 30 tablet, Rfl: 1 .  promethazine (PHENERGAN) 25 MG tablet, TK 1 T PO Q 4 TO 6 H, Disp: , Rfl: 0 No current facility-administered medications for this visit.   Facility-Administered Medications Ordered in Other Visits:  .  barium (READI-CAT 2) 2 % suspension 450 mL, 450 mL, Oral, Once, Abisai Coble R, MD .  pegfilgrastim (NEULASTA ONPRO KIT) injection 6 mg, 6 mg, Subcutaneous, Once, [place], Holli Humbles, NP  Allergies: No Known Allergies  Past Medical History, Surgical history, Social history, and Family History were reviewed and updated.  Review of Systems: Review of Systems  Constitutional: Positive for fatigue.  HENT:  Negative.   Eyes: Negative.   Respiratory: Positive for shortness of breath.   Cardiovascular: Negative.   Gastrointestinal: Positive for nausea.  Endocrine: Negative.   Genitourinary: Negative.    Musculoskeletal: Negative.   Skin: Negative.   Neurological: Negative.   Hematological: Negative.   Psychiatric/Behavioral: Negative.     Physical Exam:  weight is 192 lb (87.1 kg). His oral temperature is 97.5 F (36.4 C) (abnormal). His blood pressure is 150/77 (abnormal) and his pulse is 86. His respiration is 16 and oxygen saturation is 100%.   Wt Readings from Last 3 Encounters:  10/27/17 192 lb (87.1 kg)    09/14/17 179 lb 12 oz (81.5 kg)  08/24/17 182 lb 4 oz (82.7 kg)    Physical Exam  Constitutional: He is oriented to person, place, and time.  HENT:  Head: Normocephalic and atraumatic.  Mouth/Throat: Oropharynx is clear and moist.  Eyes: Pupils are equal, round, and reactive to light. EOM are normal.  Neck: Normal range of motion.  Cardiovascular: Normal rate, regular rhythm and normal heart sounds.  Pulmonary/Chest: Effort normal and breath sounds normal.  Abdominal: Soft. Bowel sounds are normal.  Musculoskeletal: Normal range of motion. He exhibits no edema, tenderness or deformity.  Lymphadenopathy:    He has no cervical adenopathy.  Neurological: He is alert and oriented to person, place, and time.  Skin: Skin is warm and dry. No rash noted. No erythema.  Psychiatric: He has a normal mood and affect. His behavior is normal. Judgment and thought content normal.  Vitals reviewed.    Lab Results  Component Value Date   WBC 6.7 10/27/2017   HGB 13.9 10/27/2017   HCT 41.1 10/27/2017   MCV 98.6 (H) 10/27/2017   PLT 186 10/27/2017     Chemistry      Component Value Date/Time   NA 140 10/27/2017 0910   K 3.1 (L) 10/27/2017 0910   CL 102 10/27/2017 0910   CO2 28 10/27/2017 0910   BUN 8 10/27/2017 0910   CREATININE 0.80 10/27/2017 0910      Component Value Date/Time   CALCIUM 9.3 10/27/2017 0910   ALKPHOS 86 (H) 10/27/2017 0910   AST 35 10/27/2017 0910   ALT 47 10/27/2017 0910   BILITOT 1.3 10/27/2017 0910      Impression and Plan: Daniel Shepherd is a 21 year old white male with metastatic embryonal cell carcinoma.  He had a left orchiectomy.  This was performed back on January 18.  A week later, he showed up in the emergency room with chest discomfort and some shortness of breath.  He had innumerable pulmonary nodules.  For right now, we will plan for another CT scan in about 3 months.  I think this would be reasonable.  I think that the tumor marker will be the real  key as to how things go.  He has good prognosis testicular cancer.  As such, he should do quite well.  He basically had his beta-hCG normalize after 2 cycles of treatment.  His LDH normalized after 1 cycle.    [name], MD 5/14/201912:43 PM

## 2017-10-28 ENCOUNTER — Encounter: Payer: Self-pay | Admitting: *Deleted

## 2017-10-28 LAB — AFP TUMOR MARKER: AFP, Serum, Tumor Marker: 3.7 ng/mL (ref 0.0–8.3)

## 2017-10-28 LAB — BETA HCG QUANT (REF LAB): hCG Quant: <1 m[IU]/mL (ref 0–3)

## 2017-10-30 ENCOUNTER — Emergency Department (HOSPITAL_BASED_OUTPATIENT_CLINIC_OR_DEPARTMENT_OTHER): Payer: Managed Care, Other (non HMO)

## 2017-10-30 ENCOUNTER — Other Ambulatory Visit: Payer: Self-pay

## 2017-10-30 ENCOUNTER — Emergency Department (HOSPITAL_BASED_OUTPATIENT_CLINIC_OR_DEPARTMENT_OTHER)
Admission: EM | Admit: 2017-10-30 | Discharge: 2017-10-30 | Disposition: A | Discharge status: Home / Self Care | Payer: Managed Care, Other (non HMO) | Attending: Emergency Medicine | Admitting: Emergency Medicine

## 2017-10-30 ENCOUNTER — Encounter (HOSPITAL_BASED_OUTPATIENT_CLINIC_OR_DEPARTMENT_OTHER): Payer: Self-pay | Admitting: Emergency Medicine

## 2017-10-30 DIAGNOSIS — Y9351 Activity, roller skating (inline) and skateboarding: Secondary | ICD-10-CM | POA: Diagnosis not present

## 2017-10-30 DIAGNOSIS — Z85118 Personal history of other malignant neoplasm of bronchus and lung: Secondary | ICD-10-CM | POA: Diagnosis not present

## 2017-10-30 DIAGNOSIS — Z8547 Personal history of malignant neoplasm of testis: Secondary | ICD-10-CM | POA: Diagnosis not present

## 2017-10-30 DIAGNOSIS — Y929 Unspecified place or not applicable: Secondary | ICD-10-CM | POA: Diagnosis not present

## 2017-10-30 DIAGNOSIS — Y999 Unspecified external cause status: Secondary | ICD-10-CM | POA: Insufficient documentation

## 2017-10-30 DIAGNOSIS — S52124A Nondisplaced fracture of head of right radius, initial encounter for closed fracture: Secondary | ICD-10-CM | POA: Insufficient documentation

## 2017-10-30 DIAGNOSIS — Z79899 Other long term (current) drug therapy: Secondary | ICD-10-CM | POA: Insufficient documentation

## 2017-10-30 DIAGNOSIS — S59801A Other specified injuries of right elbow, initial encounter: Secondary | ICD-10-CM | POA: Diagnosis present

## 2017-10-30 DIAGNOSIS — I10 Essential (primary) hypertension: Secondary | ICD-10-CM | POA: Insufficient documentation

## 2017-10-30 MED ORDER — HYDROCODONE-ACETAMINOPHEN 5-325 MG PO TABS: 1.0000 | ORAL_TABLET | Freq: Four times a day (QID) | ORAL | 0 refills | Status: DC | PRN

## 2017-10-30 MED ORDER — HYDROCODONE-ACETAMINOPHEN 5-325 MG PO TABS
1.0000 | ORAL_TABLET | Freq: Once | ORAL | Status: AC
  Administered 2017-10-30: 1 via ORAL
  Filled 2017-10-30: qty 1

## 2017-10-30 NOTE — Discharge Instructions (Signed)
Please read instructions below. Keep your splint clean and dry, and on at all times. You can take hydrocodone every 6 hours as needed for severe pain.  Do not drive, drink alcohol, or take Tylenol taking this medication. You can take ibuprofen/Advil every 6 hours as needed for mild to moderate pain.  You can take this in addition to the hydrocodone as needed for added pain relief. Schedule appointment with the orthopedic specialist in 1 week for follow-up. Return to the ER for new or concerning symptoms.

## 2017-10-30 NOTE — ED Notes (Signed)
DC instructions reviewed with pt, teaching done re: safety while taking PO pain med, also importance of making and following up with Ortho MD as per EDP instructions. Teaching done re: use of sling and ice packs. Discussed when then need may arise to return to the ED. Wife with pt, opportunity for questions provided

## 2017-10-30 NOTE — ED Provider Notes (Signed)
[facility] EMERGENCY DEPARTMENT Provider Note   CSN: [id number] Arrival date & time: 10/30/17  1752     History   Chief Complaint Chief Complaint  Patient presents with  . Arm Injury    HPI Daniel Shepherd is a 22 y.o. male presenting to the ED with acute onset of right elbow injury that occurred around 3:30 PM this noon.  Patient states he was skateboarding and fell onto his right hand that was partially outstretched and then fell directly onto his right elbow.  He localizes pain to the posterior and lateral aspect of the elbow that is worse with movement.  No previous injuries to right elbow.  No wrist or shoulder pain.  No numbness or tingling in extremity.  No head trauma.  No other injuries or complaints.  Took ibuprofen prior to arrival with some improvement in pain.  The history is provided by the patient.    Past Medical History:  Diagnosis Date  . Hypertension    states recent high blood pressure during the last few weeks  . 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 Non-seminomatous testicular cancer, left ([place]) 07/07/2017  . Scrotum pain   . Testicular cancer [facility])     Patient Active Problem List   Diagnosis Date Noted  . Non-seminomatous testicular cancer, left ([place]) 07/07/2017  . 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 HYDROCELE EXCISION Left 07/03/2017   Procedure: HYDROCELECTOMY ADULT/ INGUINAL APPROACH/ TESTICULAR BIOPSY/ ORCHIECTOMY;  Surgeon: Kathie Rhodes, MD;  Location: [facility];  Service: Urology;  Laterality: Left;  . IR FLUORO GUIDE PORT INSERTION RIGHT  07/09/2017  . IR REMOVAL TUN ACCESS W/ PORT W/O FL MOD SED  10/12/2017  . IR US GUIDE VASC ACCESS RIGHT  07/09/2017  . NO PAST SURGERIES    . ORCHIECTOMY          Home Medications    Prior to Admission medications     Medication Sig Start Date End Date Taking? Authorizing Provider  acetaminophen (TYLENOL) 500 MG tablet Take 500 mg by mouth every 6 (six) hours as needed.   Yes [provider]  albuterol (PROVENTIL HFA;VENTOLIN HFA) 108 (90 Base) MCG/ACT inhaler Inhale 1-2 puffs into the lungs every 4 (four) hours as needed for wheezing or shortness of breath.    [provider]  HYDROcodone-acetaminophen (NORCO/VICODIN) 5-325 MG tablet Take 1-2 tablets by mouth every 6 (six) hours as needed for severe pain. 10/30/17   Robinson, [place] N, PA-C  ibuprofen (ADVIL,MOTRIN) 400 MG tablet Take 400 mg by mouth every 6 (six) hours as needed.    [provider]  loratadine (CLARITIN) 10 MG tablet Take 10 mg by mouth daily.    [provider]  LORazepam (ATIVAN) 0.5 MG tablet Take 1 tablet (0.5 mg total) by mouth every 6 (six) hours as needed (Nausea or vomiting). Patient not taking: Reported on 10/27/2017 07/10/17   [name], MD  LORazepam (ATIVAN) 1 MG tablet Take 1 tablet (1 mg total) by mouth every 8 (eight) hours as needed for anxiety. 07/07/17   Molpus, John, MD  ondansetron (ZOFRAN) 8 MG tablet Take 1 tablet (8 mg total) by mouth 2 (two) times daily as needed. Start on the third day after cisplatin chemotherapy. Patient not taking: Reported on 10/27/2017 07/10/17   [name], MD  prochlorperazine (COMPAZINE)  10 MG tablet Take 1 tablet (10 mg total) by mouth every 6 (six) hours as needed (Nausea or vomiting). Patient not taking: Reported on 10/27/2017 08/24/17   [name], MD  promethazine (PHENERGAN) 25 MG tablet TK 1 T PO Q 4 TO 6 H 08/09/17   [provider]    Family History No family history on file.  Social History Social History   Tobacco Use  . Smoking status: Never Smoker  . Smokeless tobacco: Never Used  Substance Use Topics  . Alcohol use: Yes    Comment: seldom  . Drug use: No     Allergies   Patient has no known  allergies.   Review of Systems Review of Systems  Musculoskeletal: Positive for arthralgias and joint swelling.  Skin: Negative for wound.  Neurological: Negative for numbness.     Physical Exam Updated Vital Signs BP 136/76 (BP Location: Left Arm)   Pulse 79   Temp 98.7 F (37.1 C) (Oral)   Resp 16   Ht 6\' 1"  (1.854 m)   Wt 88.5 kg (195 lb)   SpO2 100%   BMI 25.73 kg/m   Physical Exam  Constitutional: He appears well-developed and well-nourished. No distress.  HENT:  Head: Normocephalic and atraumatic.  Eyes: Conjunctivae are normal.  Cardiovascular: Normal rate and intact distal pulses.  Pulmonary/Chest: Effort normal.  Musculoskeletal:  Right elbow with edema. No obvious deformity. No wounds. No bony tenderness. Pain with passive supination, and active flexion/extension. Normal ROM of shoulder and wrist without tenderness. Intact sensation. Brisk cap refill.  Psychiatric: He has a normal mood and affect. His behavior is normal.  Nursing note and vitals reviewed.    ED Treatments / Results  Labs (all labs ordered are listed, but only abnormal results are displayed) Labs Reviewed - No data to display  EKG None  Radiology Dg Elbow Complete Right  Result Date: 10/30/2017 CLINICAL DATA:  Acute RIGHT elbow pain following fall today. Initial encounter. EXAM: RIGHT ELBOW - COMPLETE 3+ VIEW COMPARISON:  None. FINDINGS: A nondisplaced radial head fracture is identified. A joint effusion is noted. No other fracture, subluxation or dislocation identified. IMPRESSION: Nondisplaced radial head fracture with joint effusion. Electronically Signed   By: Margarette Canada M.D.   On: 10/30/2017 18:31    Procedures Procedures (including critical care time)  SPLINT APPLICATION Date/Time: 7:12 PM Authorized by: [place] N Robinson Consent: Verbal consent obtained. Risks and benefits: risks, benefits and alternatives were discussed Consent given by: patient Splint applied by:  EMT Location details: right arm Splint type: posterior long arm Supplies used: ace wrap, orthoglass Post-procedure: The splinted body part was neurovascularly unchanged following the procedure. Patient tolerance: Patient tolerated the procedure well with no immediate complications.   Medications Ordered in ED Medications  HYDROcodone-acetaminophen (NORCO/VICODIN) 5-325 MG per tablet 1 tablet (1 tablet Oral Given 10/30/17 1917)     Initial Impression / Assessment and Plan / ED Course  I have reviewed the triage vital signs and the nursing notes.  Pertinent labs & imaging results that were available during my care of the patient were reviewed by me and considered in my medical decision making (see chart for details).     Patient with acute closed nondisplaced right radial head fracture. Pain managed in ED. Neurovascularly intact, no other injuries.  Posterior long-arm splint applied with sling.  Pt advised to follow up with orthopedics for further evaluation and treatment.  Pain managed in the department. Conservative therapy recommended and discussed.  Patient will be dc home & is agreeable with above plan. I have also discussed reasons to return immediately to the ER.  Patient expresses understanding and agrees with plan.  [place] Controlled Substance reporting System queried  Patient discussed with Dr. Ralene Bathe.  Discussed results, findings, treatment and follow up. Patient advised of return precautions. Patient verbalized understanding and agreed with plan.  Final Clinical Impressions(s) / ED Diagnoses   Final diagnoses:  Closed nondisplaced fracture of head of right radius, initial encounter    ED Discharge Orders        Ordered    HYDROcodone-acetaminophen (NORCO/VICODIN) 5-325 MG tablet  Every 6 hours PRN     10/30/17 1924       Robinson, [place] N, PA-C 10/30/17 1946    Quintella Reichert, MD 10/31/17 1447

## 2017-10-30 NOTE — ED Notes (Signed)
Ice pack to rt elbow initiated

## 2017-10-30 NOTE — ED Notes (Signed)
Presents with rt elbow pain secondary to falling on concrete from skateboarding.

## 2017-10-30 NOTE — ED Notes (Signed)
Splint applied along with sling, good cap refill still noted, fingers warm and dry, denies any numbness or tingling

## 2017-10-30 NOTE — ED Notes (Signed)
Ambulated to radiology for x-rays per EDP orders

## 2017-10-30 NOTE — ED Triage Notes (Signed)
R elbow injury today. He fell off of the skateboard.

## 2017-11-04 ENCOUNTER — Encounter (INDEPENDENT_AMBULATORY_CARE_PROVIDER_SITE_OTHER): Payer: Self-pay | Admitting: Orthopaedic Surgery

## 2017-11-04 ENCOUNTER — Ambulatory Visit (INDEPENDENT_AMBULATORY_CARE_PROVIDER_SITE_OTHER): Payer: Managed Care, Other (non HMO) | Admitting: Orthopaedic Surgery

## 2017-11-04 DIAGNOSIS — S52121A Displaced fracture of head of right radius, initial encounter for closed fracture: Secondary | ICD-10-CM

## 2017-11-04 NOTE — Progress Notes (Signed)
Office Visit Note   Patient: Daniel Shepherd           Date of Birth: [birth year]           MRN: [id number] Visit Date: 11/04/2017              Requested by: No referring provider defined for this encounter. PCP: Patient, No Pcp Per   Assessment & Plan: Visit Diagnoses:  1. Closed displaced fracture of head of right radius, initial encounter     Plan: He understands that he will need to be compliant with decreasing his mobility with the elbow and no heavy lifting.  He can come in and out of the splint for hygiene purposes and starting this next Monday he can completely be out of the splint.  Even when he is out of splint though he needs to limit heavy lifting and activities without elbow.  We will see him back in 2 weeks with a repeat 3 views AP, lateral, oblique of the right elbow.  All questions concerns were answered and addressed.  Follow-Up Instructions: Return in about 2 weeks (around 11/18/2017).   Orders:  No orders of the defined types were placed in this encounter.  No orders of the defined types were placed in this encounter.     Procedures: No procedures performed   Clinical Data: No additional findings.   Subjective: Chief Complaint  Patient presents with  . Right Elbow - Injury  The patient is a right-hand-dominant male who injured his right elbow 5 days ago when he fell off a skateboard.  He was seen at [facility] and found to have a nondisplaced right radial head fracture and was placed appropriately in a posterior splint.  He has been compliant wearing the splint since then.  He only has moderate pain he says is not severe.  He does work in [employer] and has been out since then.  He said his Psychiatrist is likely willing to work with him.  He denies any numbness and tingling in his hands or any shoulder pain.  He has had no previous injuries to that right elbow.  HPI  Review of Systems He currently denies any headache, chest pain,  shortness of breath, fever, chills, nausea, vomiting.  Objective: Vital Signs: There were no vitals taken for this visit.  Physical Exam Is alert and oriented x3 and in no acute distress Ortho Exam Examination of his right elbow shows pain with flexion extension as well as pronation supination.  He is neurovascularly intact.  There is extensive bruising around his elbow and a moderate effusion.  Distally his motor and sensory exam is normal in his hand.  X-rays independently review of his right elbow show a nondisplaced radial head fracture Specialty Comments:  No specialty comments available.  Imaging No results found. X-rays independently reviewed of the elbow show a nondisplaced radial head fracture that involves about 20% of the articular surface.  PMFS History: Patient Active Problem List   Diagnosis Date Noted  . Non-seminomatous testicular cancer, left ([place]) 07/07/2017  . 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) 07/07/2017   Past Medical History:  Diagnosis Date  . Hypertension    states recent high blood pressure during the last few weeks  . 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 Non-seminomatous testicular cancer, left ([place]) 07/07/2017  . Scrotum pain   . Testicular cancer ([place])  History reviewed. No pertinent family history.  Past Surgical History:  Procedure Laterality Date  . HYDROCELE EXCISION Left 07/03/2017   Procedure: HYDROCELECTOMY ADULT/ INGUINAL APPROACH/ TESTICULAR BIOPSY/ ORCHIECTOMY;  Surgeon: Kathie Rhodes, MD;  Location: [place];  Service: Urology;  Laterality: Left;  . IR FLUORO GUIDE PORT INSERTION RIGHT  07/09/2017  . IR REMOVAL TUN ACCESS W/ PORT W/O FL MOD SED  10/12/2017  . IR US GUIDE VASC ACCESS RIGHT  07/09/2017  . NO PAST SURGERIES    . ORCHIECTOMY     Social History   Occupational History  . Not on file  Tobacco Use  . Smoking  status: Never Smoker  . Smokeless tobacco: Never Used  Substance and Sexual Activity  . Alcohol use: Yes    Comment: seldom  . Drug use: No  . Sexual activity: Not on file

## 2017-12-24 ENCOUNTER — Telehealth: Payer: Self-pay | Admitting: *Deleted

## 2017-12-24 DIAGNOSIS — C6292 Malignant neoplasm of left testis, unspecified whether descended or undescended: Secondary | ICD-10-CM

## 2017-12-24 NOTE — Telephone Encounter (Signed)
Patient is wanting a referral to a cardiologist for transient chest pain and pressure through out the day. He does not have a PCP.  He states he's been having chest pain and pressure off and on through out day. He denies anything making it worse, such as activity or eating. He denies shortness of breath.   Spoke with Dr Marin Olp. He will place a referral to a cardiologist.   Patient is aware that referral has been sent. He is also instructed to go to the ED if chest pain becomes severe or occurs with shortness of breath, dizziness or n/v.

## 2018-01-05 ENCOUNTER — Ambulatory Visit (INDEPENDENT_AMBULATORY_CARE_PROVIDER_SITE_OTHER): Payer: Managed Care, Other (non HMO) | Admitting: Cardiology

## 2018-01-05 ENCOUNTER — Encounter: Payer: Self-pay | Admitting: Cardiology

## 2018-01-05 VITALS — BP 132/80 | HR 79 | Ht 73.0 in | Wt 202.8 lb

## 2018-01-05 DIAGNOSIS — C7802 Secondary malignant neoplasm of left lung: Secondary | ICD-10-CM | POA: Diagnosis not present

## 2018-01-05 DIAGNOSIS — C801 Malignant (primary) neoplasm, unspecified: Secondary | ICD-10-CM | POA: Diagnosis not present

## 2018-01-05 DIAGNOSIS — I440 Atrioventricular block, first degree: Secondary | ICD-10-CM | POA: Diagnosis not present

## 2018-01-05 DIAGNOSIS — R0789 Other chest pain: Secondary | ICD-10-CM | POA: Diagnosis not present

## 2018-01-05 NOTE — Patient Instructions (Signed)
Medication Instructions:  Your physician recommends that you continue on your current medications as directed. Please refer to the Current Medication list given to you today.   Labwork: None.  Testing/Procedures:  You had an EKG today.  Your physician has requested that you have an echocardiogram. Echocardiography is a painless test that uses sound waves to create images of your heart. It provides your doctor with information about the size and shape of your heart and how well your heart's chambers and valves are working. This procedure takes approximately one hour. There are no restrictions for this procedure.  Your physician has recommended that you wear a holter monitor. Holter monitors are medical devices that record the heart's electrical activity. Doctors most often use these monitors to diagnose arrhythmias. Arrhythmias are problems with the speed or rhythm of the heartbeat. The monitor is a small, portable device. You can wear one while you do your normal daily activities. This is usually used to diagnose what is causing palpitations/syncope (passing out). Wear for 24 hours.  Follow-Up: Your physician recommends that you schedule a follow-up appointment in: 1 month.   Any Other Special Instructions Will Be Listed Below (If Applicable).     If you need a refill on your cardiac medications before your next appointment, please call your pharmacy.  Echocardiogram An echocardiogram, or echocardiography, uses sound waves (ultrasound) to produce an image of your heart. The echocardiogram is simple, painless, obtained within a short period of time, and offers valuable information to your health care provider. The images from an echocardiogram can provide information such as:  Evidence of coronary artery disease (CAD).  Heart size.  Heart muscle function.  Heart valve function.  Aneurysm detection.  Evidence of a past heart attack.  Fluid buildup around the heart.  Heart  muscle thickening.  Assess heart valve function.  Tell a health care provider about:  Any allergies you have.  All medicines you are taking, including vitamins, herbs, eye drops, creams, and over-the-counter medicines.  Any problems you or family members have had with anesthetic medicines.  Any blood disorders you have.  Any surgeries you have had.  Any medical conditions you have.  Whether you are pregnant or may be pregnant. What happens before the procedure? No special preparation is needed. Eat and drink normally. What happens during the procedure?  In order to produce an image of your heart, gel will be applied to your chest and a wand-like tool (transducer) will be moved over your chest. The gel will help transmit the sound waves from the transducer. The sound waves will harmlessly bounce off your heart to allow the heart images to be captured in real-time motion. These images will then be recorded.  You may need an IV to receive a medicine that improves the quality of the pictures. What happens after the procedure? You may return to your normal schedule including diet, activities, and medicines, unless your health care provider tells you otherwise. This information is not intended to replace advice given to you by your health care provider. Make sure you discuss any questions you have with your health care provider. Document Released: 05/30/2000 Document Revised: 01/19/2016 Document Reviewed: 02/07/2013 Elsevier Interactive Patient Education  2017 [employer].   Holter Monitoring A Holter monitor is a small device that is used to detect abnormal heart rhythms. It clips to your clothing and is connected by wires to flat, sticky disks (electrodes) that attach to your chest. It is worn continuously for 24-48 hours. Follow  these instructions at home:  Wear your Holter monitor at all times, even while exercising and sleeping, for as long as directed by your health care  provider.  Make sure that the Holter monitor is safely clipped to your clothing or close to your body as recommended by your health care provider.  Do not get the monitor or wires wet.  Do not put body lotion or moisturizer on your chest.  Keep your skin clean.  Keep a diary of your daily activities, such as walking and doing chores. If you feel that your heartbeat is abnormal or that your heart is fluttering or skipping a beat: ? Record what you are doing when it happens. ? Record what time of day the symptoms occur.  Return your Holter monitor as directed by your health care provider.  Keep all follow-up visits as directed by your health care provider. This is important. Get help right away if:  You feel lightheaded or you faint.  You have trouble breathing.  You feel pain in your chest, upper arm, or jaw.  You feel sick to your stomach and your skin is pale, cool, or damp.  You heartbeat feels unusual or abnormal. This information is not intended to replace advice given to you by your health care provider. Make sure you discuss any questions you have with your health care provider. Document Released: 02/29/2004 Document Revised: 11/08/2015 Document Reviewed: 01/09/2014 Elsevier Interactive Patient Education  [employer].

## 2018-01-05 NOTE — Progress Notes (Signed)
Cardiology Consultation:    Date:  01/05/2018   ID:  Daniel Shepherd, DOB [birth year], MRN [id number]  PCP:  Patient, No Pcp Per  Cardiologist:  Jenne Campus, MD   Referring MD: No ref. provider found   Chief Complaint  Patient presents with  . Chest Pain    Off and on since chemo, also having elevated blood pressure  I have some pain and elevated blood pressure  History of Present Illness:    Daniel Shepherd is a 22 y.o. male who is being seen today for the evaluation of chest pain hypertension at the request of No ref. provider found.  At the beginning of this year he was diagnosed with testicular cancer he was also discovered to have metastasis to lungs he is received multiple courses of chemotherapy doing much better overall his prognosis appears to be good from that point review however since the chemotherapy complaining of having some chest pain pain is atypical not related to exertion typically happen after he eats and last for few minutes he graded the pain as 4-5 and scale up to 10.  There is no shortness of breath there is no sweating associated with this sensation.  At the same time he is very active he practices jujitsu, he also like to write a mountain bike and have no difficulty doing it.  Recently he was also find to have somewhat elevated blood pressure and this is a concern to him.  Denies having any dizziness but had one episode of syncope that happened in May when he was in the right concert.  He was standing in a very crowded environment with a lot of noise and started feeling dizzy and woozy sweaty and then he ended up falling down.  When he woke up he was in the medical tent.  He never had situation like this before.  Past Medical History:  Diagnosis Date  . Hypertension    states recent high blood pressure during the last few weeks  . 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 Non-seminomatous testicular cancer, left ([place])  07/07/2017  . Scrotum pain   . Testicular cancer [facility])     Past Surgical History:  Procedure Laterality Date  . HYDROCELE EXCISION Left 07/03/2017   Procedure: HYDROCELECTOMY ADULT/ INGUINAL APPROACH/ TESTICULAR BIOPSY/ ORCHIECTOMY;  Surgeon: Kathie Rhodes, MD;  Location: [place];  Service: Urology;  Laterality: Left;  . IR FLUORO GUIDE PORT INSERTION RIGHT  07/09/2017  . IR REMOVAL TUN ACCESS W/ PORT W/O FL MOD SED  10/12/2017  . IR US GUIDE VASC ACCESS RIGHT  07/09/2017  . NO PAST SURGERIES    . ORCHIECTOMY      Current Medications: No outpatient medications have been marked as taking for the 01/05/18 encounter (Office Visit) with Park Liter, MD.     Allergies:   Patient has no known allergies.   Social History   Socioeconomic History  . Marital status: Married    Spouse name: Not on file  . Number of children: Not on file  . Years of education: Not on file  . Highest education level: Not on file  Occupational History  . Not on file  Social Needs  . Financial resource strain: Not on file  . Food insecurity:    Worry: Not on file    Inability: Not on file  . Transportation needs:    Medical: Not on file    Non-medical: Not on  file  Tobacco Use  . Smoking status: Never Smoker  . Smokeless tobacco: Never Used  Substance and Sexual Activity  . Alcohol use: Yes    Comment: seldom  . Drug use: No  . Sexual activity: Not on file  Lifestyle  . Physical activity:    Days per week: Not on file    Minutes per session: Not on file  . Stress: Not on file  Relationships  . Social connections:    Talks on phone: Not on file    Gets together: Not on file    Attends religious service: Not on file    Active member of club or organization: Not on file    Attends meetings of clubs or organizations: Not on file    Relationship status: Not on file  Other Topics Concern  . Not on file  Social History Narrative  . Not on file     Family History: The  patient's family history includes Healthy in his father and mother. ROS:   Please see the history of present illness.    All 14 point review of systems negative except as described per history of present illness.  EKGs/Labs/Other Studies Reviewed:    The following studies were reviewed today: CT of the chest reviewe  EKG:  EKG is  ordered today.  The ekg ordered today demonstrates normal sinus rhythm rate 60 first-degree AV block right axis deviation no ST segment changes  Recent Labs: 10/27/2017: ALT 47; BUN 8; Creatinine 0.80; Hemoglobin 13.9; Platelet Count 186; Potassium 3.1; Sodium 140  Recent Lipid Panel No results found for: CHOL, TRIG, HDL, CHOLHDL, VLDL, LDLCALC, LDLDIRECT  Physical Exam:    VS:  BP 132/80   Pulse 79   Ht 6\' 1"  (1.854 m)   Wt 202 lb 12.8 oz (92 kg)   SpO2 97%   BMI 26.76 kg/m     Wt Readings from Last 3 Encounters:  01/05/18 202 lb 12.8 oz (92 kg)  10/30/17 195 lb (88.5 kg)  10/27/17 192 lb (87.1 kg)     GEN:  Well nourished, well developed in no acute distress HEENT: Normal NECK: No JVD; No carotid bruits LYMPHATICS: No lymphadenopathy CARDIAC: RRR, no murmurs, no rubs, no gallops RESPIRATORY:  Clear to auscultation without rales, wheezing or rhonchi  ABDOMEN: Soft, non-tender, non-distended MUSCULOSKELETAL:  No edema; No deformity  SKIN: Warm and dry NEUROLOGIC:  Alert and oriented x 3 PSYCHIATRIC:  Normal affect   ASSESSMENT:    1. 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 Atypical chest pain   3. First degree AV block    PLAN:    In order of problems listed above:  1. Atypical chest pain.  Happening after eating.  Not related to exercise at the same time he is very active around bike and have no difficulty doing this at very much doubt in coronary artery disease. 2. First-degree AV block obviously surprising finding.  I will ask him to wear Holter monitor for 24 hours to see if there is any  worsening of AV block during the night.  Also I advised him to exercise when he wear monitor so we will see what his chronotropic response is.  I will also ask him to have an echocardiogram to assess function of his heart.  He tells me when he was a small he was told to have a heart murmur however I cannot hear any murmur today. 3. Metastatic testicular cancer.  Doing well from that point review on the current of oncology team.  He is doing very well from that point review and prognosis should be good.  See him back in my office in about 1 month or sooner if he get a problem   Medication Adjustments/Labs and Tests Ordered: Current medicines are reviewed at length with the patient today.  Concerns regarding medicines are outlined above.  No orders of the defined types were placed in this encounter.  No orders of the defined types were placed in this encounter.   Signed, Park Liter, MD, [facility]. 01/05/2018 12:03 PM    [place]

## 2018-01-26 ENCOUNTER — Other Ambulatory Visit: Payer: Self-pay

## 2018-01-26 ENCOUNTER — Ambulatory Visit (HOSPITAL_BASED_OUTPATIENT_CLINIC_OR_DEPARTMENT_OTHER)
Admission: RE | Admit: 2018-01-26 | Discharge: 2018-01-26 | Disposition: A | Discharge status: Home / Self Care | Payer: Managed Care, Other (non HMO) | Source: Ambulatory Visit | Attending: Hematology & Oncology | Admitting: Hematology & Oncology

## 2018-01-26 ENCOUNTER — Inpatient Hospital Stay: Payer: Managed Care, Other (non HMO) | Attending: Hematology & Oncology | Admitting: Hematology & Oncology

## 2018-01-26 ENCOUNTER — Encounter (HOSPITAL_BASED_OUTPATIENT_CLINIC_OR_DEPARTMENT_OTHER): Payer: Self-pay

## 2018-01-26 ENCOUNTER — Inpatient Hospital Stay: Payer: Managed Care, Other (non HMO)

## 2018-01-26 ENCOUNTER — Encounter: Payer: Self-pay | Admitting: Hematology & Oncology

## 2018-01-26 VITALS — BP 135/83 | HR 62 | Temp 97.8°F | Resp 19 | Wt 207.0 lb

## 2018-01-26 DIAGNOSIS — C7802 Secondary malignant neoplasm of left lung: Secondary | ICD-10-CM | POA: Insufficient documentation

## 2018-01-26 DIAGNOSIS — I440 Atrioventricular block, first degree: Secondary | ICD-10-CM | POA: Insufficient documentation

## 2018-01-26 DIAGNOSIS — Z9079 Acquired absence of other genital organ(s): Secondary | ICD-10-CM | POA: Insufficient documentation

## 2018-01-26 DIAGNOSIS — Z9221 Personal history of antineoplastic chemotherapy: Secondary | ICD-10-CM | POA: Insufficient documentation

## 2018-01-26 DIAGNOSIS — C801 Malignant (primary) neoplasm, unspecified: Secondary | ICD-10-CM

## 2018-01-26 DIAGNOSIS — C7800 Secondary malignant neoplasm of unspecified lung: Secondary | ICD-10-CM | POA: Insufficient documentation

## 2018-01-26 DIAGNOSIS — C6292 Malignant neoplasm of left testis, unspecified whether descended or undescended: Secondary | ICD-10-CM

## 2018-01-26 LAB — CMP (CANCER CENTER ONLY)
ALT: 31 U/L (ref 10–47)
AST: 29 U/L (ref 11–38)
Albumin: 4.1 g/dL (ref 3.5–5.0)
Alkaline Phosphatase: 91 U/L — ABNORMAL HIGH (ref 26–84)
Anion gap: 6 (ref 5–15)
BILIRUBIN TOTAL: 0.9 mg/dL (ref 0.2–1.6)
BUN: 15 mg/dL (ref 7–22)
CALCIUM: 9.4 mg/dL (ref 8.0–10.3)
CO2: 30 mmol/L (ref 18–33)
CREATININE: 0.8 mg/dL (ref 0.60–1.20)
Chloride: 102 mmol/L (ref 98–108)
GLUCOSE: 103 mg/dL (ref 73–118)
Potassium: 3.9 mmol/L (ref 3.3–4.7)
Sodium: 138 mmol/L (ref 128–145)
Total Protein: 7.6 g/dL (ref 6.4–8.1)

## 2018-01-26 LAB — CBC WITH DIFFERENTIAL (CANCER CENTER ONLY)
Basophils Absolute: 0 10*3/uL (ref 0.0–0.1)
Basophils Relative: 1 %
EOS ABS: 0.1 10*3/uL (ref 0.0–0.5)
EOS PCT: 3 %
HCT: 43 % (ref 38.7–49.9)
HEMOGLOBIN: 14.7 g/dL (ref 13.0–17.1)
LYMPHS PCT: 37 %
Lymphs Abs: 1.5 10*3/uL (ref 0.9–3.3)
MCH: 30.5 pg (ref 28.0–33.4)
MCHC: 34.2 g/dL (ref 32.0–35.9)
MCV: 89.2 fL (ref 82.0–98.0)
MONOS PCT: 9 %
Monocytes Absolute: 0.4 10*3/uL (ref 0.1–0.9)
NEUTROS PCT: 50 %
Neutro Abs: 2 10*3/uL (ref 1.5–6.5)
Platelet Count: 211 10*3/uL (ref 145–400)
RBC: 4.82 MIL/uL (ref 4.20–5.70)
RDW: 11.6 % (ref 11.1–15.7)
WBC: 4 10*3/uL (ref 4.0–10.0)

## 2018-01-26 LAB — LACTATE DEHYDROGENASE: LDH: 188 U/L (ref 98–192)

## 2018-01-26 MED ORDER — IOPAMIDOL (ISOVUE-300) INJECTION 61%
100.0000 mL | Freq: Once | INTRAVENOUS | Status: AC | PRN
  Administered 2018-01-26: 100 mL via INTRAVENOUS

## 2018-01-26 NOTE — Progress Notes (Signed)
Hematology and Oncology Follow Up Visit  Evaan Tidwell [id number] 10/05/1995 22 y.o. 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 Stancil is  back for follow-up.  It is birthday today.  I am sad that he had to be her on his birthday.  He is yet to have his CT scan done today.  He will have this done after I see him.  He has had a good summer.  He actually broke his right elbow.  He only had a splint.  He is doing better with this right now.  His last scans look fine.  He had normal tumor markers when we last saw him.  He has had some chest discomfort.  He actually is seeing a cardiologist.  He has first-degree AV block.  He says he is gone for a Holter monitor I think this week or next.  He has not had any change in bowel or bladder habits.  He has had no problems with fever.  He has had no rashes.  There is been no leg swelling.  Overall, his performance status is ECOG 0. He has had no cough.  He has had no problems with bowels or bladder.  His appetite is doing better.  His taste for food is improving.  Overall, his performance status is ECOG 0.  Medications: No current outpatient medications on file. No current facility-administered medications for this visit.   Facility-Administered Medications Ordered in Other Visits:  .  pegfilgrastim (NEULASTA ONPRO KIT) injection 6 mg, 6 mg, Subcutaneous, Once, [place], Holli Humbles, NP  Allergies: No Known Allergies  Past Medical History, Surgical history, Social history, and Family History were reviewed and updated.  Review of Systems: Review of Systems  Constitutional: Positive for fatigue.  HENT:  Negative.   Eyes: Negative.   Respiratory: Positive for shortness of breath.   Cardiovascular: Negative.   Gastrointestinal: Positive for nausea.  Endocrine:  Negative.   Genitourinary: Negative.    Musculoskeletal: Negative.   Skin: Negative.   Neurological: Negative.   Hematological: Negative.   Psychiatric/Behavioral: Negative.     Physical Exam:  weight is 207 lb (93.9 kg). His oral temperature is 97.8 F (36.6 C). His blood pressure is 135/83 and his pulse is 62. His respiration is 19 and oxygen saturation is 100%.   Wt Readings from Last 3 Encounters:  01/26/18 207 lb (93.9 kg)  01/05/18 202 lb 12.8 oz (92 kg)  10/30/17 195 lb (88.5 kg)    Physical Exam  Constitutional: He is oriented to person, place, and time.  HENT:  Head: Normocephalic and atraumatic.  Mouth/Throat: Oropharynx is clear and moist.  Eyes: Pupils are equal, round, and reactive to light. EOM are normal.  Neck: Normal range of motion.  Cardiovascular: Normal rate, regular rhythm and normal heart sounds.  Pulmonary/Chest: Effort normal and breath sounds normal.  Abdominal: Soft. Bowel sounds are normal.  Musculoskeletal: Normal range of motion. He exhibits no edema, tenderness or deformity.  Lymphadenopathy:    He has no cervical adenopathy.  Neurological: He is alert and oriented to person, place, and time.  Skin: Skin is warm and dry. No rash noted. No erythema.  Psychiatric: He has a normal mood and affect. His behavior is normal. Judgment and thought content normal.  Vitals reviewed.    Lab Results  Component Value Date   WBC 4.0 01/26/2018   HGB 14.7 01/26/2018   HCT 43.0 01/26/2018   MCV 89.2 01/26/2018   PLT 211 01/26/2018     Chemistry      Component Value Date/Time   NA 138 01/26/2018 0921   K 3.9 01/26/2018 0921   CL 102 01/26/2018 0921   CO2 30 01/26/2018 0921   BUN 15 01/26/2018 0921   CREATININE 0.80 01/26/2018 0921      Component Value Date/Time   CALCIUM 9.4 01/26/2018 0921   ALKPHOS 91 (H) 01/26/2018 0921   AST 29 01/26/2018 0921   ALT 31 01/26/2018 0921   BILITOT 0.9 01/26/2018 0921      Impression and Plan: Mr. Shimabukuro  is a 22 year old white male with metastatic embryonal cell carcinoma.  He had a left orchiectomy.  This was performed back on January 18.  A week later, he showed up in the emergency room with chest discomfort and some shortness of breath.  He had innumerable pulmonary nodules.  I have to believe that his CT scan and tumor markers will still be normal.  He had good risk nonseminomatous germ cell tumor.  We probably need to do another scan.  This will be done before the end of the year.  I will get this set up in December.  I am glad that he will enjoy his birthday today.  He is now out from treatment by close to 6 months.  [name], MD 8/13/201911:02 AM

## 2018-01-27 ENCOUNTER — Telehealth: Payer: Self-pay | Admitting: *Deleted

## 2018-01-27 LAB — BETA HCG QUANT (REF LAB): hCG Quant: <1 m[IU]/mL (ref 0–3)

## 2018-01-27 LAB — AFP TUMOR MARKER: AFP, Serum, Tumor Marker: 4.2 ng/mL (ref 0.0–8.3)

## 2018-01-27 NOTE — Telephone Encounter (Addendum)
Patient is aware of results  ----- Message from [name], MD sent at 01/27/2018  6:30 AM EDT ----- Call - NO recurrent cancer on the ct scan!!!  Daniel Shepherd

## 2018-02-08 ENCOUNTER — Other Ambulatory Visit: Payer: Self-pay | Admitting: Cardiology

## 2018-02-08 DIAGNOSIS — R55 Syncope and collapse: Secondary | ICD-10-CM

## 2018-02-08 DIAGNOSIS — R0789 Other chest pain: Secondary | ICD-10-CM

## 2018-02-08 DIAGNOSIS — I440 Atrioventricular block, first degree: Secondary | ICD-10-CM

## 2018-02-10 ENCOUNTER — Other Ambulatory Visit: Payer: Managed Care, Other (non HMO)

## 2018-04-06 ENCOUNTER — Encounter: Payer: Self-pay | Admitting: Medical

## 2018-04-06 ENCOUNTER — Ambulatory Visit (HOSPITAL_BASED_OUTPATIENT_CLINIC_OR_DEPARTMENT_OTHER)
Admission: RE | Admit: 2018-04-06 | Discharge: 2018-04-06 | Disposition: A | Discharge status: Home / Self Care | Payer: Managed Care, Other (non HMO) | Source: Ambulatory Visit | Attending: Medical | Admitting: Medical

## 2018-04-06 ENCOUNTER — Ambulatory Visit (INDEPENDENT_AMBULATORY_CARE_PROVIDER_SITE_OTHER): Payer: Managed Care, Other (non HMO) | Admitting: Medical

## 2018-04-06 VITALS — BP 116/70 | HR 78 | Temp 98.7°F | Resp 16 | Ht 73.0 in | Wt 212.8 lb

## 2018-04-06 DIAGNOSIS — R0781 Pleurodynia: Secondary | ICD-10-CM | POA: Insufficient documentation

## 2018-04-06 DIAGNOSIS — C7802 Secondary malignant neoplasm of left lung: Secondary | ICD-10-CM

## 2018-04-06 DIAGNOSIS — M898X1 Other specified disorders of bone, shoulder: Secondary | ICD-10-CM | POA: Insufficient documentation

## 2018-04-06 DIAGNOSIS — C801 Malignant (primary) neoplasm, unspecified: Secondary | ICD-10-CM | POA: Diagnosis not present

## 2018-04-06 LAB — CBC WITH DIFFERENTIAL/PLATELET
BASOS PCT: 1 % (ref 0.0–3.0)
Basophils Absolute: 0 10*3/uL (ref 0.0–0.1)
EOS PCT: 2.6 % (ref 0.0–5.0)
Eosinophils Absolute: 0.1 10*3/uL (ref 0.0–0.7)
HCT: 40.9 % (ref 39.0–52.0)
Hemoglobin: 14.4 g/dL (ref 13.0–17.0)
LYMPHS ABS: 1.5 10*3/uL (ref 0.7–4.0)
Lymphocytes Relative: 30 % (ref 12.0–46.0)
MCHC: 35.2 g/dL (ref 30.0–36.0)
MCV: 91.1 fl (ref 78.0–100.0)
MONOS PCT: 7.7 % (ref 3.0–12.0)
Monocytes Absolute: 0.4 10*3/uL (ref 0.1–1.0)
NEUTROS ABS: 2.9 10*3/uL (ref 1.4–7.7)
NEUTROS PCT: 58.7 % (ref 43.0–77.0)
PLATELETS: 209 10*3/uL (ref 150.0–400.0)
RBC: 4.49 Mil/uL (ref 4.22–5.81)
RDW: 12.2 % (ref 11.5–15.5)
WBC: 5 10*3/uL (ref 4.0–10.5)

## 2018-04-06 MED ORDER — CYCLOBENZAPRINE HCL 10 MG PO TABS
10.0000 mg | ORAL_TABLET | Freq: Every day | ORAL | 0 refills | Status: DC
Start: 2018-04-06 — End: 2018-05-27

## 2018-04-06 MED ORDER — DICLOFENAC SODIUM 75 MG PO TBEC: 75.0000 mg | DELAYED_RELEASE_TABLET | Freq: Two times a day (BID) | ORAL | 0 refills | Status: DC

## 2018-04-06 NOTE — Progress Notes (Signed)
Subjective:    Patient ID: Daniel Shepherd, male    DOB: 08-11-1995, 22 y.o.   MRN: [id number]  HPI  Pt in for first time.  Pt works Surveyor, [employer]. Pt states working out/training for about 2 month.Pt states eats eats relatively healthy. He does smoke marijuana once a week. Drinks couple of times a month. Married.  Pt states he has some left scapula area pain.Pt has no fall or trauma recently. He reports remote history of shoulder pain after Ju Jitsu tornament 2-3 years ago. He reports only like slight pressure. Every now and then gets pulling pain. Thi pain has been noticeable last couple of weeks. Pt not sure but wonders if pain in scapula maybe related to old ju jitsu injury.   Pt states he had left testicle cancer stage 4. Pt states in January was evaluated for hydrocele. When they removed the hydrocele found cancer. Pt had chemotherapy for cancer in his lung. He had that late march. Pt sees  Dr. Marin Olp. Pt has CT chest and abdomen pelvis with contrast set up end of December.  Pt states when he had lung cancer left side he felt like he could not breath well. Like airway blocked to some degree. Pain recently admitted as different.  Pt states he woke up on Saturday night and left numbness. No pain just numbness. No neck pain. The numbness was on and off 2 hours. Then resolved.     Review of Systems  Constitutional: Negative for chills, fatigue and fever.  Respiratory: Negative for cough, chest tightness, shortness of breath and wheezing.   Cardiovascular: Negative for chest pain and palpitations.       Pt has had on and off chest pain for months since stopping chemo. Pt is seeing cardiologist.   Pt never got holter monitor or echocardiogram done as was plan.   Pt had not chest pain for about 4 weeks.  Skin: Negative for rash.  Neurological: Negative for dizziness, syncope, numbness and headaches.  Hematological: Negative for adenopathy. Does not bruise/bleed easily.    Psychiatric/Behavioral: Negative for behavioral problems, dysphoric mood and sleep disturbance.    Past Medical History:  Diagnosis Date  . Hypertension    states recent high blood pressure during the last few weeks  . 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 Non-seminomatous testicular cancer, left ([place]) 07/07/2017  . Scrotum pain   . Testicular cancer [facility])      Social History   Socioeconomic History  . Marital status: Married    Spouse name: Not on file  . Number of children: Not on file  . Years of education: Not on file  . Highest education level: Not on file  Occupational History  . Not on file  Social Needs  . Financial resource strain: Not on file  . Food insecurity:    Worry: Not on file    Inability: Not on file  . Transportation needs:    Medical: Not on file    Non-medical: Not on file  Tobacco Use  . Smoking status: Never Smoker  . Smokeless tobacco: Never Used  Substance and Sexual Activity  . Alcohol use: Yes    Comment: seldom  . Drug use: Yes    Types: Marijuana  . Sexual activity: Not on file  Lifestyle  . Physical activity:    Days per week: Not on file    Minutes per session: Not on file  . Stress: Not  on file  Relationships  . Social connections:    Talks on phone: Not on file    Gets together: Not on file    Attends religious service: Not on file    Active member of club or organization: Not on file    Attends meetings of clubs or organizations: Not on file    Relationship status: Not on file  . Intimate partner violence:    Fear of current or ex partner: Not on file    Emotionally abused: Not on file    Physically abused: Not on file    Forced sexual activity: Not on file  Other Topics Concern  . Not on file  Social History Narrative  . Not on file    Past Surgical History:  Procedure Laterality Date  . HYDROCELE EXCISION Left 07/03/2017   Procedure: HYDROCELECTOMY ADULT/  INGUINAL APPROACH/ TESTICULAR BIOPSY/ ORCHIECTOMY;  Surgeon: Kathie Rhodes, MD;  Location: [place];  Service: Urology;  Laterality: Left;  . IR FLUORO GUIDE PORT INSERTION RIGHT  07/09/2017  . IR REMOVAL TUN ACCESS W/ PORT W/O FL MOD SED  10/12/2017  . IR US GUIDE VASC ACCESS RIGHT  07/09/2017  . NO PAST SURGERIES    . ORCHIECTOMY      Family History  Problem Relation Age of Onset  . Healthy Mother   . Healthy Father     No Known Allergies  No current outpatient medications on file prior to visit.   Current Facility-Administered Medications on File Prior to Visit  Medication Dose Route Frequency Provider Last Rate Last Dose  . pegfilgrastim (NEULASTA ONPRO KIT) injection 6 mg  6 mg Subcutaneous Once [place], Sarah M, NP        BP 116/70   Pulse 78   Temp 98.7 F (37.1 C) (Oral)   Resp 16   Ht 6' 1"  (1.854 m)   Wt 212 lb 12.8 oz (96.5 kg)   SpO2 100%   BMI 28.08 kg/m      Objective:   Physical Exam  General Mental Status- Alert. General Appearance- Not in acute distress.   Skin General: Color- Normal Color. Moisture- Normal Moisture.  Neck Carotid Arteries- Normal color. Moisture- Normal Moisture. No carotid bruits. No JVD.  Chest and Lung Exam Auscultation: Breath Sounds:-Normal.(no pleuritic pain presently.)  Cardiovascular Auscultation:Rythm- Regular. Murmurs & Other Heart Sounds:Auscultation of the heart reveals- No Murmurs.  Abdomen Inspection:-Inspeection Normal. Palpation/Percussion:Note:No mass. Palpation and Percussion of the abdomen reveal- Non Tender, Non Distended + BS, no rebound or guarding.   Neurologic Cranial Nerve exam:- CN III-XII intact(No nystagmus), symmetric smile. Strength:- 5/5 equal and symmetric strength both upper and lower extremities.  Back- medial to scapula region pain. Pain on movement left upper ext. No rash seen.     Assessment & Plan:  For your recent occasional rib pain, left scapular pain and  transient pleuritic pain, I do want to get x-rays of your chest with rib views and left scapula x-ray.  In addition we will get CBC and metabolic panel.  We will proceed with caution due to your testicular cancer and lung cancer history.  Your pain may be muscular as some of muscles and back do appear tender to palpation and tender on range of motion of the left upper extremity.  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  Prescription of Flexeril to use at night and diclofenac NSAID Rx as well.  Can use a lidocaine patch over scapular  area or you could try CBD oil as you question.  We will review studies and send note to Dr. Marin Olp informing him of the recent work up and see if based on your symptoms if he would consider possibly moving up CT chest and abdomen/pelvis studies.  Also please go upstairs after labs and get the echocardiogram and Holter monitor study scheduled.  Follow-up in 10 to 14 days or as needed.  45 minutes spent with pt. 50% of time spent with patient counseling him on work up process to evaluate msk pain vs possible but unlikely recurrent cancer as this was his concern based on history. Counseled with him on treatment for muscle pain but also explained will update his oncologist on work up.

## 2018-04-06 NOTE — Patient Instructions (Addendum)
For your recent occasional rib pain, left scapular pain and transient pleuritic pain, I do want to get x-rays of your chest with rib views and left scapula x-ray.  In addition we will get CBC and metabolic panel.  We will proceed with caution due to your testicular cancer and lung cancer history.  Your pain may be muscular as some of muscles and back do appear tender to palpation and tender on range of motion of the left upper extremity.  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  Prescription of Flexeril to use at night and diclofenac NSAID Rx as well.  Can use a lidocaine patch over scapular area or you could try CBD oil as you question.  We will review studies and send note to Dr. Marin Olp informing him of the recent work up and see if based on your symptoms if he would consider possibly moving up CT chest and abdomen/pelvis studies.  Also please go upstairs after labs and get the echocardiogram and Holter monitor study scheduled.  Mackie Pai, PA-C  Follow-up in 10 to 14 days or as needed.

## 2018-04-07 LAB — COMPREHENSIVE METABOLIC PANEL
ALBUMIN: 4.7 g/dL (ref 3.5–5.2)
ALK PHOS: 75 U/L (ref 39–117)
ALT: 26 U/L (ref 0–53)
AST: 22 U/L (ref 0–37)
BUN: 15 mg/dL (ref 6–23)
CALCIUM: 9.3 mg/dL (ref 8.4–10.5)
CHLORIDE: 103 meq/L (ref 96–112)
CO2: 32 mEq/L (ref 19–32)
Creatinine, Ser: 1 mg/dL (ref 0.40–1.50)
GFR: 97.41 mL/min (ref >60.00)
Glucose, Bld: 81 mg/dL (ref 70–99)
POTASSIUM: 4.4 meq/L (ref 3.5–5.1)
SODIUM: 139 meq/L (ref 135–145)
Total Bilirubin: 0.7 mg/dL (ref 0.2–1.2)
Total Protein: 7.2 g/dL (ref 6.0–8.3)

## 2018-04-08 ENCOUNTER — Ambulatory Visit: Payer: Managed Care, Other (non HMO)

## 2018-04-08 ENCOUNTER — Ambulatory Visit: Payer: Managed Care, Other (non HMO) | Admitting: Medical

## 2018-04-08 DIAGNOSIS — R0789 Other chest pain: Secondary | ICD-10-CM

## 2018-04-08 DIAGNOSIS — R55 Syncope and collapse: Secondary | ICD-10-CM

## 2018-04-08 DIAGNOSIS — I440 Atrioventricular block, first degree: Secondary | ICD-10-CM

## 2018-05-26 ENCOUNTER — Ambulatory Visit: Payer: Managed Care, Other (non HMO) | Admitting: Medical

## 2018-05-26 DIAGNOSIS — Z0289 Encounter for other administrative examinations: Secondary | ICD-10-CM

## 2018-05-27 ENCOUNTER — Ambulatory Visit: Payer: Managed Care, Other (non HMO) | Admitting: Medical

## 2018-05-27 ENCOUNTER — Encounter: Payer: Self-pay | Admitting: Medical

## 2018-05-27 VITALS — BP 137/7 | HR 71 | Temp 98.8°F | Resp 16 | Ht 73.0 in | Wt 220.0 lb

## 2018-05-27 DIAGNOSIS — S060X0A Concussion without loss of consciousness, initial encounter: Secondary | ICD-10-CM | POA: Diagnosis not present

## 2018-05-27 DIAGNOSIS — Z800 Family history of malignant neoplasm of digestive organs: Secondary | ICD-10-CM | POA: Diagnosis not present

## 2018-05-27 DIAGNOSIS — R1032 Left lower quadrant pain: Secondary | ICD-10-CM

## 2018-05-27 NOTE — Progress Notes (Signed)
Subjective:    Patient ID: Daniel Shepherd, male    DOB: Nov 08, 1995, 22 y.o.   MRN: [id number]  HPI  Pt in for follow up.  Pt states recently told that one of her family members grandad sister had colon cancer. She had genetic testing and great uncle also tested positive for gene but does not have cancer. Pt states oncologist told all family members to get checked. Pt grandad/sibling already passed away. But he did not have colon cancer.  Pt understand [facility] might have free genetic testing program.   Pt dad is 66 yo so he never had colonoscopy. Pt mom 40 yo but no colonoscopy.  Pt also state he was wrestling with someone and his head hit hard against mat and felt dizzy for 5 minutes. No loc at time of injury. He had ha the following week.  Accident happened on 05/10/2018.   The next day after probable first concussion he went to kick boxing and he got  Punched in face. Felt dizzy stunned. Then Lamonte Sakai came back after practice. Since then he has on and off mild ha daily. He has taken a break from sports activity.  Left upper inguinal pain intermittent since Sunday. Not severe. Hx of orchioectomy. Scheduled to do imaging study on Jun 14, 2018.     Review of Systems  Constitutional: Negative for chills, fatigue and fever.  Respiratory: Negative for cough, chest tightness and wheezing.   Cardiovascular: Negative for chest pain and palpitations.  Gastrointestinal: Negative for abdominal pain, nausea and vomiting.  Genitourinary: Negative for decreased urine volume, difficulty urinating, dysuria, enuresis, frequency, penile pain and testicular pain.       Left inguinal region pain.  Musculoskeletal: Negative for back pain and neck pain.  Neurological: Negative for dizziness, speech difficulty, weakness and headaches.       Pt most recent ha level 3/10 today. But not now.   Hematological: Negative for adenopathy. Does not bruise/bleed easily.  Psychiatric/Behavioral: Negative for behavioral  problems, confusion, self-injury and suicidal ideas. The patient is not nervous/anxious.    Past Medical History:  Diagnosis Date  . Hypertension    states recent high blood pressure during the last few weeks  . 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 Non-seminomatous testicular cancer, left ([facility]) 07/07/2017  . Scrotum pain   . Testicular cancer [facility])      Social History   Socioeconomic History  . Marital status: Married    Spouse name: Not on file  . Number of children: Not on file  . Years of education: Not on file  . Highest education level: Not on file  Occupational History  . Not on file  Social Needs  . Financial resource strain: Not on file  . Food insecurity:    Worry: Not on file    Inability: Not on file  . Transportation needs:    Medical: Not on file    Non-medical: Not on file  Tobacco Use  . Smoking status: Never Smoker  . Smokeless tobacco: Never Used  Substance and Sexual Activity  . Alcohol use: Yes    Comment: seldom  . Drug use: Yes    Types: Marijuana  . Sexual activity: Yes  Lifestyle  . Physical activity:    Days per week: Not on file    Minutes per session: Not on file  . Stress: Not on file  Relationships  . Social connections:    Talks on phone:  Not on file    Gets together: Not on file    Attends religious service: Not on file    Active member of club or organization: Not on file    Attends meetings of clubs or organizations: Not on file    Relationship status: Not on file  . Intimate partner violence:    Fear of current or ex partner: Not on file    Emotionally abused: Not on file    Physically abused: Not on file    Forced sexual activity: Not on file  Other Topics Concern  . Not on file  Social History Narrative  . Not on file    Past Surgical History:  Procedure Laterality Date  . HYDROCELE EXCISION Left 07/03/2017   Procedure: HYDROCELECTOMY ADULT/ INGUINAL APPROACH/  TESTICULAR BIOPSY/ ORCHIECTOMY;  Surgeon: Kathie Rhodes, MD;  Location: [facility];  Service: Urology;  Laterality: Left;  . IR FLUORO GUIDE PORT INSERTION RIGHT  07/09/2017  . IR REMOVAL TUN ACCESS W/ PORT W/O FL MOD SED  10/12/2017  . IR US GUIDE VASC ACCESS RIGHT  07/09/2017  . NO PAST SURGERIES    . ORCHIECTOMY      Family History  Problem Relation Age of Onset  . Healthy Mother   . Healthy Father     No Known Allergies  No current outpatient medications on file prior to visit.   Current Facility-Administered Medications on File Prior to Visit  Medication Dose Route Frequency Provider Last Rate Last Dose  . pegfilgrastim (NEULASTA ONPRO KIT) injection 6 mg  6 mg Subcutaneous Once [place], Sarah M, NP        BP (!) 137/7   Pulse 71   Temp 98.8 F (37.1 C) (Oral)   Resp 16   Ht 6' 1"  (1.854 m)   Wt 220 lb (99.8 kg)   SpO2 100%   BMI 29.03 kg/m       Objective:   Physical Exam  General Mental Status- Alert. General Appearance- Not in acute distress.   Skin General: Color- Normal Color. Moisture- Normal Moisture.  Neck Carotid Arteries- Normal color. Moisture- Normal Moisture. No carotid bruits. No JVD.  Chest and Lung Exam Auscultation: Breath Sounds:-Normal.  Cardiovascular Auscultation:Rythm- Regular. Murmurs & Other Heart Sounds:Auscultation of the heart reveals- No Murmurs.  Abdomen Inspection:-Inspeection Normal. Palpation/Percussion:Note:No mass. Palpation and Percussion of the abdomen reveal- Non Tender, Non Distended + BS, no rebound or guarding.    Neurologic Cranial Nerve exam:- CN III-XII intact(No nystagmus), symmetric smile. Drift Test:- No drift. Romberg Exam:- Negative.  Heal to Toe Gait exam:-Normal. Finger to Nose:- Normal/Intact Strength:- 5/5 equal and symmetric strength both upper and lower extremities  Genital exam-inguinal canals are free from herniation.  No masses felt.  No abnormal lymph nodes felt.       Assessment & Plan:  You do appear to have had possible 2 concussions since May 10, 2018.  I want you to not work or do any sports presently.  I did write you a work note until this coming Tuesday.  If your headache resolves completely by then then you could return back to work.  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  I think is probably best for you to wait another week and a half or so before returning to grappling/martial arts.  Prefer to have viewed 10 days asymptomatic post headache before returning.  Rest your brain over the weekend.  If you have  any residual headache by Monday then probably will go ahead and do CT of your head.  If any neurologic signs and symptoms over the weekend/changes then recommend ED evaluation.  Regarding your family history of colon cancer in the special gene testing, I would asked that you get information on that particular type of cancer and the gene name.  That way we could get oncologist opinion and get you tested for that if necessary.  Regarding recent left upper inguinal canal region pain, your exam felt negative today but passed a message to your oncologist and see if he wants to move up your imaging study at the end of December.  Follow-up with me in 2 weeks or as needed.  40 minutes spent with patient today.  50% of time spent counseling patient regarding his diagnosis today and his concerns.  Particularly his concern about colon cancer history in his family and is concerned about left groin region pain in light of his history of testicle cancer.

## 2018-05-27 NOTE — Patient Instructions (Signed)
You do appear to have had possible 2 concussions since May 10, 2018.  I want you to not work or do any sports presently.  I did write you a work note until this coming Tuesday.  If your headache resolves completely by then then you could return back to work.  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  I think is probably best for you to wait another week and a half or so before returning to grappling/martial arts.  Prefer to have viewed 10 days asymptomatic post headache before returning.  Rest your brain over the weekend.  If you have any residual headache by Monday then probably will go ahead and do CT of your head.  If any neurologic signs and symptoms over the weekend/changes then recommend ED evaluation.  Regarding your family history of colon cancer in the special gene testing, I would asked that you get information on that particular type of cancer and the gene name.  That way we could get oncologist opinion and get you tested for that if necessary.  Regarding recent left upper inguinal canal region pain, your exam felt negative today but passed a message to your oncologist and see if he wants to move up your imaging study at the end of December.  Follow-up with me in 2 weeks or as needed.

## 2018-06-14 ENCOUNTER — Inpatient Hospital Stay: Payer: Managed Care, Other (non HMO)

## 2018-06-14 ENCOUNTER — Ambulatory Visit (HOSPITAL_BASED_OUTPATIENT_CLINIC_OR_DEPARTMENT_OTHER)
Admission: RE | Admit: 2018-06-14 | Discharge: 2018-06-14 | Disposition: A | Discharge status: Home / Self Care | Payer: Managed Care, Other (non HMO) | Source: Ambulatory Visit | Attending: Hematology & Oncology | Admitting: Hematology & Oncology

## 2018-06-14 ENCOUNTER — Encounter: Payer: Self-pay | Admitting: Hematology & Oncology

## 2018-06-14 ENCOUNTER — Other Ambulatory Visit: Payer: Self-pay

## 2018-06-14 ENCOUNTER — Inpatient Hospital Stay: Payer: Managed Care, Other (non HMO) | Attending: Hematology & Oncology | Admitting: Hematology & Oncology

## 2018-06-14 VITALS — BP 148/77 | HR 84 | Temp 98.5°F | Resp 16 | Wt 222.0 lb

## 2018-06-14 DIAGNOSIS — C6292 Malignant neoplasm of left testis, unspecified whether descended or undescended: Secondary | ICD-10-CM | POA: Diagnosis present

## 2018-06-14 DIAGNOSIS — C801 Malignant (primary) neoplasm, unspecified: Principal | ICD-10-CM

## 2018-06-14 DIAGNOSIS — C7802 Secondary malignant neoplasm of left lung: Secondary | ICD-10-CM

## 2018-06-14 DIAGNOSIS — C7800 Secondary malignant neoplasm of unspecified lung: Secondary | ICD-10-CM | POA: Insufficient documentation

## 2018-06-14 LAB — CBC WITH DIFFERENTIAL (CANCER CENTER ONLY)
ABS IMMATURE GRANULOCYTES: 0.02 10*3/uL (ref 0.00–0.07)
BASOS ABS: 0 10*3/uL (ref 0.0–0.1)
BASOS PCT: 1 %
Eosinophils Absolute: 0.1 10*3/uL (ref 0.0–0.5)
Eosinophils Relative: 3 %
HCT: 44.1 % (ref 39.0–52.0)
Hemoglobin: 14.9 g/dL (ref 13.0–17.0)
IMMATURE GRANULOCYTES: 0 %
Lymphocytes Relative: 35 %
Lymphs Abs: 1.6 10*3/uL (ref 0.7–4.0)
MCH: 31 pg (ref 26.0–34.0)
MCHC: 33.8 g/dL (ref 30.0–36.0)
MCV: 91.9 fL (ref 80.0–100.0)
MONOS PCT: 7 %
Monocytes Absolute: 0.3 10*3/uL (ref 0.1–1.0)
NEUTROS ABS: 2.4 10*3/uL (ref 1.7–7.7)
NEUTROS PCT: 54 %
NRBC: 0 % (ref 0.0–0.2)
Platelet Count: 216 10*3/uL (ref 150–400)
RBC: 4.8 MIL/uL (ref 4.22–5.81)
RDW: 10.8 % — AB (ref 11.5–15.5)
WBC Count: 4.5 10*3/uL (ref 4.0–10.5)

## 2018-06-14 LAB — CMP (CANCER CENTER ONLY)
ALT: 26 U/L (ref 0–44)
AST: 23 U/L (ref 15–41)
Albumin: 4.8 g/dL (ref 3.5–5.0)
Alkaline Phosphatase: 77 U/L (ref 38–126)
Anion gap: 6 (ref 5–15)
BUN: 15 mg/dL (ref 6–20)
CALCIUM: 9.5 mg/dL (ref 8.9–10.3)
CO2: 29 mmol/L (ref 22–32)
Chloride: 102 mmol/L (ref 98–111)
Creatinine: 1.03 mg/dL (ref 0.61–1.24)
GFR, Est AFR Am: >60 mL/min (ref >60)
GFR, Estimated: >60 mL/min (ref >60)
Glucose, Bld: 105 mg/dL — ABNORMAL HIGH (ref 70–99)
Potassium: 4.9 mmol/L (ref 3.5–5.1)
Sodium: 137 mmol/L (ref 135–145)
Total Bilirubin: 0.5 mg/dL (ref 0.3–1.2)
Total Protein: 7.9 g/dL (ref 6.5–8.1)

## 2018-06-14 LAB — LACTATE DEHYDROGENASE: LDH: 195 U/L — ABNORMAL HIGH (ref 98–192)

## 2018-06-14 MED ORDER — IOPAMIDOL (ISOVUE-300) INJECTION 61%
100.0000 mL | Freq: Once | INTRAVENOUS | Status: AC | PRN
  Administered 2018-06-14: 100 mL via INTRAVENOUS

## 2018-06-14 NOTE — Progress Notes (Signed)
Hematology and Oncology Follow Up Visit  Daniel Shepherd [id number] Sep 11, 1995 22 y.o. 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  Everything is going quite well.  Had a wonderful Thanksgiving and Christmas with his family.  We did go ahead and do his CT scans.  There were done today.  The CT scan did not show any evidence of recurrent disease.  His last beta-hCG was less than 1.  He was wondering about a referral to genetics.  There is a family history of malignancy.  I do think this would be a bad idea.  I put the referral in.  He has gained weight.  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  He has had no headache.  There is been no visual issues.  He has had no change in bowel or bladder habits.  On occasion, he has some pain in the left groin area.  I told his this is on usual given that this is where he had his orchiectomy.  He probably has scar tissue and probably some nerve irritation.    Overall, his performance status is ECOG 0.  Medications: No current outpatient medications on file. No current facility-administered medications for this visit.   Facility-Administered Medications Ordered in Other Visits:  .  pegfilgrastim (NEULASTA ONPRO KIT) injection 6 mg, 6 mg, Subcutaneous, Once, [place], Holli Humbles, NP  Allergies: No Known Allergies  Past Medical History, Surgical history, Social history, and Family History were reviewed and updated.  Review of Systems: Review of Systems  Constitutional: Positive for fatigue.  HENT:  Negative.   Eyes: Negative.   Respiratory: Positive for shortness of breath.   Cardiovascular: Negative.   Gastrointestinal: Positive for nausea.  Endocrine: Negative.   Genitourinary: Negative.    Musculoskeletal: Negative.     Skin: Negative.   Neurological: Negative.   Hematological: Negative.   Psychiatric/Behavioral: Negative.     Physical Exam:  weight is 222 lb (100.7 kg). His oral temperature is 98.5 F (36.9 C). His blood pressure is 148/77 (abnormal) and his pulse is 84. His respiration is 16 and oxygen saturation is 99%.   Wt Readings from Last 3 Encounters:  06/14/18 222 lb (100.7 kg)  05/27/18 220 lb (99.8 kg)  04/06/18 212 lb 12.8 oz (96.5 kg)    Physical Exam Vitals signs reviewed.  HENT:     Head: Normocephalic and atraumatic.  Eyes:     Pupils: Pupils are equal, round, and reactive to light.  Neck:     Musculoskeletal: Normal range of motion.  Cardiovascular:     Rate and Rhythm: Normal rate and regular rhythm.     Heart sounds: Normal heart sounds.  Pulmonary:     Effort: Pulmonary effort is normal.     Breath sounds: Normal breath sounds.  Abdominal:     General: Bowel sounds are normal.     Palpations: Abdomen is soft.  Musculoskeletal: Normal range of motion.        General: No tenderness or deformity.  Lymphadenopathy:     Cervical: No cervical adenopathy.  Skin:    General: Skin is warm and dry.     Findings: No erythema or rash.  Neurological:     Mental Status: He is alert and oriented to person, place,  and time.  Psychiatric:        Behavior: Behavior normal.        Thought Content: Thought content normal.        Judgment: Judgment normal.      Lab Results  Component Value Date   WBC 4.5 06/14/2018   HGB 14.9 06/14/2018   HCT 44.1 06/14/2018   MCV 91.9 06/14/2018   PLT 216 06/14/2018     Chemistry      Component Value Date/Time   NA 137 06/14/2018 0842   K 4.9 06/14/2018 0842   CL 102 06/14/2018 0842   CO2 29 06/14/2018 0842   BUN 15 06/14/2018 0842   CREATININE 1.03 06/14/2018 0842      Component Value Date/Time   CALCIUM 9.5 06/14/2018 0842   ALKPHOS 77 06/14/2018 0842   AST 23 06/14/2018 0842   ALT 26 06/14/2018 0842   BILITOT 0.5  06/14/2018 0842      Impression and Plan: Daniel Shepherd is a 22 year old white male with metastatic embryonal cell carcinoma.  He had a left orchiectomy.  This was performed back on January 18.  A week later, he showed up in the emergency room with chest discomfort and some shortness of breath.  He had innumerable pulmonary nodules.  So far, everything is going quite well for him.  We will plan to get him back in 4 more months.  We will do a CT scan the same day that we see him.  I would think that his tumor markers would be a very good indication that something is coming back.  [name], MD 12/30/201910:46 AM

## 2018-06-15 ENCOUNTER — Telehealth: Payer: Self-pay | Admitting: *Deleted

## 2018-06-15 LAB — BETA HCG QUANT (REF LAB): hCG Quant: <1 m[IU]/mL (ref 0–3)

## 2018-06-15 LAB — AFP TUMOR MARKER: AFP, Serum, Tumor Marker: 3.9 ng/mL (ref 0.0–8.3)

## 2018-06-15 IMAGING — CT CT ABD-PELV W/ CM
2 of 5 series · 14 of 46 positions shown, 16 images · IV contrast (APPLIED)
Comparison: 08/31/2017.

CLINICAL DATA: Testicular cancer

EXAM:
CT CHEST, ABDOMEN, AND PELVIS WITH CONTRAST
TECHNIQUE: Multidetector CT imaging of the chest, abdomen and pelvis was
performed following the standard protocol during bolus
administration of intravenous contrast.
CONTRAST:  100mL [id number] IOPAMIDOL ([id number]) INJECTION 61%

[Series 2: cap with 2 · axial · 0.87mm/px · z∈[-773,-103]mm · 11 of 162 slices shown, 13 images]
[im 14/162  soft-tissue]
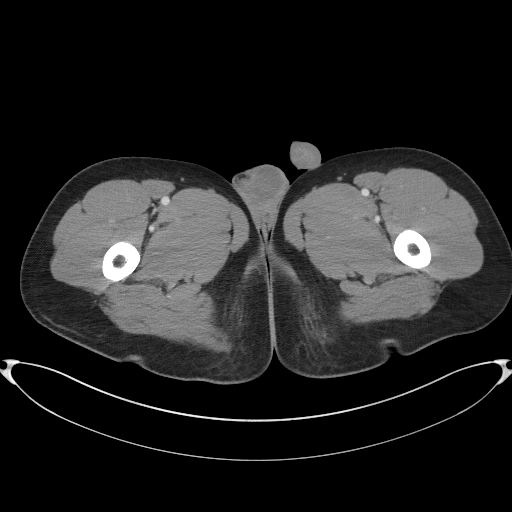
[im 14/162  bone]
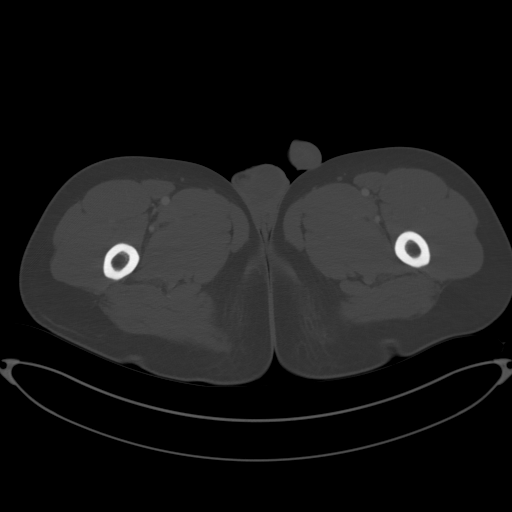
[im 27/162  soft-tissue]
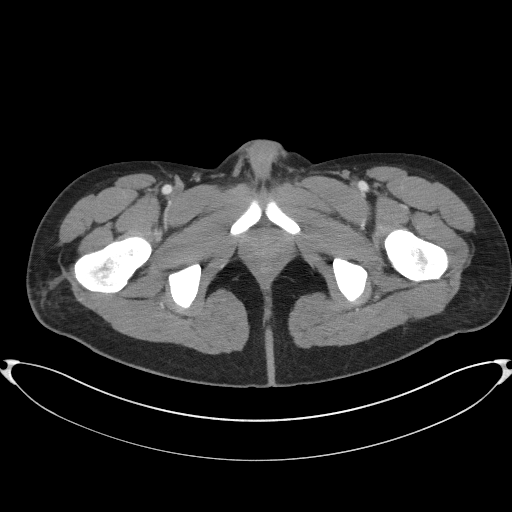
[im 41/162  soft-tissue]
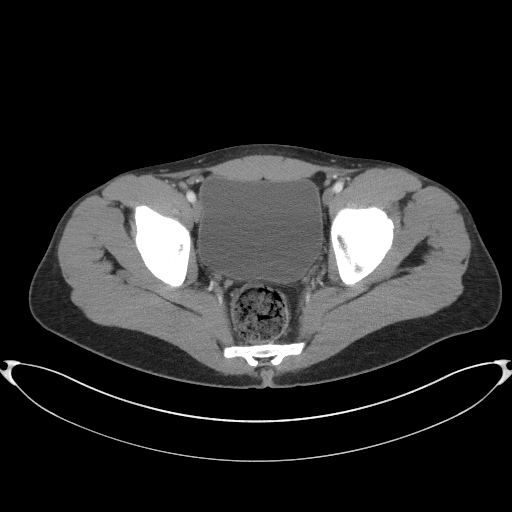
[im 54/162  soft-tissue]
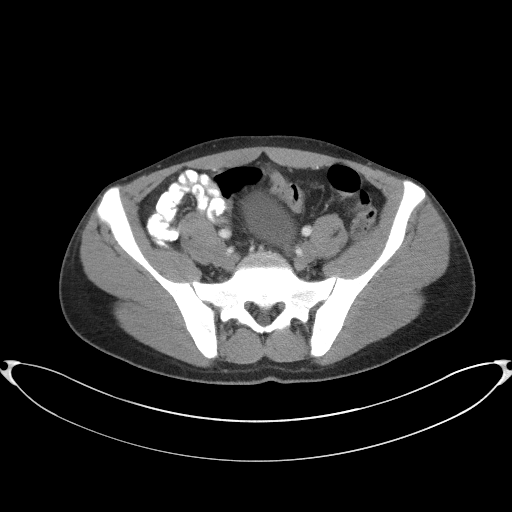
[im 68/162  soft-tissue]
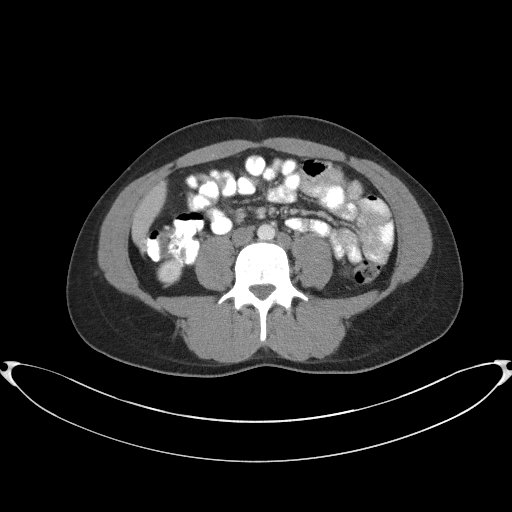
[im 81/162  soft-tissue]
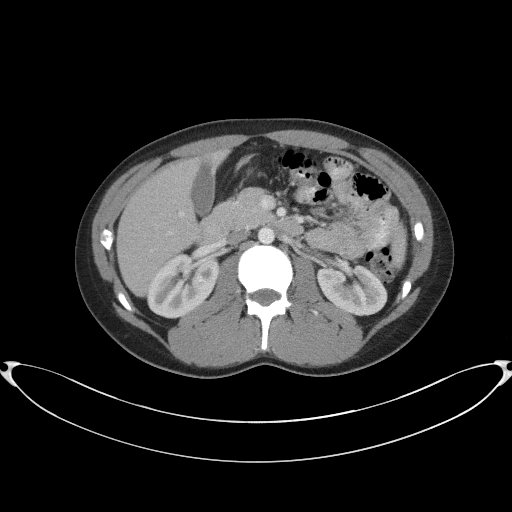
[im 94/162  soft-tissue]
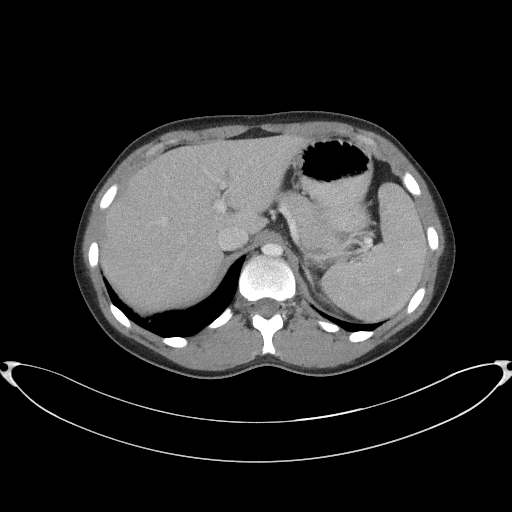
[im 108/162  soft-tissue]
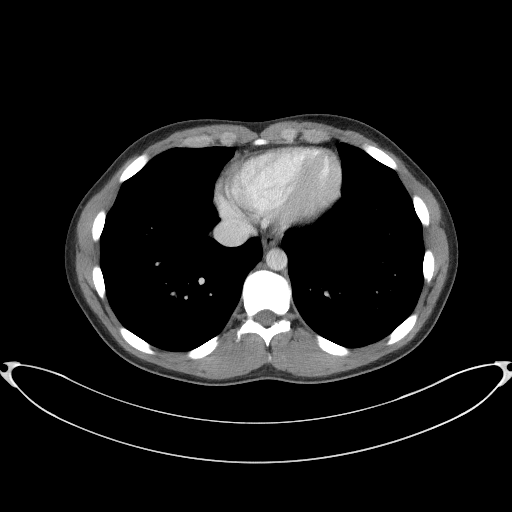
[im 121/162  soft-tissue]
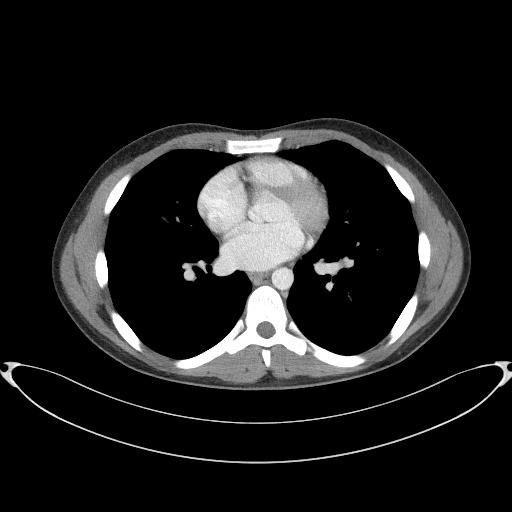
[im 121/162  bone]
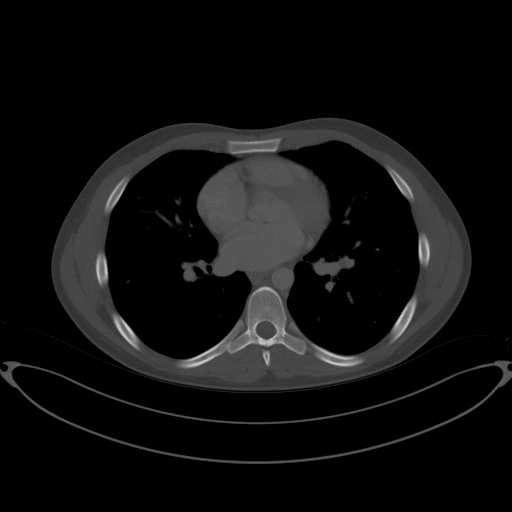
[im 135/162  soft-tissue]
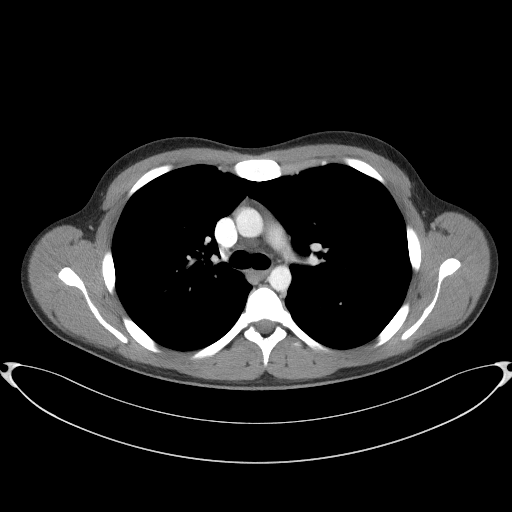
[im 148/162  soft-tissue]
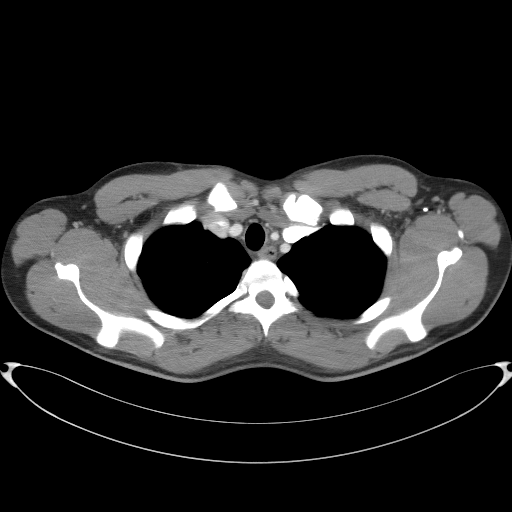

[Series 4: coronals · coronal · 0.87mm/px · 3 of 126 slices shown]
[im 42/126  soft-tissue]
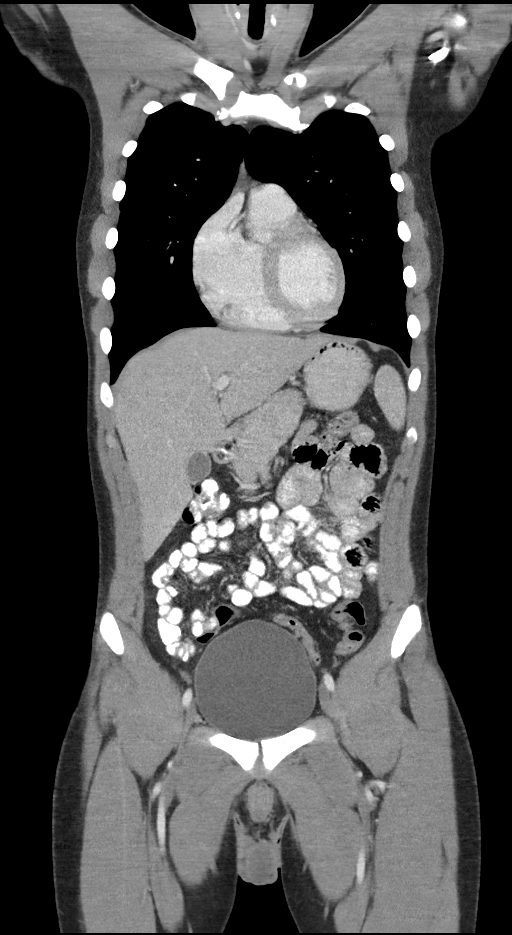
[im 56/126  soft-tissue]
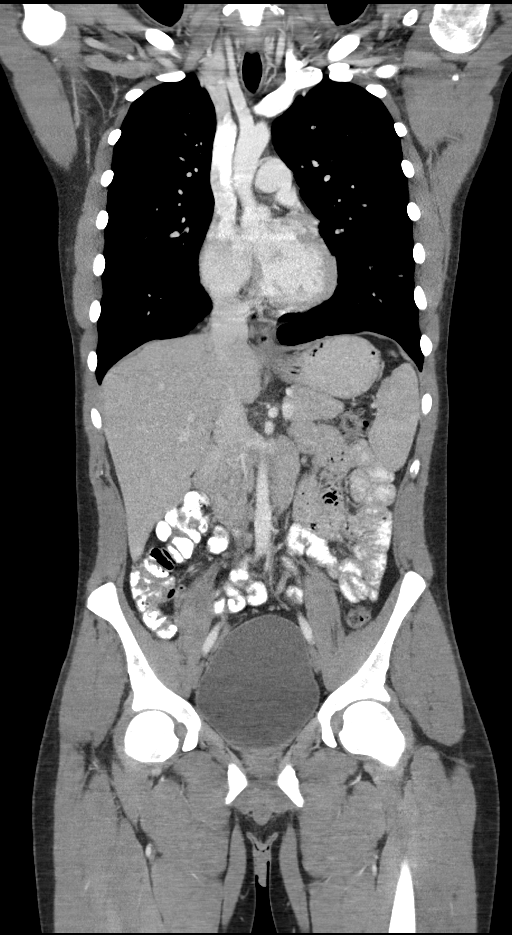
[im 70/126  soft-tissue]
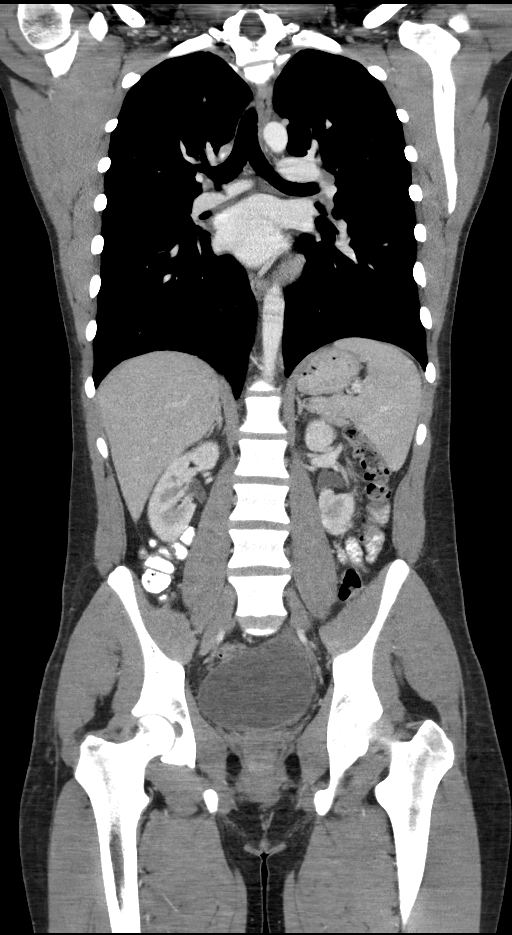

[14 of 46 positions shown; findings below may reference images not displayed]

FINDINGS: CT CHEST FINDINGS

Cardiovascular: Heart is normal in size.  No pericardial effusion.

No evidence of thoracic aortic aneurysm.

Mediastinum/Nodes: No suspicious mediastinal, hilar, or axillary
lymphadenopathy.

Visualized thyroid is unremarkable.

Lungs/Pleura: Improving pulmonary metastases, including:

--11 x 5 mm left lower lobe nodule (series 6/image 20), previously
12 x 7 mm

--6 x 4 mm right lower lobe nodule (series 6/image 153), previously
9 x 6 mm

--6 mm left basilar nodule (series 6/image 26), previously 7 mm

--7 x 10 mm right basilar nodule (series 6/image 172), previously 9
x 12 mm

No focal consolidation.

No pleural effusion or pneumothorax.

Musculoskeletal: Visualized osseous structures are within normal
limits.

CT ABDOMEN PELVIS FINDINGS

Hepatobiliary: Liver is within normal limits, noting a 5 mm cyst in
the hepatic dome (series 2/image 52).

Gallbladder is unremarkable. No intrahepatic or extrahepatic ductal
dilatation.

Pancreas: Within normal limits.

Spleen: Within normal limits.

Adrenals/Urinary Tract: Adrenal glands within normal limits.

Kidneys are within normal limits.  No hydronephrosis.

Bladder is within normal limits.

Stomach/Bowel: Stomach is within normal limits.

No evidence of bowel obstruction.

Normal appendix (series 2/image 103).

Vascular/Lymphatic: No evidence of abdominal aortic aneurysm.

Duplicated left IVC.

No suspicious abdominopelvic lymphadenopathy.

Reproductive: Prostate is unremarkable.

Postsurgical changes related to left orchiectomy.

Other: No abdominopelvic ascites.

Musculoskeletal: Visualized osseous structures are within normal
limits.
IMPRESSION: Status post left orchiectomy.

Improving pulmonary metastases, as described above.

No evidence of new/progressive metastatic disease.

## 2018-06-15 NOTE — Telephone Encounter (Addendum)
Patient is aware of results  ----- Message from [name], MD sent at 06/15/2018  9:40 AM EST ----- Call - the tumor marker is still less than 1!!!  This says it all -- NO cancer!!  Happy New Year!!  Laurey Arrow

## 2018-06-17 ENCOUNTER — Telehealth: Payer: Self-pay | Admitting: Hematology & Oncology

## 2018-06-17 NOTE — Telephone Encounter (Signed)
Called and spoke with patient regarding Genetics Appointment date/time/location was provided per referral

## 2018-06-24 ENCOUNTER — Inpatient Hospital Stay: Payer: Managed Care, Other (non HMO)

## 2018-06-24 ENCOUNTER — Inpatient Hospital Stay: Payer: Managed Care, Other (non HMO) | Attending: Hematology & Oncology | Admitting: Licensed Clinical Social Worker

## 2018-06-24 ENCOUNTER — Encounter: Payer: Self-pay | Admitting: Licensed Clinical Social Worker

## 2018-06-24 DIAGNOSIS — Z803 Family history of malignant neoplasm of breast: Secondary | ICD-10-CM | POA: Diagnosis not present

## 2018-06-24 DIAGNOSIS — Z800 Family history of malignant neoplasm of digestive organs: Secondary | ICD-10-CM | POA: Diagnosis not present

## 2018-06-24 NOTE — Progress Notes (Signed)
REFERRING PROVIDER: [name], MD [address] [place], [place] [address]  PRIMARY PROVIDER:  Mackie Pai, PA-C  PRIMARY REASON FOR VISIT:  1. Family history of Lynch syndrome   2. Family history of colon cancer   3. Family history of breast cancer      HISTORY OF PRESENT ILLNESS:   Mr. Fullen, a 23 y.o. male, was seen for a [place] cancer genetics consultation at the request of Dr. Marin Olp due to a personal and family history of cancer.  Mr. Passero presents to clinic today to discuss the possibility of a hereditary predisposition to cancer, genetic testing, and to further clarify his future cancer risks, as well as potential cancer risks for family members.   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 This was treated with surgery and chemotherapy.   Past Medical History:  Diagnosis Date  . Family history of breast cancer   . Family history of colon cancer   . Family history of Lynch syndrome   . Hypertension    states recent high blood pressure during the last few weeks  . 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 Non-seminomatous testicular cancer, left ([place]) 07/07/2017  . Scrotum pain   . Testicular cancer [facility])     Past Surgical History:  Procedure Laterality Date  . HYDROCELE EXCISION Left 07/03/2017   Procedure: HYDROCELECTOMY ADULT/ INGUINAL APPROACH/ TESTICULAR BIOPSY/ ORCHIECTOMY;  Surgeon: Kathie Rhodes, MD;  Location: [facility];  Service: Urology;  Laterality: Left;  . IR FLUORO GUIDE PORT INSERTION RIGHT  07/09/2017  . IR REMOVAL TUN ACCESS W/ PORT W/O FL MOD SED  10/12/2017  . IR US GUIDE VASC ACCESS RIGHT  07/09/2017  . NO PAST SURGERIES    . ORCHIECTOMY      Social History   Socioeconomic History  . Marital status: Married    Spouse name: Not on file  . Number of children: Not on file  . Years of  education: Not on file  . Highest education level: Not on file  Occupational History  . Not on file  Social Needs  . Financial resource strain: Not on file  . Food insecurity:    Worry: Not on file    Inability: Not on file  . Transportation needs:    Medical: Not on file    Non-medical: Not on file  Tobacco Use  . Smoking status: Never Smoker  . Smokeless tobacco: Never Used  Substance and Sexual Activity  . Alcohol use: Yes    Comment: seldom  . Drug use: Yes    Types: Marijuana  . Sexual activity: Yes  Lifestyle  . Physical activity:    Days per week: Not on file    Minutes per session: Not on file  . Stress: Not on file  Relationships  . Social connections:    Talks on phone: Not on file    Gets together: Not on file    Attends religious service: Not on file    Active member of club or organization: Not on file    Attends meetings of clubs or organizations: Not on file    Relationship status: Not on file  Other Topics Concern  . Not on file  Social History Narrative  . Not on file     FAMILY HISTORY:  We obtained a detailed, 4-generation family history.  Significant diagnoses  are listed below: Family History  Problem Relation Age of Onset  . Healthy Mother   . Healthy Father   . Colon cancer Other        Lynch syndrome  . Breast cancer Maternal Great-grandmother     Mr. Bellew has one sister, 3. He had one brother who died when he was 56, not cancer related.   Mr. Kraai mother is alive at 26. She has one brother in his 73s-40s who is alive and well. Mr. Suto maternal grandmother is living at 73 with no cancers. This grandmother's mother had breast cancer in her 82s. Mr. Carneiro does not have information about his maternal grandfather.   Mr. Nylund father is alive at 74. He had a brother that died at age 36 and a sister that died at age 94, not cancer related. Mr. Lubeck paternal grandmother is living at 76. His paternal grandfather died at 67. This  grandfather has a brother and a sister, the patient's great aunt and great uncle, who tested positive for Lynch syndrome recently, although reports were not available for review at the time of the session. The patient's great aunt is also going through cancer right now, he believes it is colon cancer. The patient also reports a paternal second cousin who currently has cancer, unknown type.   Mr. Baby is aware of previous family history of genetic testing for hereditary cancer risks. Patient's maternal ancestors are of German/Italian descent, and paternal ancestors are of German/Irish descent. There is no reported Ashkenazi Jewish ancestry. There is no known consanguinity.  GENETIC COUNSELING ASSESSMENT: Alexande Sheerin is a 23 y.o. male with a personal history of testicular cancer and family history of Lynch syndrome. We, therefore, discussed and recommended the following at today's visit.   DISCUSSION: Lynch syndrome increases the risk for colon, uterine, ovarian and stomach cancers, brain cancers, as well as others.  Families with Lynch Syndrome tend to have multiple family members with these cancers, typically diagnosed under age 57, and diagnoses in multiple generations. The genes that are known to cause Lynch Syndrome are called MLH1, MSH2, MSH6, PMS2 and EPCAM.  We discussed NCCN management guidelines for Lynch syndrome as well. With regard to his testicular cancer, we discussed that the causes are not well understood and it is generally something that is thought to be sporadic, however it has been seen in some syndromes such as Li-Fraumeni and [place].   We reviewed the characteristics, features and inheritance patterns of hereditary cancer syndromes. We also discussed genetic testing, including the appropriate family members to test, the process of testing, insurance coverage and turn-around-time for results. We discussed the implications of a negative, positive and/or variant of uncertain significant  result. We recommended Mr. Mory pursue genetic testing for the [facility] Multi-Cancer Panel.  The Multi-Cancer Panel offered by [facility] includes sequencing and/or deletion duplication testing of the following 84 genes: AIP, ALK, APC, ATM, AXIN2,BAP1,  BARD1, BLM, BMPR1A, BRCA1, BRCA2, BRIP1, CASR, CDC73, CDH1, CDK4, CDKN1B, CDKN1C, CDKN2A (p14ARF), CDKN2A (p16INK4a), CEBPA, CHEK2, CTNNA1, DICER1, DIS3L2, EGFR (c.2369C>T, p.Thr790Met variant only), EPCAM (Deletion/duplication testing only), FH, FLCN, GATA2, GPC3, GREM1 (Promoter region deletion/duplication testing only), HOXB13 (c.251G>A, p.Gly84Glu), HRAS, KIT, MAX, MEN1, MET, MITF (c.952G>A, p.Glu318Lys variant only), MLH1, MSH2, MSH3, MSH6, MUTYH, NBN, NF1, NF2, NTHL1, PALB2, PDGFRA, PHOX2B, PMS2, POLD1, POLE, POT1, PRKAR1A, PTCH1, PTEN, RAD50, RAD51C, RAD51D, RB1, RECQL4, RET, RUNX1, SDHAF2, SDHA (sequence changes only), SDHB, SDHC, SDHD, SMAD4, SMARCA4, SMARCB1, SMARCE1, STK11, SUFU, TERC, TERT, TMEM127, TP53, TSC1, TSC2,  VHL, WRN and WT1.   We discussed that if he is found to have a mutation in one of these genes, it may impact future medical management recommendations such as increased cancer screenings and consideration of risk reducing surgeries.  A positive result could also have implications for the patient's family members.  A Negative result would mean we were unable to identify a hereditary component to his cancer, but does not rule out the possibility of a hereditary basis for his cancer.  There could be mutations that are undetectable by current technology, or in genes not yet tested or identified to increase cancer risk.    We discussed the potential to find a Variant of Uncertain Significance or VUS.  These are variants that have not yet been identified as pathogenic or benign, and it is unknown if this variant is associated with increased cancer risk or if this is a normal finding.  Most VUS's are reclassified to benign or likely benign.    It should not be used to make medical management decisions. With time, we suspect the lab will determine the significance of any VUS's identified if any.   Based on Mr. Mellinger personal and family history of cancer and Lynch syndrome, he meets NCCN medical criteria for genetic testing. Despite that he meets criteria, he may still have an out of pocket cost. The lab will notify him of his OOP if any.  PLAN: After considering the risks, benefits, and limitations, Mr. Donaway  provided informed consent to pursue genetic testing and the blood sample was sent to [facility] for analysis of the Multi-Cancer Panel. Results should be available within approximately 2-3 weeks' time, at which point they will be disclosed by telephone to Mr. Ahart, as will any additional recommendations warranted by these results. Mr. Shetley will receive a summary of his genetic counseling visit and a copy of his results once available. This information will also be available in Epic.   Based on Mr. Tavano family history, we recommended his sister and paternal relatives have genetic counseling and testing. Mr. Guggenheim will let us know if we can be of any assistance in coordinating genetic counseling and/or testing for this family member.   Lastly, we encouraged Mr. Mccarley to remain in contact with cancer genetics annually so that we can continuously update the family history and inform him of any changes in cancer genetics and testing that may be of benefit for this family.   Mr.  Bollier questions were answered to his satisfaction today. Our contact information was provided should additional questions or concerns arise. Thank you for the referral and allowing [place] to share in the care of your patient.   Faith Rogue, MS Genetic Counselor [email or web address] Phone: [phone]  The patient was seen for a total of 40 minutes in face-to-face genetic counseling.

## 2018-06-30 ENCOUNTER — Ambulatory Visit: Payer: Managed Care, Other (non HMO) | Admitting: Cardiology

## 2018-07-08 ENCOUNTER — Ambulatory Visit: Payer: Self-pay | Admitting: Licensed Clinical Social Worker

## 2018-07-08 ENCOUNTER — Encounter: Payer: Self-pay | Admitting: Licensed Clinical Social Worker

## 2018-07-08 ENCOUNTER — Telehealth: Payer: Self-pay | Admitting: Licensed Clinical Social Worker

## 2018-07-08 DIAGNOSIS — Z1379 Encounter for other screening for genetic and chromosomal anomalies: Secondary | ICD-10-CM | POA: Insufficient documentation

## 2018-07-08 NOTE — Telephone Encounter (Signed)
Revealed negative genetic testing. This normal result is reassuring and indicates that it is unlikely Daniel Shepherd cancer is due to a hereditary cause.  It is unlikely that there is an increased risk of another cancer due to a mutation in one of these genes. However, genetic testing is not perfect, and cannot definitively rule out a hereditary cause.  It will be important for him to keep in contact with genetics to learn if any additional testing may be needed in the future. Given his reported family history of Lynch syndrome, his paternal family members could benefit from genetic counseling and testing.

## 2018-07-08 NOTE — Progress Notes (Signed)
HPI:  Daniel Shepherd was previously seen in the [facility] clinic on 06/24/2018 due to a personal history of testicular cancer, family history of Lynch syndrome, and concerns regarding a hereditary predisposition to cancer. Please refer to our prior cancer genetics clinic note for more information regarding Daniel Shepherd medical, social and family histories, and our assessment and recommendations, at the time. Daniel Shepherd recent genetic test results were disclosed to him, as well as recommendations warranted by these results. These results and recommendations are discussed in more detail below.   FAMILY HISTORY:  We obtained a detailed, 4-generation family history.  Significant diagnoses are listed below: Family History  Problem Relation Age of Onset  . Healthy Mother   . Healthy Father   . Colon cancer Other        Lynch syndrome  . Breast cancer Maternal Great-grandmother    Daniel Shepherd has one sister, 61. He had one brother who died when he was 69, not cancer related.   Daniel Shepherd mother is alive at 8. She has one brother in his 7s-40s who is alive and well. Daniel Shepherd maternal grandmother is living at 30 with no cancers. This grandmother's mother had breast cancer in her 60s. Daniel Shepherd does not have information about his maternal grandfather.   Daniel Shepherd father is alive at 58. He had a brother that died at age 43 and a sister that died at age 38, not cancer related. Daniel Shepherd paternal grandmother is living at 60. His paternal grandfather died at 47. This grandfather has a brother and a sister, the patient's great aunt and great uncle, who tested positive for Lynch syndrome recently, although reports were not available for review at the time of the session. The patient's great aunt is also going through cancer right now, he believes it is colon cancer. The patient also reports a paternal second cousin who currently has cancer, unknown type.   Daniel Shepherd is aware of previous  family history of genetic testing for hereditary cancer risks. Patient's maternal ancestors are of German/Italian descent, and paternal ancestors are of German/Irish descent. There is no reported Ashkenazi Jewish ancestry. There is no known consanguinity.  GENETIC TEST RESULTS: Genetic testing performed through [facility]'s Multi-Cancer Panel reported out on 07/08/2018 showed no pathogenic mutations. The Multi-Cancer Panel offered by [facility] includes sequencing and/or deletion duplication testing of the following 84 genes: AIP, ALK, APC, ATM, AXIN2,BAP1,  BARD1, BLM, BMPR1A, BRCA1, BRCA2, BRIP1, CASR, CDC73, CDH1, CDK4, CDKN1B, CDKN1C, CDKN2A (p14ARF), CDKN2A (p16INK4a), CEBPA, CHEK2, CTNNA1, DICER1, DIS3L2, EGFR (c.2369C>T, p.Thr790Met variant only), EPCAM (Deletion/duplication testing only), FH, FLCN, GATA2, GPC3, GREM1 (Promoter region deletion/duplication testing only), HOXB13 (c.251G>A, p.Gly84Glu), HRAS, KIT, MAX, MEN1, MET, MITF (c.952G>A, p.Glu318Lys variant only), MLH1, MSH2, MSH3, MSH6, MUTYH, NBN, NF1, NF2, NTHL1, PALB2, PDGFRA, PHOX2B, PMS2, POLD1, POLE, POT1, PRKAR1A, PTCH1, PTEN, RAD50, RAD51C, RAD51D, RB1, RECQL4, RET, RUNX1, SDHAF2, SDHA (sequence changes only), SDHB, SDHC, SDHD, SMAD4, SMARCA4, SMARCB1, SMARCE1, STK11, SUFU, TERC, TERT, TMEM127, TP53, TSC1, TSC2, VHL, WRN and WT1.  The test report will be scanned into EPIC and will be located under the Molecular Pathology section of the Results Review tab. A portion of the result report is included below for reference.    We discussed with Daniel Shepherd that because current genetic testing is not perfect, it is possible there may be a gene mutation in one of these genes that current testing cannot detect, but that chance is small.  We also discussed, that there  could be another gene that has not yet been discovered, or that we have not yet tested, that is responsible for the cancer diagnoses in the family. It is also possible there is a hereditary  cause for the cancer in the family that Daniel Shepherd did not inherit and therefore was not identified in his testing.  Therefore, it is important to remain in touch with cancer genetics in the future so that we can continue to offer Daniel Shepherd the most up to date genetic testing.   ADDITIONAL GENETIC TESTING: We discussed with Daniel Shepherd that his genetic testing was fairly extensive.  If there are are genes identified to increase cancer risk that can be analyzed in the future, we would be happy to discuss and coordinate this testing at that time.    CANCER SCREENING RECOMMENDATIONS: Daniel Shepherd test result is considered negative (normal).  This means that we have not identified a hereditary cause for his personal and family history of cancer at this time.   This result indicates that it is unlikely Daniel Shepherd has an increased risk for a future cancer due to a mutation in one of these genes. This normal test also suggests that Daniel Shepherd cancer was most likely not due to an inherited predisposition associated with one of these genes.  Most cancers happen by chance and this negative test suggests that his cancer may fall into this category.   While reassuring, this does not definitively rule out a hereditary predisposition to cancer. It is still possible that there could be genetic mutations that are undetectable by current technology, or genetic mutations in genes that have not been tested or identified to increase cancer risk.  Therefore, it is recommended he continue to follow the cancer management and screening guidelines provided by his oncology and primary healthcare provider. An individual's cancer risk is not determined by genetic test results alone.  Overall cancer risk assessment includes additional factors such as personal medical history, family history, etc.  These should be used to make a personalized plan for cancer prevention and surveillance.    RECOMMENDATIONS FOR FAMILY MEMBERS:  Relatives in  this family might be at some increased risk of developing cancer, over the general population risk, simply due to the family history of cancer.  We recommended women in this family have a yearly mammogram beginning at age 68, or 74 years younger than the earliest onset of cancer, an annual clinical breast exam, and perform monthly breast self-exams. Women in this family should also have a gynecological exam as recommended by their primary provider. All family members should have a colonoscopy as directed by their doctors.  All family members should inform their physicians about the family history of cancer so their doctors can make the most appropriate screening recommendations for them.   It is also possible there is a hereditary cause for the cancer in Mr. Swamy family that he did not inherit and therefore was not identified in him.  We recommended his paternal relatives have genetic counseling and testing. Mr. Willems will let us know if we can be of any assistance in coordinating genetic counseling and/or testing for these family members.   FOLLOW-UP: Lastly, we discussed with Mr. Lewman that cancer genetics is a rapidly advancing field and it is possible that new genetic tests will be appropriate for him and/or his family members in the future. We encouraged him to remain in contact with cancer genetics on an annual basis so we can update his  personal and family histories and let him know of advances in cancer genetics that may benefit this family.   Our contact number was provided. Mr. Wuthrich questions were answered to his satisfaction, and he knows he is welcome to call us at anytime with additional questions or concerns.  Faith Rogue, MS Genetic Counselor [email or web address] Phone: [phone]

## 2018-07-22 ENCOUNTER — Ambulatory Visit: Payer: Managed Care, Other (non HMO) | Admitting: Medical

## 2018-10-11 ENCOUNTER — Inpatient Hospital Stay: Payer: Managed Care, Other (non HMO)

## 2018-10-11 ENCOUNTER — Ambulatory Visit (HOSPITAL_BASED_OUTPATIENT_CLINIC_OR_DEPARTMENT_OTHER): Payer: Managed Care, Other (non HMO)

## 2018-10-11 ENCOUNTER — Inpatient Hospital Stay: Payer: Managed Care, Other (non HMO) | Attending: Hematology & Oncology | Admitting: Hematology & Oncology

## 2018-10-11 ENCOUNTER — Ambulatory Visit (HOSPITAL_BASED_OUTPATIENT_CLINIC_OR_DEPARTMENT_OTHER): Admission: RE | Admit: 2018-10-11 | Payer: Managed Care, Other (non HMO) | Source: Ambulatory Visit

## 2018-10-15 ENCOUNTER — Telehealth: Payer: Self-pay | Admitting: Hematology & Oncology

## 2018-10-15 NOTE — Telephone Encounter (Signed)
Spoke with patients wife regarding CT appts and instructions 4/30/ He will go today to pick up contrast

## 2018-10-18 ENCOUNTER — Ambulatory Visit (HOSPITAL_BASED_OUTPATIENT_CLINIC_OR_DEPARTMENT_OTHER)
Admission: RE | Admit: 2018-10-18 | Discharge: 2018-10-18 | Disposition: A | Discharge status: Home / Self Care | Payer: Managed Care, Other (non HMO) | Source: Ambulatory Visit | Attending: Hematology & Oncology | Admitting: Hematology & Oncology

## 2018-10-18 ENCOUNTER — Telehealth: Payer: Self-pay | Admitting: *Deleted

## 2018-10-18 ENCOUNTER — Telehealth: Payer: Self-pay | Admitting: Hematology & Oncology

## 2018-10-18 ENCOUNTER — Encounter (HOSPITAL_BASED_OUTPATIENT_CLINIC_OR_DEPARTMENT_OTHER): Payer: Self-pay

## 2018-10-18 ENCOUNTER — Inpatient Hospital Stay: Payer: Managed Care, Other (non HMO) | Attending: Hematology & Oncology

## 2018-10-18 ENCOUNTER — Other Ambulatory Visit: Payer: Self-pay

## 2018-10-18 DIAGNOSIS — Z8547 Personal history of malignant neoplasm of testis: Secondary | ICD-10-CM | POA: Diagnosis present

## 2018-10-18 DIAGNOSIS — C6292 Malignant neoplasm of left testis, unspecified whether descended or undescended: Secondary | ICD-10-CM

## 2018-10-18 DIAGNOSIS — Z9079 Acquired absence of other genital organ(s): Secondary | ICD-10-CM | POA: Diagnosis not present

## 2018-10-18 LAB — CBC WITH DIFFERENTIAL (CANCER CENTER ONLY)
Abs Immature Granulocytes: 0.01 10*3/uL (ref 0.00–0.07)
Basophils Absolute: 0 10*3/uL (ref 0.0–0.1)
Basophils Relative: 1 %
Eosinophils Absolute: 0.1 10*3/uL (ref 0.0–0.5)
Eosinophils Relative: 2 %
HCT: 40.4 % (ref 39.0–52.0)
Hemoglobin: 13.7 g/dL (ref 13.0–17.0)
Immature Granulocytes: 0 %
Lymphocytes Relative: 41 %
Lymphs Abs: 1.8 10*3/uL (ref 0.7–4.0)
MCH: 31.1 pg (ref 26.0–34.0)
MCHC: 33.9 g/dL (ref 30.0–36.0)
MCV: 91.8 fL (ref 80.0–100.0)
Monocytes Absolute: 0.3 10*3/uL (ref 0.1–1.0)
Monocytes Relative: 7 %
Neutro Abs: 2.2 10*3/uL (ref 1.7–7.7)
Neutrophils Relative %: 49 %
Platelet Count: 204 10*3/uL (ref 150–400)
RBC: 4.4 MIL/uL (ref 4.22–5.81)
RDW: 11.5 % (ref 11.5–15.5)
WBC Count: 4.4 10*3/uL (ref 4.0–10.5)
nRBC: 0 % (ref 0.0–0.2)

## 2018-10-18 LAB — CMP (CANCER CENTER ONLY)
ALT: 13 U/L (ref 0–44)
AST: 15 U/L (ref 15–41)
Albumin: 4.4 g/dL (ref 3.5–5.0)
Alkaline Phosphatase: 69 U/L (ref 38–126)
Anion gap: 8 (ref 5–15)
BUN: 15 mg/dL (ref 6–20)
CO2: 26 mmol/L (ref 22–32)
Calcium: 9.5 mg/dL (ref 8.9–10.3)
Chloride: 102 mmol/L (ref 98–111)
Creatinine: 0.88 mg/dL (ref 0.61–1.24)
GFR, Est AFR Am: >60 mL/min (ref >60)
GFR, Estimated: >60 mL/min (ref >60)
Glucose, Bld: 97 mg/dL (ref 70–99)
Potassium: 4.3 mmol/L (ref 3.5–5.1)
Sodium: 136 mmol/L (ref 135–145)
Total Bilirubin: 0.6 mg/dL (ref 0.3–1.2)
Total Protein: 7.3 g/dL (ref 6.5–8.1)

## 2018-10-18 LAB — LACTATE DEHYDROGENASE: LDH: 172 U/L (ref 98–192)

## 2018-10-18 MED ORDER — IOHEXOL 300 MG/ML  SOLN
100.0000 mL | Freq: Once | INTRAMUSCULAR | Status: AC | PRN
  Administered 2018-10-18: 100 mL via INTRAVENOUS

## 2018-10-18 NOTE — Telephone Encounter (Signed)
Unable to reach pt, voicemail is not set up

## 2018-10-18 NOTE — Telephone Encounter (Signed)
-----   Message from [name], MD sent at 10/18/2018 11:26 AM EDT ----- Call - NO cancer coming back on the CT scan!!  Great job!!  Laurey Arrow

## 2018-10-18 NOTE — Telephone Encounter (Signed)
Pt voicemail not set up to inform og r/s md appt 5/7 at 1245 pm per 5/4 sch msg. Pt uses mychart

## 2018-10-19 ENCOUNTER — Encounter: Payer: Self-pay | Admitting: *Deleted

## 2018-10-19 LAB — BETA HCG QUANT (REF LAB): hCG Quant: <1 m[IU]/mL (ref 0–3)

## 2018-10-19 LAB — AFP TUMOR MARKER: AFP, Serum, Tumor Marker: 4.2 ng/mL (ref 0.0–8.3)

## 2018-10-21 ENCOUNTER — Other Ambulatory Visit: Payer: Self-pay

## 2018-10-21 ENCOUNTER — Inpatient Hospital Stay (HOSPITAL_BASED_OUTPATIENT_CLINIC_OR_DEPARTMENT_OTHER): Payer: Managed Care, Other (non HMO) | Admitting: Hematology & Oncology

## 2018-10-21 VITALS — BP 141/67 | HR 61 | Temp 98.1°F | Resp 18 | Wt 225.0 lb

## 2018-10-21 DIAGNOSIS — Z8547 Personal history of malignant neoplasm of testis: Secondary | ICD-10-CM

## 2018-10-21 DIAGNOSIS — Z9079 Acquired absence of other genital organ(s): Secondary | ICD-10-CM | POA: Diagnosis not present

## 2018-10-21 DIAGNOSIS — C6292 Malignant neoplasm of left testis, unspecified whether descended or undescended: Secondary | ICD-10-CM

## 2018-10-21 NOTE — Progress Notes (Signed)
Hematology and Oncology Follow Up Visit  Joshue Badal [id number] Oct 20, 1995 23 y.o. 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 Datta is  back for follow-up.  He is doing quite well.  He is actually building a new home for he and his wife and kids.  I am very impressed by this.  He looks great.  He feels good.  He really has no specific complaints.  We did go ahead and do a CT scan on him.  This was done on 10/18/2018.  The CT scan showed no evidence of metastatic disease or recurrent disease from his testicular cancer.  His alpha-fetoprotein was 4.2.  His beta-hCG was less than 1.  He has had no problems with cough.  He has had no fever.  He has had no rashes.  There is been no nausea or vomiting.  He has had no change in bowel or bladder habits.  Overall, his performance status is ECOG 0.    Medications: No current outpatient medications on file. No current facility-administered medications for this visit.   Facility-Administered Medications Ordered in Other Visits:  .  pegfilgrastim (NEULASTA ONPRO KIT) injection 6 mg, 6 mg, Subcutaneous, Once, [place], Holli Humbles, NP  Allergies: No Known Allergies  Past Medical History, Surgical history, Social history, and Family History were reviewed and updated.  Review of Systems: Review of Systems  Constitutional: Positive for fatigue.  HENT:  Negative.   Eyes: Negative.   Respiratory: Positive for shortness of breath.   Cardiovascular: Negative.   Gastrointestinal: Positive for nausea.  Endocrine: Negative.   Genitourinary: Negative.    Musculoskeletal: Negative.   Skin: Negative.   Neurological: Negative.   Hematological: Negative.   Psychiatric/Behavioral: Negative.     Physical Exam:  weight is 225 lb (102.1 kg). His oral temperature is 98.1  F (36.7 C). His blood pressure is 141/67 (abnormal) and his pulse is 61. His respiration is 18 and oxygen saturation is 100%.   Wt Readings from Last 3 Encounters:  10/21/18 225 lb (102.1 kg)  06/14/18 222 lb (100.7 kg)  05/27/18 220 lb (99.8 kg)    Physical Exam Vitals signs reviewed.  HENT:     Head: Normocephalic and atraumatic.  Eyes:     Pupils: Pupils are equal, round, and reactive to light.  Neck:     Musculoskeletal: Normal range of motion.  Cardiovascular:     Rate and Rhythm: Normal rate and regular rhythm.     Heart sounds: Normal heart sounds.  Pulmonary:     Effort: Pulmonary effort is normal.     Breath sounds: Normal breath sounds.  Abdominal:     General: Bowel sounds are normal.     Palpations: Abdomen is soft.  Musculoskeletal: Normal range of motion.        General: No tenderness or deformity.  Lymphadenopathy:     Cervical: No cervical adenopathy.  Skin:    General: Skin is warm and dry.     Findings: No erythema or rash.  Neurological:     Mental Status: He is alert and oriented to person, place, and time.  Psychiatric:        Behavior: Behavior normal.        Thought Content: Thought content normal.        Judgment: Judgment normal.  Lab Results  Component Value Date   WBC 4.4 10/18/2018   HGB 13.7 10/18/2018   HCT 40.4 10/18/2018   MCV 91.8 10/18/2018   PLT 204 10/18/2018     Chemistry      Component Value Date/Time   NA 136 10/18/2018 1035   K 4.3 10/18/2018 1035   CL 102 10/18/2018 1035   CO2 26 10/18/2018 1035   BUN 15 10/18/2018 1035   CREATININE 0.88 10/18/2018 1035      Component Value Date/Time   CALCIUM 9.5 10/18/2018 1035   ALKPHOS 69 10/18/2018 1035   AST 15 10/18/2018 1035   ALT 13 10/18/2018 1035   BILITOT 0.6 10/18/2018 1035      Impression and Plan: Mr. Barna is a 23 year old white male with metastatic embryonal cell carcinoma.  He had a left orchiectomy.  This was performed back on January 18.  A week  later, he showed up in the emergency room with chest discomfort and some shortness of breath.  He had innumerable pulmonary nodules.  I see no evidence of recurrent disease on him.  I really think that he is going to be cured.  I think we can now go to 6 months with follow-up.  He is now over a year out from treatment.  I feel good that 6 months would be reasonable.  I am just happy that his quality life is doing so well.  He really is amazing as to what he can do now.  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  A big fight is coming up this weekend on TV.   [name], MD 5/7/20201:26 PM

## 2018-11-17 ENCOUNTER — Other Ambulatory Visit: Payer: Managed Care, Other (non HMO)

## 2018-11-17 ENCOUNTER — Ambulatory Visit: Payer: Managed Care, Other (non HMO) | Admitting: Hematology & Oncology

## 2019-01-31 IMAGING — CT CT ABD-PELV W/ CM
2 of 5 series · 14 of 46 positions shown, 16 images · IV contrast (APPLIED)
Comparison: Prior studies 01/26/2018 and 10/27/2017.

CLINICAL DATA: M parietal carcinoma of the left testis status post
orchiectomy and chemotherapy for metastatic disease.

EXAM:
CT CHEST, ABDOMEN, AND PELVIS WITH CONTRAST
TECHNIQUE: Multidetector CT imaging of the chest, abdomen and pelvis was
performed following the standard protocol during bolus
administration of intravenous contrast.
CONTRAST:  100mL 63TRVX-EHH IOPAMIDOL (63TRVX-EHH) INJECTION 61%

[Series 3: cap with 2 · axial · 0.96mm/px · z∈[-798,-138]mm · 11 of 156 slices shown, 13 images]
[im 12/156  soft-tissue]
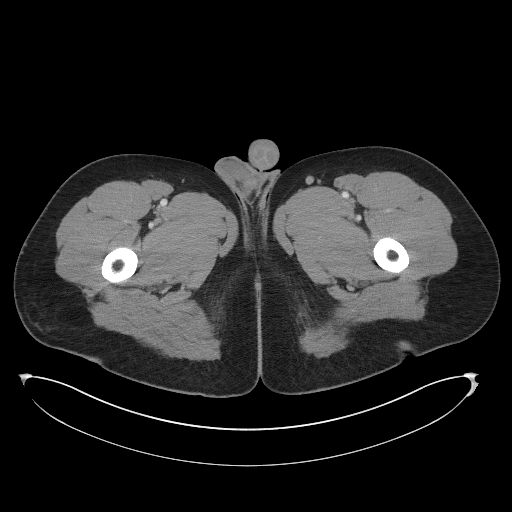
[im 12/156  bone]
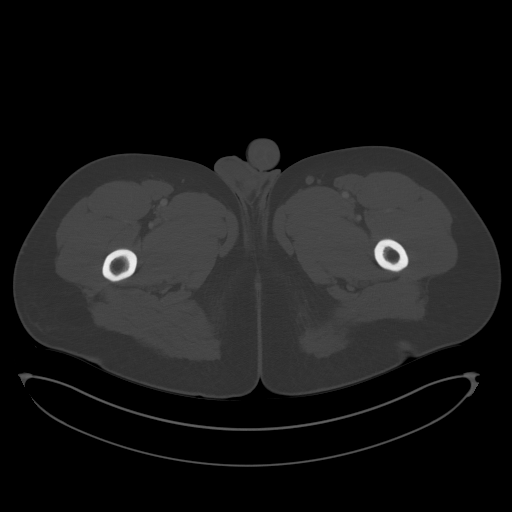
[im 24/156  soft-tissue]
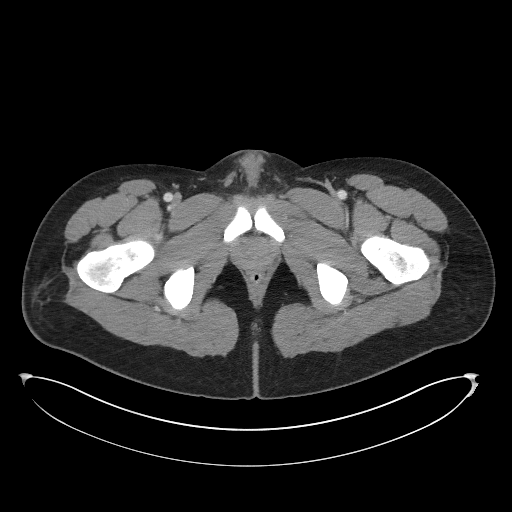
[im 36/156  soft-tissue]
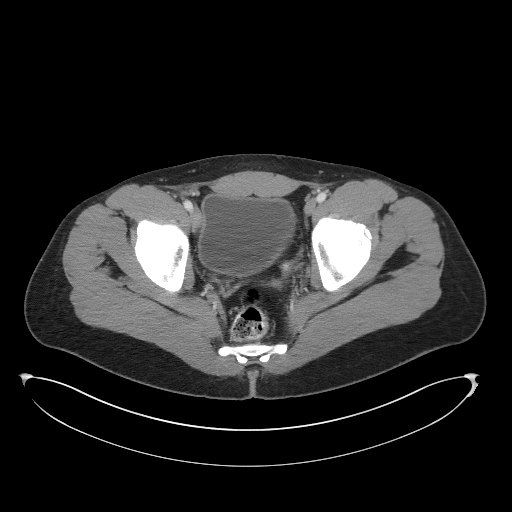
[im 48/156  soft-tissue]
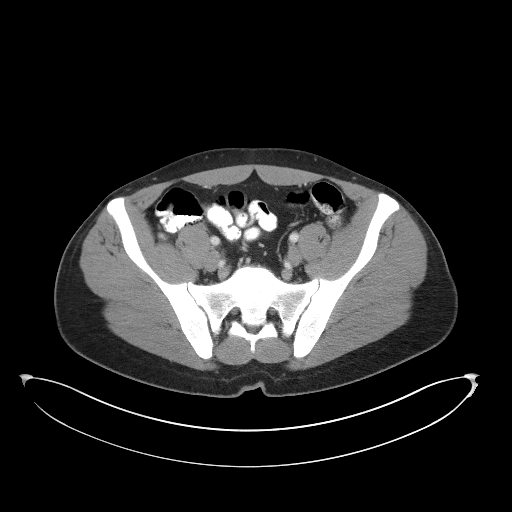
[im 60/156  soft-tissue]
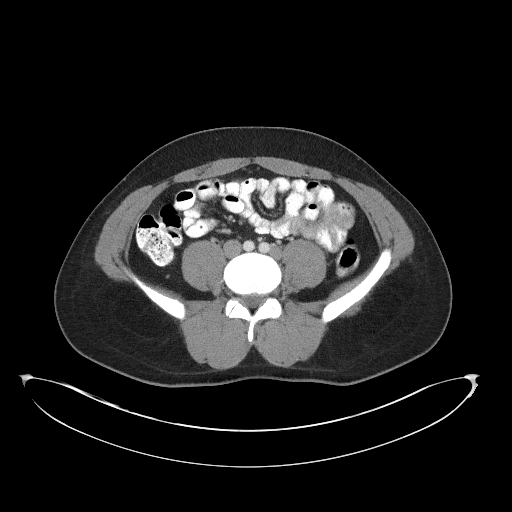
[im 84/156  soft-tissue]
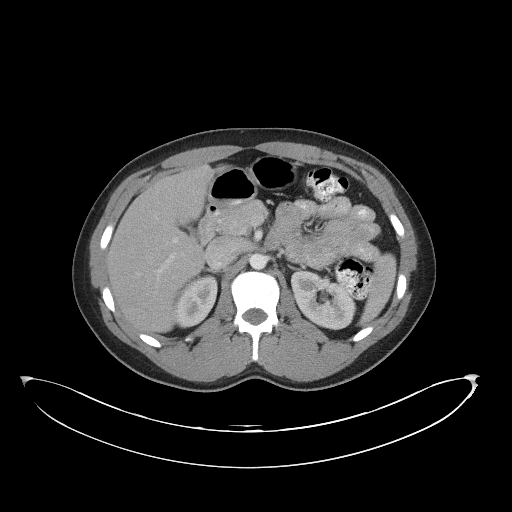
[im 96/156  soft-tissue]
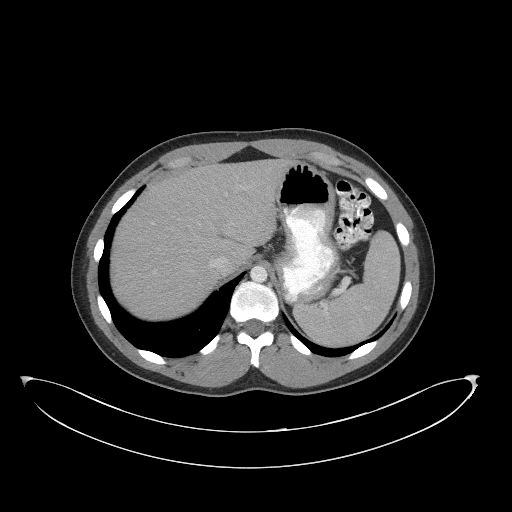
[im 108/156  soft-tissue]
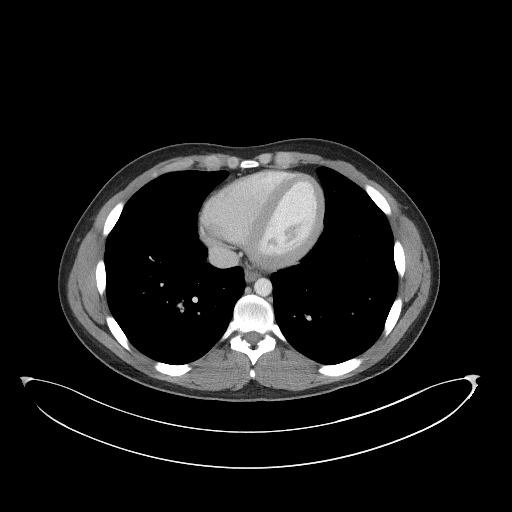
[im 120/156  soft-tissue]
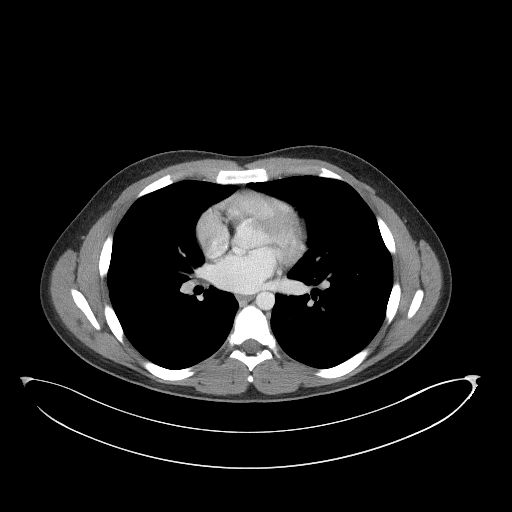
[im 120/156  bone]
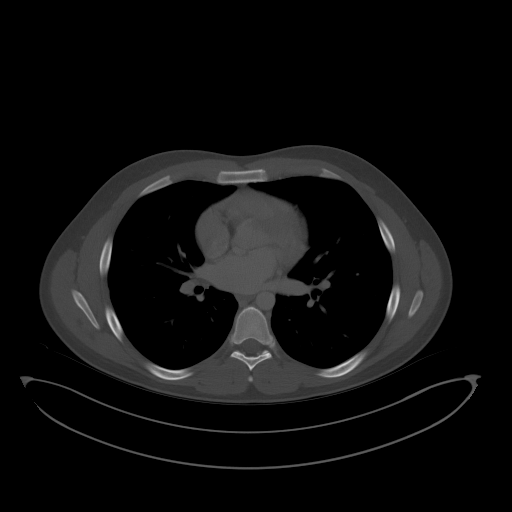
[im 132/156  soft-tissue]
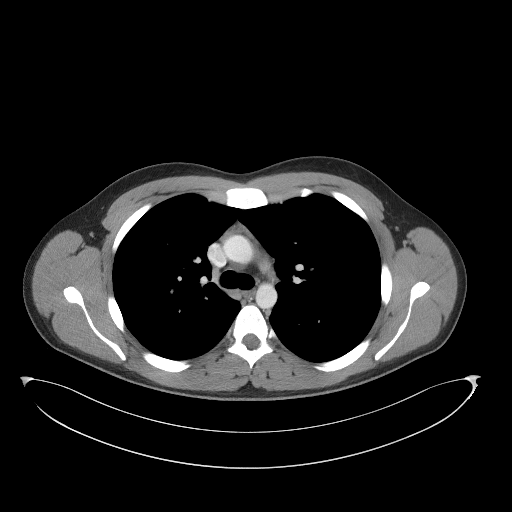
[im 144/156  soft-tissue]
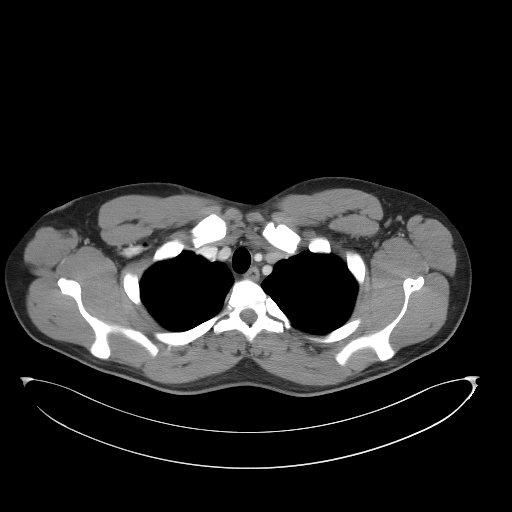

[Series 5: coronals · coronal · 0.92mm/px · 3 of 140 slices shown]
[im 47/140  soft-tissue]
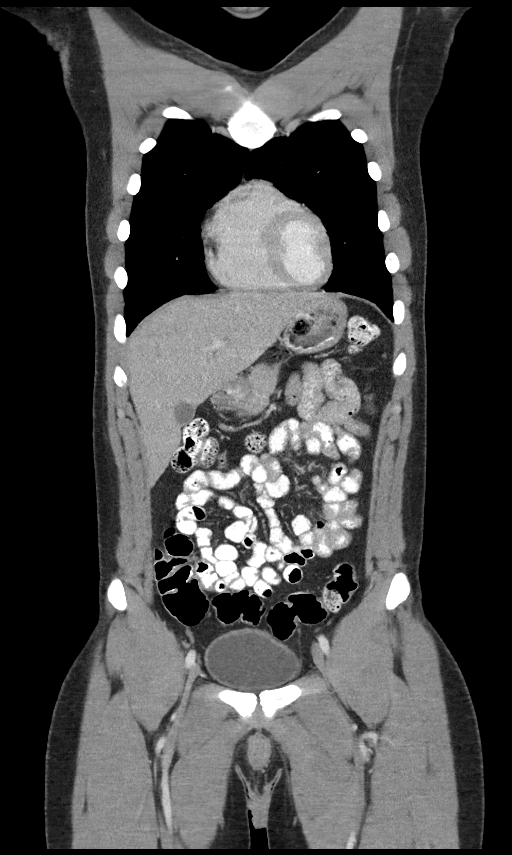
[im 62/140  soft-tissue]
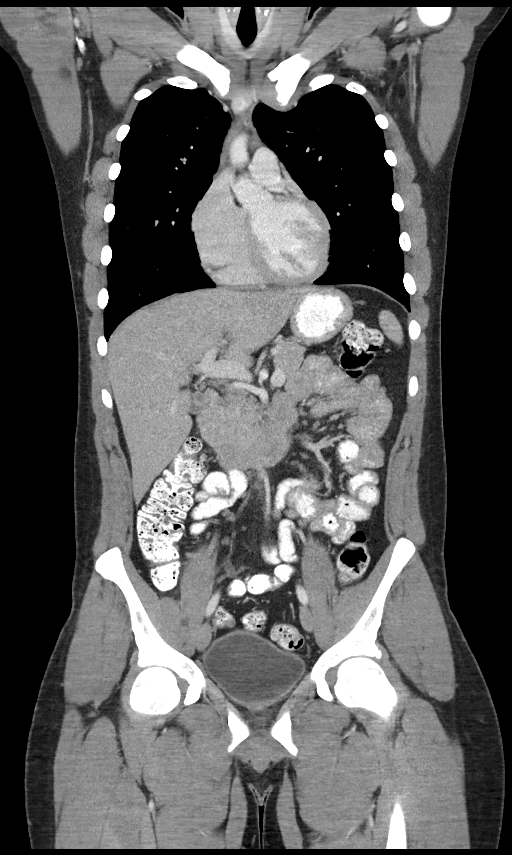
[im 78/140  soft-tissue]
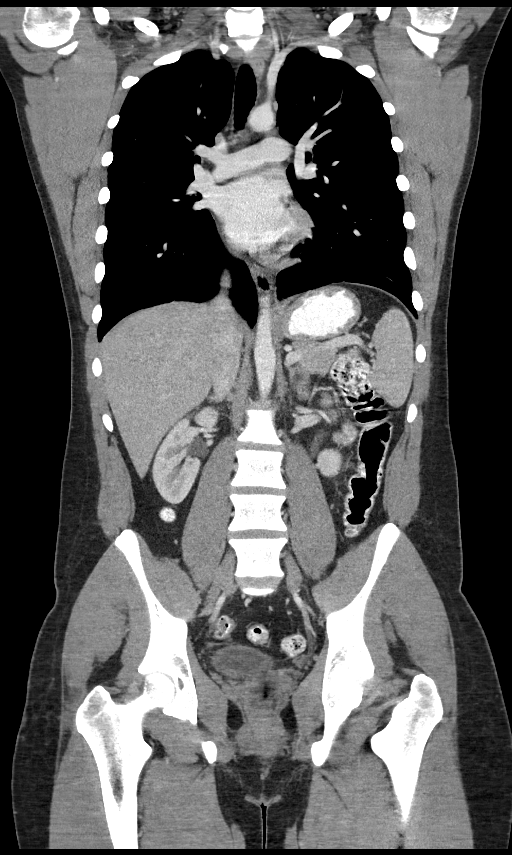

[14 of 46 positions shown; findings below may reference images not displayed]

FINDINGS: CT CHEST FINDINGS

Cardiovascular: No significant vascular findings. The heart size is
normal. There is no pericardial effusion.

Mediastinum/Nodes: There are no enlarged mediastinal, hilar or
axillary lymph nodes. There is stable residual thymic tissue in the
anterior mediastinum. The thyroid gland, trachea and esophagus
appear normal.

Lungs/Pleura: There is no pleural effusion. Residua of treated
pulmonary metastatic disease is stable with scattered ill-defined
nodularity, largest measuring 7 mm in the right lower lobe (image
159/7) and 6 mm in the left lower lobe (image 107/7). No new or
enlarging pulmonary nodules.

Musculoskeletal/Chest wall: No chest wall mass or suspicious osseous
findings. Stable mild bilateral gynecomastia.

CT ABDOMEN AND PELVIS FINDINGS

Hepatobiliary: The liver appears stable without suspicious findings.
A tiny low-density lesion in the dome of the right lobe (image 57/3)
is stable, probably a cyst. No evidence of gallstones, gallbladder
wall thickening or biliary dilatation.

Pancreas: Unremarkable. No pancreatic ductal dilatation or
surrounding inflammatory changes.

Spleen: Normal in size without focal abnormality.

Adrenals/Urinary Tract: Both adrenal glands appear normal. The
kidneys appear normal without evidence of urinary tract calculus,
suspicious lesion or hydronephrosis. No bladder abnormalities are
seen.

Stomach/Bowel: No evidence of bowel wall thickening, distention or
surrounding inflammatory change.

Vascular/Lymphatic: There are no enlarged abdominal or pelvic lymph
nodes. No acute vascular findings. Duplication of the infrarenal IVC
again noted. There are congenital variants of the celiac trunk and
renal arteries bilaterally.

Reproductive: The prostate gland and seminal vesicles appear normal.
Status post left orchiectomy.

Other: Stable small umbilical hernia containing only fat. No ascites
or peritoneal nodularity.

Musculoskeletal: No acute or significant osseous findings.
IMPRESSION: 1. Stable treated pulmonary metastatic disease with residual
ill-defined nodules bilaterally. No progressive disease identified.
2. No residual or recurrent disease identified in the abdomen or
pelvis.
3. No acute findings.

## 2019-04-21 ENCOUNTER — Encounter: Payer: Self-pay | Admitting: Hematology & Oncology

## 2019-04-21 ENCOUNTER — Ambulatory Visit (HOSPITAL_BASED_OUTPATIENT_CLINIC_OR_DEPARTMENT_OTHER)
Admission: RE | Admit: 2019-04-21 | Discharge: 2019-04-21 | Disposition: A | Discharge status: Home / Self Care | Payer: Managed Care, Other (non HMO) | Source: Ambulatory Visit | Attending: Hematology & Oncology | Admitting: Hematology & Oncology

## 2019-04-21 ENCOUNTER — Other Ambulatory Visit: Payer: Self-pay

## 2019-04-21 ENCOUNTER — Inpatient Hospital Stay: Payer: Managed Care, Other (non HMO) | Attending: Hematology & Oncology | Admitting: Hematology & Oncology

## 2019-04-21 ENCOUNTER — Inpatient Hospital Stay: Payer: Managed Care, Other (non HMO)

## 2019-04-21 VITALS — BP 125/76 | HR 54 | Temp 97.8°F | Resp 18 | Wt 230.0 lb

## 2019-04-21 DIAGNOSIS — C7800 Secondary malignant neoplasm of unspecified lung: Secondary | ICD-10-CM | POA: Insufficient documentation

## 2019-04-21 DIAGNOSIS — C6292 Malignant neoplasm of left testis, unspecified whether descended or undescended: Secondary | ICD-10-CM | POA: Insufficient documentation

## 2019-04-21 LAB — CBC WITH DIFFERENTIAL (CANCER CENTER ONLY)
Abs Immature Granulocytes: 0.01 10*3/uL (ref 0.00–0.07)
Basophils Absolute: 0 10*3/uL (ref 0.0–0.1)
Basophils Relative: 1 %
Eosinophils Absolute: 0.1 10*3/uL (ref 0.0–0.5)
Eosinophils Relative: 2 %
HCT: 42.9 % (ref 39.0–52.0)
Hemoglobin: 14.5 g/dL (ref 13.0–17.0)
Immature Granulocytes: 0 %
Lymphocytes Relative: 35 %
Lymphs Abs: 1.6 10*3/uL (ref 0.7–4.0)
MCH: 31.2 pg (ref 26.0–34.0)
MCHC: 33.8 g/dL (ref 30.0–36.0)
MCV: 92.3 fL (ref 80.0–100.0)
Monocytes Absolute: 0.4 10*3/uL (ref 0.1–1.0)
Monocytes Relative: 8 %
Neutro Abs: 2.5 10*3/uL (ref 1.7–7.7)
Neutrophils Relative %: 54 %
Platelet Count: 233 10*3/uL (ref 150–400)
RBC: 4.65 MIL/uL (ref 4.22–5.81)
RDW: 11.3 % — ABNORMAL LOW (ref 11.5–15.5)
WBC Count: 4.6 10*3/uL (ref 4.0–10.5)
nRBC: 0 % (ref 0.0–0.2)

## 2019-04-21 LAB — CMP (CANCER CENTER ONLY)
ALT: 14 U/L (ref 0–44)
AST: 19 U/L (ref 15–41)
Albumin: 4.9 g/dL (ref 3.5–5.0)
Alkaline Phosphatase: 75 U/L (ref 38–126)
Anion gap: 5 (ref 5–15)
BUN: 20 mg/dL (ref 6–20)
CO2: 29 mmol/L (ref 22–32)
Calcium: 9.4 mg/dL (ref 8.9–10.3)
Chloride: 106 mmol/L (ref 98–111)
Creatinine: 0.94 mg/dL (ref 0.61–1.24)
GFR, Est AFR Am: >60 mL/min (ref >60)
GFR, Estimated: >60 mL/min (ref >60)
Glucose, Bld: 109 mg/dL — ABNORMAL HIGH (ref 70–99)
Potassium: 4.5 mmol/L (ref 3.5–5.1)
Sodium: 140 mmol/L (ref 135–145)
Total Bilirubin: 0.6 mg/dL (ref 0.3–1.2)
Total Protein: 7.2 g/dL (ref 6.5–8.1)

## 2019-04-21 LAB — LACTATE DEHYDROGENASE: LDH: 223 U/L — ABNORMAL HIGH (ref 98–192)

## 2019-04-21 MED ORDER — IOHEXOL 300 MG/ML  SOLN
100.0000 mL | Freq: Once | INTRAMUSCULAR | Status: AC | PRN
  Administered 2019-04-21: 11:00:00 100 mL via INTRAVENOUS

## 2019-04-21 NOTE — Progress Notes (Signed)
Hematology and Oncology Follow Up Visit  Kristine Tiley [id number] [birth year] 23 y.o. 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 is  back for follow-up.  He looks fantastic.  He has been very busy.  He is trying to finish up building his new house.  He had a CT scan done today.  Thankfully, the CT scan did not show any evidence of recurrence of the testicular cancer.  When he was last here in May, his alpha-fetoprotein was 4.2.  His beta-hCG was less than 1.  He has had no problems with cough.  He has had no nausea or vomiting.  There is been no change in bowel or bladder habits.  He is taking a natural testosterone supplement.  I do not see any problems with him doing this.  He has had no leg swelling.  He has had no rashes.  He has had no headache.  Overall, his performance status is ECOG 0.    Medications: No current outpatient medications on file. No current facility-administered medications for this visit.   Facility-Administered Medications Ordered in Other Visits:  .  pegfilgrastim (NEULASTA ONPRO KIT) injection 6 mg, 6 mg, Subcutaneous, Once, [place], Holli Humbles, NP  Allergies: No Known Allergies  Past Medical History, Surgical history, Social history, and Family History were reviewed and updated.  Review of Systems: Review of Systems  Constitutional: Positive for fatigue.  HENT:  Negative.   Eyes: Negative.   Respiratory: Positive for shortness of breath.   Cardiovascular: Negative.   Gastrointestinal: Positive for nausea.  Endocrine: Negative.   Genitourinary: Negative.    Musculoskeletal: Negative.   Skin: Negative.   Neurological: Negative.   Hematological: Negative.   Psychiatric/Behavioral: Negative.     Physical Exam:  weight is 230 lb (104.3 kg). His temporal  temperature is 97.8 F (36.6 C). His blood pressure is 125/76 and his pulse is 54 (abnormal). His respiration is 18 and oxygen saturation is 100%.   Wt Readings from Last 3 Encounters:  04/21/19 230 lb (104.3 kg)  10/21/18 225 lb (102.1 kg)  06/14/18 222 lb (100.7 kg)    Physical Exam Vitals signs reviewed.  HENT:     Head: Normocephalic and atraumatic.  Eyes:     Pupils: Pupils are equal, round, and reactive to light.  Neck:     Musculoskeletal: Normal range of motion.  Cardiovascular:     Rate and Rhythm: Normal rate and regular rhythm.     Heart sounds: Normal heart sounds.  Pulmonary:     Effort: Pulmonary effort is normal.     Breath sounds: Normal breath sounds.  Abdominal:     General: Bowel sounds are normal.     Palpations: Abdomen is soft.     Comments: Has a well-healed left inguinal orchiectomy scar.  There is no swelling.  There is no erythema.  There is no tenderness.  Musculoskeletal: Normal range of motion.        General: No tenderness or deformity.  Lymphadenopathy:     Cervical: No cervical adenopathy.  Skin:    General: Skin is warm and dry.     Findings: No erythema or rash.  Neurological:     Mental Status: He is alert and oriented to person, place, and time.  Psychiatric:  Behavior: Behavior normal.        Thought Content: Thought content normal.        Judgment: Judgment normal.      Lab Results  Component Value Date   WBC 4.6 04/21/2019   HGB 14.5 04/21/2019   HCT 42.9 04/21/2019   MCV 92.3 04/21/2019   PLT 233 04/21/2019     Chemistry      Component Value Date/Time   NA 140 04/21/2019 0923   K 4.5 04/21/2019 0923   CL 106 04/21/2019 0923   CO2 29 04/21/2019 0923   BUN 20 04/21/2019 0923   CREATININE 0.94 04/21/2019 0923      Component Value Date/Time   CALCIUM 9.4 04/21/2019 0923   ALKPHOS 75 04/21/2019 0923   AST 19 04/21/2019 0923   ALT 14 04/21/2019 0923   BILITOT 0.6 04/21/2019 0923      Impression and Plan:  Mr. Brandenburg is a 23 year old white male with metastatic embryonal cell carcinoma.  He had a left orchiectomy.  This was performed back on January 18.  A week later, he showed up in the emergency room with chest discomfort and some shortness of breath.  He had innumerable pulmonary nodules.  I see no evidence of recurrent disease on him.  I really think that he is going to be cured.  I think we can get him back in 6 months with follow-up.  We will get another CT scan on him when we see him back.  I am just happy that his quality life is doing so well.  He really is amazing as to what he can do now.  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  He thinks about getting back into fighting.  I have given credit for try to do this.  He says he feels well enough that he could give it a try.  [name], MD 11/5/202012:10 PM

## 2019-04-22 ENCOUNTER — Encounter: Payer: Self-pay | Admitting: *Deleted

## 2019-04-22 LAB — BETA HCG QUANT (REF LAB): hCG Quant: <1 m[IU]/mL (ref 0–3)

## 2019-04-22 LAB — AFP TUMOR MARKER: AFP, Serum, Tumor Marker: 4.5 ng/mL (ref 0.0–8.3)

## 2019-06-06 ENCOUNTER — Telehealth: Payer: Managed Care, Other (non HMO) | Admitting: Physician Assistant

## 2019-06-06 DIAGNOSIS — Z20822 Contact with and (suspected) exposure to covid-19: Secondary | ICD-10-CM

## 2019-06-06 DIAGNOSIS — R509 Fever, unspecified: Secondary | ICD-10-CM

## 2019-06-06 MED ORDER — BENZONATATE 100 MG PO CAPS: 100.0000 mg | ORAL_CAPSULE | Freq: Three times a day (TID) | ORAL | 0 refills | Status: DC | PRN

## 2019-06-06 NOTE — Progress Notes (Signed)
E-Visit for Corona Virus Screening   Your current symptoms could be consistent with the coronavirus.  Many health care providers can now test patients at their office but not all are.  [facility] has multiple testing sites. For information on our [place] testing locations and hours go to [email or web address]  We are enrolling you in our [place] for [place] . Daily you will receive a questionnaire within the [facility] website. Our COVID 19 response team will be monitoring your responses daily.  Testing Information: The COVID-19 Community Testing sites will begin testing BY APPOINTMENT ONLY.  You can schedule online at [email or web address]  If you do not have access to a smart phone or computer you may call [phone] for an appointment.  Testing Locations: Appointment schedule is 8 am to 3:30 pm at all sites  [facility] indoors at [address], [place] [address] [facility]  indoors at [place]. [address] [place], [place] [address] [place] indoors at [address], [place] [address]  Additional testing sites in the Community:  . For [facility] Testing sites in [facility]  FaceUpdate.uy  . For Pop-up testing sites in [place]  BowlDirectory.co.uk  . For Testing sites with regular hours [email or web address]  . For [place] [email or web address]  . For [facility] [email or web address]  . For [facility] testing in [place] and [employer] [email or web address]  . For [facility] testing in [facility]   [email or web address]  For  more  information about community testing call [phone]   We are enrolling you in our [place] for [place] . Daily you will receive a questionnaire within the [place] website. Our COVID 19 response team will be monitoring your responses daily.  Please quarantine yourself while awaiting your test results. If you develop fever/cough/breathlessness, please stay home for 10 days with improving symptoms and until you have had 24 hours of no fever (without taking a fever reducer).  You should wear a mask or cloth face covering over your nose and mouth if you must be around other people or animals, including pets (even at home). Try to stay at least 6 feet away from other people. This will protect the people around you.  Please continue good preventive care measures, including:  frequent hand-washing, avoid touching your face, cover coughs/sneezes, stay out of crowds and keep a 6 foot distance from others.  COVID-19 is a respiratory illness with symptoms that are similar to the flu. Symptoms are typically mild to moderate, but there have been cases of severe illness and death due to the virus.   The following symptoms may appear 2-14 days after exposure: . Fever . Cough . Shortness of breath or difficulty breathing . Chills . Repeated shaking with chills . Muscle pain . Headache . Sore throat . New loss of taste or smell . Fatigue . Congestion or runny nose . Nausea or vomiting . Diarrhea  Go to the nearest hospital ED for assessment if fever/cough/breathlessness are severe or illness seems like a threat to life.  It is vitally important that if you feel that you have an infection such as this virus or any other virus that you stay home and away from places where you may spread it to others.  You should avoid contact with people age 25 and older.   You can use medication such as A prescription cough medication called Tessalon Perles 100 mg. You may take 1-2 capsules every 8 hours as  needed for cough  You may also take acetaminophen (Tylenol) as needed for fever. 1-2 tablets every 6 hours as needed for fever/pain.   Reduce your risk of any infection by using the same precautions used for avoiding the common cold or flu:  Mar[place] Kitchen Wash your hands often with soap and warm water for at least 20 seconds.  If soap and water are not readily available, use an alcohol-based hand sanitizer with at least 60% alcohol.  . If coughing or sneezing, cover your mouth and nose by coughing or sneezing into the elbow areas of your shirt or coat, into a tissue or into your sleeve (not your hands). . Avoid shaking hands with others and consider head nods or verbal greetings only. . Avoid touching your eyes, nose, or mouth with unwashed hands.  . Avoid close contact with people who are sick. . Avoid places or events with large numbers of people in one location, like concerts or sporting events. . Carefully consider travel plans you have or are making. . If you are planning any travel outside or inside the [place], visit the CDC's Travelers' Health webpage for the latest health notices. . If you have some symptoms but not all symptoms, continue to monitor at home and seek medical attention if your symptoms worsen. . If you are having a medical emergency, call 911.  HOME CARE . Only take medications as instructed by your medical team. . Drink plenty of fluids and get plenty of rest. . A steam or ultrasonic humidifier can help if you have congestion.   GET HELP RIGHT AWAY IF YOU HAVE EMERGENCY WARNING SIGNS** FOR COVID-19. If you or someone is showing any of these signs seek emergency medical care immediately. Call 911 or proceed to your closest emergency facility if: . You develop worsening high fever. . Trouble breathing . Bluish lips or face . Persistent pain or pressure in the chest . New confusion . Inability to wake or stay awake . You cough up blood. . Your symptoms become more severe  **This  list is not all possible symptoms. Contact your medical provider for any symptoms that are sever or concerning to you.  MAKE SURE YOU   Understand these instructions.  Will watch your condition.  Will get help right away if you are not doing well or get worse.  Your e-visit answers were reviewed by a board certified advanced clinical practitioner to complete your personal care plan.  Depending on the condition, your plan could have included both over the counter or prescription medications.  If there is a problem please reply once you have received a response from your provider.  Your safety is important to [place].  If you have drug allergies check your prescription carefully.    You can use MyChart to ask questions about today's visit, request a non-urgent call back, or ask for a work or school excuse for 24 hours related to this e-Visit. If it has been greater than 24 hours you will need to follow up with your provider, or enter a new e-Visit to address those concerns. You will get an e-mail in the next two days asking about your experience.  I hope that your e-visit has been valuable and will speed your recovery. Thank you for using e-visits.  Greater than 5 minutes, yet less than 10 minutes of time have been spent researching, coordinating, and implementing care for this patient today.

## 2019-10-18 ENCOUNTER — Telehealth: Payer: Self-pay | Admitting: Hematology & Oncology

## 2019-10-18 NOTE — Telephone Encounter (Signed)
I called patient to reschedule his appointments with Dr Marin Olp since he had moved his CT scan appts.  He was ok with new date/time of 5/18 @ 12:00

## 2019-10-19 ENCOUNTER — Ambulatory Visit (HOSPITAL_BASED_OUTPATIENT_CLINIC_OR_DEPARTMENT_OTHER): Payer: Managed Care, Other (non HMO)

## 2019-10-19 ENCOUNTER — Inpatient Hospital Stay: Payer: Managed Care, Other (non HMO)

## 2019-10-19 ENCOUNTER — Inpatient Hospital Stay: Payer: Managed Care, Other (non HMO) | Admitting: Hematology & Oncology

## 2019-10-27 ENCOUNTER — Other Ambulatory Visit (HOSPITAL_BASED_OUTPATIENT_CLINIC_OR_DEPARTMENT_OTHER): Payer: Managed Care, Other (non HMO)

## 2019-10-27 ENCOUNTER — Ambulatory Visit (HOSPITAL_BASED_OUTPATIENT_CLINIC_OR_DEPARTMENT_OTHER): Payer: Managed Care, Other (non HMO)

## 2019-11-01 ENCOUNTER — Telehealth: Payer: Self-pay | Admitting: Hematology & Oncology

## 2019-11-01 ENCOUNTER — Encounter: Payer: Self-pay | Admitting: Hematology & Oncology

## 2019-11-01 ENCOUNTER — Other Ambulatory Visit: Payer: Self-pay

## 2019-11-01 ENCOUNTER — Inpatient Hospital Stay (HOSPITAL_BASED_OUTPATIENT_CLINIC_OR_DEPARTMENT_OTHER): Payer: Managed Care, Other (non HMO) | Admitting: Hematology & Oncology

## 2019-11-01 ENCOUNTER — Ambulatory Visit (HOSPITAL_BASED_OUTPATIENT_CLINIC_OR_DEPARTMENT_OTHER)
Admission: RE | Admit: 2019-11-01 | Discharge: 2019-11-01 | Disposition: A | Discharge status: Home / Self Care | Payer: Managed Care, Other (non HMO) | Source: Ambulatory Visit | Attending: Hematology & Oncology | Admitting: Hematology & Oncology

## 2019-11-01 ENCOUNTER — Inpatient Hospital Stay: Payer: Managed Care, Other (non HMO) | Attending: Hematology & Oncology

## 2019-11-01 VITALS — BP 150/86 | HR 89 | Temp 97.7°F | Resp 20 | Wt 202.0 lb

## 2019-11-01 DIAGNOSIS — C6292 Malignant neoplasm of left testis, unspecified whether descended or undescended: Secondary | ICD-10-CM

## 2019-11-01 LAB — CBC WITH DIFFERENTIAL (CANCER CENTER ONLY)
Abs Immature Granulocytes: 0.01 10*3/uL (ref 0.00–0.07)
Basophils Absolute: 0 10*3/uL (ref 0.0–0.1)
Basophils Relative: 1 %
Eosinophils Absolute: 0.1 10*3/uL (ref 0.0–0.5)
Eosinophils Relative: 3 %
HCT: 40.1 % (ref 39.0–52.0)
Hemoglobin: 13.3 g/dL (ref 13.0–17.0)
Immature Granulocytes: 0 %
Lymphocytes Relative: 34 %
Lymphs Abs: 1.5 10*3/uL (ref 0.7–4.0)
MCH: 30.9 pg (ref 26.0–34.0)
MCHC: 33.2 g/dL (ref 30.0–36.0)
MCV: 93.3 fL (ref 80.0–100.0)
Monocytes Absolute: 0.3 10*3/uL (ref 0.1–1.0)
Monocytes Relative: 6 %
Neutro Abs: 2.4 10*3/uL (ref 1.7–7.7)
Neutrophils Relative %: 56 %
Platelet Count: 198 10*3/uL (ref 150–400)
RBC: 4.3 MIL/uL (ref 4.22–5.81)
RDW: 11.8 % (ref 11.5–15.5)
WBC Count: 4.4 10*3/uL (ref 4.0–10.5)
nRBC: 0 % (ref 0.0–0.2)

## 2019-11-01 LAB — CMP (CANCER CENTER ONLY)
ALT: 18 U/L (ref 0–44)
AST: 23 U/L (ref 15–41)
Albumin: 4.4 g/dL (ref 3.5–5.0)
Alkaline Phosphatase: 76 U/L (ref 38–126)
Anion gap: 9 (ref 5–15)
BUN: 16 mg/dL (ref 6–20)
CO2: 28 mmol/L (ref 22–32)
Calcium: 9.5 mg/dL (ref 8.9–10.3)
Chloride: 101 mmol/L (ref 98–111)
Creatinine: 1.02 mg/dL (ref 0.61–1.24)
GFR, Est AFR Am: >60 mL/min (ref >60)
GFR, Estimated: >60 mL/min (ref >60)
Glucose, Bld: 142 mg/dL — ABNORMAL HIGH (ref 70–99)
Potassium: 3.9 mmol/L (ref 3.5–5.1)
Sodium: 138 mmol/L (ref 135–145)
Total Bilirubin: 0.7 mg/dL (ref 0.3–1.2)
Total Protein: 6.2 g/dL — ABNORMAL LOW (ref 6.5–8.1)

## 2019-11-01 LAB — LACTATE DEHYDROGENASE: LDH: 195 U/L — ABNORMAL HIGH (ref 98–192)

## 2019-11-01 MED ORDER — IOHEXOL 300 MG/ML  SOLN
100.0000 mL | Freq: Once | INTRAMUSCULAR | Status: AC | PRN
  Administered 2019-11-01: 100 mL via INTRAVENOUS

## 2019-11-01 NOTE — Progress Notes (Signed)
Hematology and Oncology Follow Up Visit  Daniel Shepherd [id number] [birth year] 23 y.o. 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  He looks fantastic.  He has been very busy. He finished building his house. Now, he and his father are busy trying to build other houses. They are trying to find land to build on.  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 He seems to enjoy doing this. I am sure that he is very good at this sport.  He did have CAT scans done today. The CAT scans did not show any evidence of recurrent testicular cancer.  We last saw him 6 months ago. He had normal tumor markers. With Daniel Shepherd, it is the alpha-fetoprotein that really is the key as to whether or not he has recurrence.  He has had no issues with cough or shortness of breath. He has had no problems with bowels or bladder. He has had no fever. He is yet to have the coronavirus vaccine.  Overall, his performance status is ECOG 0.    Medications:  Current Outpatient Medications:  .  co-enzyme Q-10 30 MG capsule, Take 30 mg by mouth daily., Disp: , Rfl:  .  Creatine Monohydrate POWD, Take by mouth daily., Disp: , Rfl:  .  Multiple Vitamin (MULTIVITAMIN) tablet, Take 1 tablet by mouth daily., Disp: , Rfl:  .  OVER THE COUNTER MEDICATION, Testosterone booster caspule- 2 capsules daily., Disp: , Rfl:  No current facility-administered medications for this visit.  Facility-Administered Medications Ordered in Other Visits:  .  pegfilgrastim (NEULASTA ONPRO KIT) injection 6 mg, 6 mg, Subcutaneous, Once, [place], Holli Humbles, NP  Allergies: No Known Allergies  Past Medical History, Surgical history, Social history, and Family History were reviewed and updated.  Review of Systems: Review of Systems    Constitutional: Positive for fatigue.  HENT:  Negative.   Eyes: Negative.   Respiratory: Positive for shortness of breath.   Cardiovascular: Negative.   Gastrointestinal: Positive for nausea.  Endocrine: Negative.   Genitourinary: Negative.    Musculoskeletal: Negative.   Skin: Negative.   Neurological: Negative.   Hematological: Negative.   Psychiatric/Behavioral: Negative.     Physical Exam:  weight is 202 lb (91.6 kg). His temporal temperature is 97.7 F (36.5 C). His blood pressure is 150/86 (abnormal) and his pulse is 89. His respiration is 20 and oxygen saturation is 100%.   Wt Readings from Last 3 Encounters:  11/01/19 202 lb (91.6 kg)  04/21/19 230 lb (104.3 kg)  10/21/18 225 lb (102.1 kg)    Physical Exam Vitals reviewed.  HENT:     Head: Normocephalic and atraumatic.  Eyes:     Pupils: Pupils are equal, round, and reactive to light.  Cardiovascular:     Rate and Rhythm: Normal rate and regular rhythm.     Heart sounds: Normal heart sounds.  Pulmonary:     Effort: Pulmonary effort is normal.     Breath sounds: Normal breath sounds.  Abdominal:     General: Bowel sounds are normal.     Palpations: Abdomen is soft.     Comments: Has a well-healed left inguinal orchiectomy scar.  There is no swelling.  There is no erythema.  There is no tenderness.  Musculoskeletal:  General: No tenderness or deformity. Normal range of motion.     Cervical back: Normal range of motion.  Lymphadenopathy:     Cervical: No cervical adenopathy.  Skin:    General: Skin is warm and dry.     Findings: No erythema or rash.  Neurological:     Mental Status: He is alert and oriented to person, place, and time.  Psychiatric:        Behavior: Behavior normal.        Thought Content: Thought content normal.        Judgment: Judgment normal.      Lab Results  Component Value Date   WBC 4.6 04/21/2019   HGB 14.5 04/21/2019   HCT 42.9 04/21/2019   MCV 92.3 04/21/2019    PLT 233 04/21/2019     Chemistry      Component Value Date/Time   NA 140 04/21/2019 0923   K 4.5 04/21/2019 0923   CL 106 04/21/2019 0923   CO2 29 04/21/2019 0923   BUN 20 04/21/2019 0923   CREATININE 0.94 04/21/2019 0923      Component Value Date/Time   CALCIUM 9.4 04/21/2019 0923   ALKPHOS 75 04/21/2019 0923   AST 19 04/21/2019 0923   ALT 14 04/21/2019 0923   BILITOT 0.6 04/21/2019 0923      Impression and Plan: Daniel Shepherd is a 24 year old white male with metastatic embryonal cell carcinoma.  He had a left orchiectomy.  This was performed back on January 18.  A week later, he showed up in the emergency room with chest discomfort and some shortness of breath.  He had innumerable pulmonary nodules.  I see no evidence of recurrent disease on him.  I really think that he is going to be cured. It has now been 2 years.  We will continue to check him every 6 months for 5 years total. I think this would be considered standard.  I am just happy that his quality life is doing well. His 2 children are growing up.  We will see him back in 6 months.    [name], MD 5/18/20211:05 PM

## 2019-11-01 NOTE — Telephone Encounter (Signed)
Appointments scheduled calendar printed per 5/18 los 

## 2019-11-02 LAB — BETA HCG QUANT (REF LAB): hCG Quant: <1 m[IU]/mL (ref 0–3)

## 2019-11-02 LAB — AFP TUMOR MARKER: AFP, Serum, Tumor Marker: 4.2 ng/mL (ref 0.0–8.3)

## 2020-05-02 ENCOUNTER — Encounter (HOSPITAL_BASED_OUTPATIENT_CLINIC_OR_DEPARTMENT_OTHER): Payer: Self-pay

## 2020-05-02 ENCOUNTER — Inpatient Hospital Stay (HOSPITAL_BASED_OUTPATIENT_CLINIC_OR_DEPARTMENT_OTHER): Payer: Self-pay | Admitting: Hematology & Oncology

## 2020-05-02 ENCOUNTER — Encounter: Payer: Self-pay | Admitting: Hematology & Oncology

## 2020-05-02 ENCOUNTER — Ambulatory Visit (HOSPITAL_BASED_OUTPATIENT_CLINIC_OR_DEPARTMENT_OTHER)
Admission: RE | Admit: 2020-05-02 | Discharge: 2020-05-02 | Disposition: A | Discharge status: Home / Self Care | Payer: Self-pay | Source: Ambulatory Visit | Attending: Hematology & Oncology | Admitting: Hematology & Oncology

## 2020-05-02 ENCOUNTER — Other Ambulatory Visit: Payer: Self-pay

## 2020-05-02 ENCOUNTER — Inpatient Hospital Stay: Payer: Self-pay | Attending: Hematology & Oncology

## 2020-05-02 VITALS — BP 147/60 | HR 61 | Temp 98.8°F | Resp 20 | Wt 200.1 lb

## 2020-05-02 DIAGNOSIS — Z9221 Personal history of antineoplastic chemotherapy: Secondary | ICD-10-CM | POA: Insufficient documentation

## 2020-05-02 DIAGNOSIS — C6292 Malignant neoplasm of left testis, unspecified whether descended or undescended: Secondary | ICD-10-CM

## 2020-05-02 DIAGNOSIS — C7800 Secondary malignant neoplasm of unspecified lung: Secondary | ICD-10-CM | POA: Insufficient documentation

## 2020-05-02 LAB — CBC WITH DIFFERENTIAL (CANCER CENTER ONLY)
Abs Immature Granulocytes: 0.02 10*3/uL (ref 0.00–0.07)
Basophils Absolute: 0 10*3/uL (ref 0.0–0.1)
Basophils Relative: 1 %
Eosinophils Absolute: 0.3 10*3/uL (ref 0.0–0.5)
Eosinophils Relative: 5 %
HCT: 41.1 % (ref 39.0–52.0)
Hemoglobin: 13.9 g/dL (ref 13.0–17.0)
Immature Granulocytes: 0 %
Lymphocytes Relative: 31 %
Lymphs Abs: 1.6 10*3/uL (ref 0.7–4.0)
MCH: 31.6 pg (ref 26.0–34.0)
MCHC: 33.8 g/dL (ref 30.0–36.0)
MCV: 93.4 fL (ref 80.0–100.0)
Monocytes Absolute: 0.4 10*3/uL (ref 0.1–1.0)
Monocytes Relative: 7 %
Neutro Abs: 2.8 10*3/uL (ref 1.7–7.7)
Neutrophils Relative %: 56 %
Platelet Count: 196 10*3/uL (ref 150–400)
RBC: 4.4 MIL/uL (ref 4.22–5.81)
RDW: 11.4 % — ABNORMAL LOW (ref 11.5–15.5)
WBC Count: 5 10*3/uL (ref 4.0–10.5)
nRBC: 0 % (ref 0.0–0.2)

## 2020-05-02 LAB — CMP (CANCER CENTER ONLY)
ALT: 13 U/L (ref 0–44)
AST: 18 U/L (ref 15–41)
Albumin: 4.4 g/dL (ref 3.5–5.0)
Alkaline Phosphatase: 65 U/L (ref 38–126)
Anion gap: 6 (ref 5–15)
BUN: 12 mg/dL (ref 6–20)
CO2: 30 mmol/L (ref 22–32)
Calcium: 9.5 mg/dL (ref 8.9–10.3)
Chloride: 102 mmol/L (ref 98–111)
Creatinine: 1.05 mg/dL (ref 0.61–1.24)
GFR, Estimated: >60 mL/min (ref >60)
Glucose, Bld: 112 mg/dL — ABNORMAL HIGH (ref 70–99)
Potassium: 4.1 mmol/L (ref 3.5–5.1)
Sodium: 138 mmol/L (ref 135–145)
Total Bilirubin: 0.6 mg/dL (ref 0.3–1.2)
Total Protein: 7.2 g/dL (ref 6.5–8.1)

## 2020-05-02 LAB — LACTATE DEHYDROGENASE: LDH: 145 U/L (ref 98–192)

## 2020-05-02 MED ORDER — IOHEXOL 300 MG/ML  SOLN
100.0000 mL | Freq: Once | INTRAMUSCULAR | Status: AC | PRN
  Administered 2020-05-02: 100 mL via INTRAVENOUS
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
## 2020-05-03 ENCOUNTER — Telehealth: Payer: Self-pay

## 2020-05-03 LAB — BETA HCG QUANT (REF LAB): hCG Quant: <1 m[IU]/mL (ref 0–3)

## 2020-05-03 LAB — AFP TUMOR MARKER: AFP, Serum, Tumor Marker: 4.6 ng/mL (ref 0.0–8.3)

## 2020-05-03 NOTE — Telephone Encounter (Signed)
Called and left a vm with may 2022 appts per 05/02/20 los, advised pt to p/u contrast and that we will call at a later date with ct appt once approved... AOM

## 2020-05-04 ENCOUNTER — Ambulatory Visit: Payer: Managed Care, Other (non HMO) | Admitting: Hematology & Oncology

## 2020-05-04 ENCOUNTER — Other Ambulatory Visit: Payer: Managed Care, Other (non HMO)

## 2020-10-30 ENCOUNTER — Ambulatory Visit (HOSPITAL_BASED_OUTPATIENT_CLINIC_OR_DEPARTMENT_OTHER): Admission: RE | Admit: 2020-10-30 | Payer: Managed Care, Other (non HMO) | Source: Ambulatory Visit

## 2020-10-30 ENCOUNTER — Telehealth: Payer: Self-pay

## 2020-10-30 ENCOUNTER — Inpatient Hospital Stay: Payer: Managed Care, Other (non HMO)

## 2020-10-30 DIAGNOSIS — Z9221 Personal history of antineoplastic chemotherapy: Secondary | ICD-10-CM | POA: Insufficient documentation

## 2020-10-30 DIAGNOSIS — C7800 Secondary malignant neoplasm of unspecified lung: Secondary | ICD-10-CM | POA: Insufficient documentation

## 2020-10-30 DIAGNOSIS — C6292 Malignant neoplasm of left testis, unspecified whether descended or undescended: Secondary | ICD-10-CM | POA: Insufficient documentation

## 2020-10-30 NOTE — Telephone Encounter (Signed)
Per secure chat from [facility] the pt contacted them as they forgotr their appts today for lab and scan, per message pt will call us to r/s     Daniel Shepherd

## 2020-11-05 ENCOUNTER — Other Ambulatory Visit: Payer: Self-pay

## 2020-11-05 ENCOUNTER — Encounter: Payer: Self-pay | Admitting: Hematology & Oncology

## 2020-11-05 ENCOUNTER — Inpatient Hospital Stay (HOSPITAL_BASED_OUTPATIENT_CLINIC_OR_DEPARTMENT_OTHER): Payer: Managed Care, Other (non HMO) | Admitting: Hematology & Oncology

## 2020-11-05 VITALS — BP 143/83 | HR 59 | Temp 99.0°F | Resp 20 | Wt 221.1 lb

## 2020-11-05 DIAGNOSIS — C6292 Malignant neoplasm of left testis, unspecified whether descended or undescended: Secondary | ICD-10-CM

## 2020-11-05 NOTE — Progress Notes (Signed)
Hematology and Oncology Follow Up Visit  Daniel Shepherd [id number] May 25, 1996 25 y.o. 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  Daniel Shepherd looks fantastic.  Daniel Shepherd has been very busy.  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  Thankfully, Daniel Shepherd has not had a problem finding workers.  Daniel Shepherd has had problems with the cost of material.  Daniel Shepherd feels well.  Daniel Shepherd has had no problems with nausea or vomiting.  Has had no change in bowel or bladder habits.  Daniel Shepherd has had no issues with COVID.  Daniel Shepherd apparently had COVID a couple years ago.  Is now been little over 3 years that Daniel Shepherd completed chemotherapy for his metastatic testicular cancer.  Daniel Shepherd has not yet had his CAT scan done.  We will have to set this up for a week or so.  His last tumor markers show an alpha-fetoprotein of 4.6 and beta-hCG less than 1.  This was back in November.  Currently, his performance status is ECOG 0.  Medications:  Current Outpatient Medications:  Marland Kitchen  Multiple Vitamin (MULTIVITAMIN) tablet, Take 1 tablet by mouth daily., Disp: , Rfl:  .  co-enzyme Q-10 30 MG capsule, Take 30 mg by mouth daily. (Patient not taking: Reported on 11/05/2020), Disp: , Rfl:  .  Creatine Monohydrate POWD, Take by mouth daily. (Patient not taking: Reported on 11/05/2020), Disp: , Rfl:  .  EPINEPHrine 0.3 mg/0.3 mL IJ SOAJ injection, Inject into the skin. (Patient not taking: Reported on 11/05/2020), Disp: , Rfl:  .  OVER THE COUNTER MEDICATION, Testosterone booster caspule- 2 capsules daily. (Patient not taking: Reported on 11/05/2020), Disp: , Rfl:  No current facility-administered medications for this visit.  Facility-Administered Medications Ordered in Other Visits:  .  pegfilgrastim (NEULASTA ONPRO KIT) injection 6 mg, 6 mg, Subcutaneous, Once,  [place], Holli Humbles, NP  Allergies:  Allergies  Allergen Reactions  . Other Swelling    Bee allergy    Past Medical History, Surgical history, Social history, and Family History were reviewed and updated.  Review of Systems: Review of Systems  Constitutional: Positive for fatigue.  HENT:  Negative.   Eyes: Negative.   Respiratory: Positive for shortness of breath.   Cardiovascular: Negative.   Gastrointestinal: Positive for nausea.  Endocrine: Negative.   Genitourinary: Negative.    Musculoskeletal: Negative.   Skin: Negative.   Neurological: Negative.   Hematological: Negative.   Psychiatric/Behavioral: Negative.     Physical Exam:  weight is 221 lb 1.3 oz (100.3 kg). His oral temperature is 99 F (37.2 C). His blood pressure is 143/83 (abnormal) and his pulse is 59 (abnormal). His respiration is 20 and oxygen saturation is 100%.   Wt Readings from Last 3 Encounters:  11/05/20 221 lb 1.3 oz (100.3 kg)  05/02/20 200 lb 1.3 oz (90.8 kg)  11/01/19 202 lb (91.6 kg)    Physical Exam Vitals reviewed.  HENT:     Head: Normocephalic and atraumatic.  Eyes:     Pupils: Pupils are equal, round, and reactive to light.  Cardiovascular:     Rate and Rhythm: Normal rate and regular rhythm.     Heart sounds: Normal heart sounds.  Pulmonary:     Effort: Pulmonary effort is normal.     Breath sounds:  Normal breath sounds.  Abdominal:     General: Bowel sounds are normal.     Palpations: Abdomen is soft.     Comments: Has a well-healed left inguinal orchiectomy scar.  There is no swelling.  There is no erythema.  There is no tenderness.  Musculoskeletal:        General: No tenderness or deformity. Normal range of motion.     Cervical back: Normal range of motion.  Lymphadenopathy:     Cervical: No cervical adenopathy.  Skin:    General: Skin is warm and dry.     Findings: No erythema or rash.  Neurological:     Mental Status: Daniel Shepherd is alert and oriented to person, place, and  time.  Psychiatric:        Behavior: Behavior normal.        Thought Content: Thought content normal.        Judgment: Judgment normal.      Lab Results  Component Value Date   WBC 5.0 05/02/2020   HGB 13.9 05/02/2020   HCT 41.1 05/02/2020   MCV 93.4 05/02/2020   PLT 196 05/02/2020     Chemistry      Component Value Date/Time   NA 138 05/02/2020 1358   K 4.1 05/02/2020 1358   CL 102 05/02/2020 1358   CO2 30 05/02/2020 1358   BUN 12 05/02/2020 1358   CREATININE 1.05 05/02/2020 1358      Component Value Date/Time   CALCIUM 9.5 05/02/2020 1358   ALKPHOS 65 05/02/2020 1358   AST 18 05/02/2020 1358   ALT 13 05/02/2020 1358   BILITOT 0.6 05/02/2020 1358      Impression and Plan: Daniel Shepherd is a 25 year old white male with metastatic embryonal cell carcinoma.  Daniel Shepherd had a left orchiectomy.  This was performed back on July 03, 2017.  A week later, Daniel Shepherd showed up in the emergency room with chest discomfort and some shortness of breath.  Daniel Shepherd had innumerable pulmonary nodules.  Daniel Shepherd was treated with systemic chemotherapy.  Daniel Shepherd had a complete response.  For now, we will see what the CT scan shows.  Because of the nature of his disease, I think we have to do scans for 5 years.  I just would feel more confident doing scans for 5 years.  We will have to see about getting scans set up on him when Daniel Shepherd comes back in 6 months.  We will do scans in 1 week.    [name], MD 5/23/20223:48 PM

## 2020-11-13 ENCOUNTER — Ambulatory Visit (HOSPITAL_BASED_OUTPATIENT_CLINIC_OR_DEPARTMENT_OTHER)
Admission: RE | Admit: 2020-11-13 | Discharge: 2020-11-13 | Disposition: A | Discharge status: Home / Self Care | Payer: Managed Care, Other (non HMO) | Source: Ambulatory Visit | Attending: Hematology & Oncology | Admitting: Hematology & Oncology

## 2020-11-13 ENCOUNTER — Telehealth: Payer: Self-pay | Admitting: *Deleted

## 2020-11-13 ENCOUNTER — Other Ambulatory Visit: Payer: Self-pay

## 2020-11-13 ENCOUNTER — Inpatient Hospital Stay: Payer: Managed Care, Other (non HMO)

## 2020-11-13 DIAGNOSIS — C6292 Malignant neoplasm of left testis, unspecified whether descended or undescended: Secondary | ICD-10-CM

## 2020-11-13 LAB — CMP (CANCER CENTER ONLY)
ALT: 14 U/L (ref 0–44)
AST: 15 U/L (ref 15–41)
Albumin: 4.6 g/dL (ref 3.5–5.0)
Alkaline Phosphatase: 71 U/L (ref 38–126)
Anion gap: 8 (ref 5–15)
BUN: 13 mg/dL (ref 6–20)
CO2: 27 mmol/L (ref 22–32)
Calcium: 9.8 mg/dL (ref 8.9–10.3)
Chloride: 102 mmol/L (ref 98–111)
Creatinine: 0.9 mg/dL (ref 0.61–1.24)
GFR, Estimated: >60 mL/min (ref >60)
Glucose, Bld: 96 mg/dL (ref 70–99)
Potassium: 4.3 mmol/L (ref 3.5–5.1)
Sodium: 137 mmol/L (ref 135–145)
Total Bilirubin: 0.4 mg/dL (ref 0.3–1.2)
Total Protein: 7.2 g/dL (ref 6.5–8.1)

## 2020-11-13 LAB — CBC WITH DIFFERENTIAL (CANCER CENTER ONLY)
Abs Immature Granulocytes: 0.02 10*3/uL (ref 0.00–0.07)
Basophils Absolute: 0.1 10*3/uL (ref 0.0–0.1)
Basophils Relative: 1 %
Eosinophils Absolute: 0.2 10*3/uL (ref 0.0–0.5)
Eosinophils Relative: 3 %
HCT: 41.5 % (ref 39.0–52.0)
Hemoglobin: 14.6 g/dL (ref 13.0–17.0)
Immature Granulocytes: 0 %
Lymphocytes Relative: 35 %
Lymphs Abs: 1.9 10*3/uL (ref 0.7–4.0)
MCH: 31.5 pg (ref 26.0–34.0)
MCHC: 35.2 g/dL (ref 30.0–36.0)
MCV: 89.6 fL (ref 80.0–100.0)
Monocytes Absolute: 0.4 10*3/uL (ref 0.1–1.0)
Monocytes Relative: 7 %
Neutro Abs: 2.8 10*3/uL (ref 1.7–7.7)
Neutrophils Relative %: 54 %
Platelet Count: 213 10*3/uL (ref 150–400)
RBC: 4.63 MIL/uL (ref 4.22–5.81)
RDW: 10.8 % — ABNORMAL LOW (ref 11.5–15.5)
WBC Count: 5.3 10*3/uL (ref 4.0–10.5)
nRBC: 0 % (ref 0.0–0.2)

## 2020-11-13 LAB — LACTATE DEHYDROGENASE: LDH: 145 U/L (ref 98–192)

## 2020-11-13 NOTE — Telephone Encounter (Signed)
-----   Message from [name], MD sent at 11/13/2020  1:19 PM EDT ----- Please call him and tell him that the CAT scan looks perfect.  There is no evidence of cancer.  Thanks.  Laurey Arrow

## 2020-11-13 NOTE — Telephone Encounter (Signed)
Patient notified per order of Dr. Marin Olp that "the CT scan looks perfect.  There is no evidence of cancer."  Pt appreciative of call and has no questions or concerns at this time.

## 2020-11-14 LAB — BETA HCG QUANT (REF LAB): hCG Quant: <1 m[IU]/mL (ref 0–3)

## 2020-11-14 LAB — AFP TUMOR MARKER: AFP, Serum, Tumor Marker: 4.4 ng/mL (ref 0.0–5.7)

## 2021-04-29 ENCOUNTER — Inpatient Hospital Stay: Payer: Managed Care, Other (non HMO) | Admitting: Hematology & Oncology

## 2021-04-29 ENCOUNTER — Inpatient Hospital Stay: Payer: Managed Care, Other (non HMO)

## 2021-06-03 ENCOUNTER — Other Ambulatory Visit: Payer: Self-pay | Admitting: *Deleted

## 2021-06-03 DIAGNOSIS — C6292 Malignant neoplasm of left testis, unspecified whether descended or undescended: Secondary | ICD-10-CM

## 2021-06-04 ENCOUNTER — Inpatient Hospital Stay: Payer: Managed Care, Other (non HMO) | Attending: Hematology & Oncology

## 2021-06-04 ENCOUNTER — Encounter: Payer: Self-pay | Admitting: Hematology & Oncology

## 2021-06-04 ENCOUNTER — Inpatient Hospital Stay (HOSPITAL_BASED_OUTPATIENT_CLINIC_OR_DEPARTMENT_OTHER): Payer: Managed Care, Other (non HMO) | Admitting: Hematology & Oncology

## 2021-06-04 ENCOUNTER — Other Ambulatory Visit: Payer: Self-pay

## 2021-06-04 VITALS — BP 122/87 | HR 88 | Temp 98.7°F | Resp 18 | Wt 214.0 lb

## 2021-06-04 DIAGNOSIS — C6292 Malignant neoplasm of left testis, unspecified whether descended or undescended: Secondary | ICD-10-CM | POA: Diagnosis present

## 2021-06-04 DIAGNOSIS — C7800 Secondary malignant neoplasm of unspecified lung: Secondary | ICD-10-CM | POA: Diagnosis not present

## 2021-06-04 LAB — CBC WITH DIFFERENTIAL (CANCER CENTER ONLY)
Abs Immature Granulocytes: 0.03 10*3/uL (ref 0.00–0.07)
Basophils Absolute: 0.1 10*3/uL (ref 0.0–0.1)
Basophils Relative: 1 %
Eosinophils Absolute: 0.1 10*3/uL (ref 0.0–0.5)
Eosinophils Relative: 2 %
HCT: 43.3 % (ref 39.0–52.0)
Hemoglobin: 15.2 g/dL (ref 13.0–17.0)
Immature Granulocytes: 1 %
Lymphocytes Relative: 26 %
Lymphs Abs: 1.4 10*3/uL (ref 0.7–4.0)
MCH: 31.8 pg (ref 26.0–34.0)
MCHC: 35.1 g/dL (ref 30.0–36.0)
MCV: 90.6 fL (ref 80.0–100.0)
Monocytes Absolute: 0.5 10*3/uL (ref 0.1–1.0)
Monocytes Relative: 8 %
Neutro Abs: 3.4 10*3/uL (ref 1.7–7.7)
Neutrophils Relative %: 62 %
Platelet Count: 229 10*3/uL (ref 150–400)
RBC: 4.78 MIL/uL (ref 4.22–5.81)
RDW: 11.1 % — ABNORMAL LOW (ref 11.5–15.5)
WBC Count: 5.4 10*3/uL (ref 4.0–10.5)
nRBC: 0 % (ref 0.0–0.2)

## 2021-06-04 LAB — COMPREHENSIVE METABOLIC PANEL
ALT: 18 U/L (ref 0–44)
AST: 21 U/L (ref 15–41)
Albumin: 5 g/dL (ref 3.5–5.0)
Alkaline Phosphatase: 71 U/L (ref 38–126)
Anion gap: 8 (ref 5–15)
BUN: 16 mg/dL (ref 6–20)
CO2: 29 mmol/L (ref 22–32)
Calcium: 10.3 mg/dL (ref 8.9–10.3)
Chloride: 101 mmol/L (ref 98–111)
Creatinine, Ser: 1.32 mg/dL — ABNORMAL HIGH (ref 0.61–1.24)
GFR, Estimated: >60 mL/min (ref >60)
Glucose, Bld: 98 mg/dL (ref 70–99)
Potassium: 4.2 mmol/L (ref 3.5–5.1)
Sodium: 138 mmol/L (ref 135–145)
Total Bilirubin: 0.6 mg/dL (ref 0.3–1.2)
Total Protein: 7.7 g/dL (ref 6.5–8.1)

## 2021-06-04 LAB — LACTATE DEHYDROGENASE: LDH: 205 U/L — ABNORMAL HIGH (ref 98–192)

## 2021-06-04 NOTE — Progress Notes (Signed)
Hematology and Oncology Follow Up Visit  Daniel Shepherd [id number] 29-Dec-1995 25 y.o. 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  He is doing quite well.  We last saw him back in the summertime.  He had a very nice summer.  He has been quite busy.  He has had no problems with nausea or vomiting.  He has had no issues with cough or shortness of breath.  He did have COVID back in August.  He said he was sick for a day or so.  His last tumor markers that we did back in May showed an alpha-fetoprotein of 4.4 and a beta hCG of less than 1.    He had CT scans done last time that we saw him.  The CT scan not show any evidence of recurrent disease.  There is been no problems with rashes.  He has had no leg swelling.  Overall, I would say that his performance status right now is ECOG 0.     Medications:  Current Outpatient Medications:    Creatine Monohydrate POWD, Take by mouth daily., Disp: , Rfl:    Multiple Vitamin (MULTIVITAMIN) tablet, Take 1 tablet by mouth daily., Disp: , Rfl:    OVER THE COUNTER MEDICATION, Testosterone booster caspule- 2 capsules daily., Disp: , Rfl:    co-enzyme Q-10 30 MG capsule, Take 30 mg by mouth daily., Disp: , Rfl:    EPINEPHrine 0.3 mg/0.3 mL IJ SOAJ injection, Inject into the skin. (Patient not taking: Reported on 11/05/2020), Disp: , Rfl:  No current facility-administered medications for this visit.  Facility-Administered Medications Ordered in Other Visits:    pegfilgrastim (NEULASTA ONPRO KIT) injection 6 mg, 6 mg, Subcutaneous, Once, Celso Amy, NP  Allergies:  Allergies  Allergen Reactions   Other Swelling    Bee allergy    Past Medical History, Surgical history, Social history, and Family History were reviewed and  updated.  Review of Systems: Review of Systems  Constitutional:  Positive for fatigue.  HENT:  Negative.    Eyes: Negative.   Respiratory:  Positive for shortness of breath.   Cardiovascular: Negative.   Gastrointestinal:  Positive for nausea.  Endocrine: Negative.   Genitourinary: Negative.    Musculoskeletal: Negative.   Skin: Negative.   Neurological: Negative.   Hematological: Negative.   Psychiatric/Behavioral: Negative.     Physical Exam:  weight is 214 lb (97.1 kg). His oral temperature is 98.7 F (37.1 C). His blood pressure is 122/87 and his pulse is 88. His respiration is 18 and oxygen saturation is 99%.   Wt Readings from Last 3 Encounters:  06/04/21 214 lb (97.1 kg)  11/05/20 221 lb 1.3 oz (100.3 kg)  05/02/20 200 lb 1.3 oz (90.8 kg)    Physical Exam Vitals reviewed.  HENT:     Head: Normocephalic and atraumatic.  Eyes:     Pupils: Pupils are equal, round, and reactive to light.  Cardiovascular:     Rate and Rhythm: Normal rate and regular rhythm.     Heart sounds: Normal heart sounds.  Pulmonary:     Effort: Pulmonary effort is normal.     Breath sounds: Normal breath sounds.  Abdominal:     General: Bowel sounds are normal.     Palpations: Abdomen is  Comments: Has a well-healed left inguinal orchiectomy scar.  There is no swelling.  There is no erythema.  There is no tenderness.  °Musculoskeletal:     °   General: No tenderness or deformity. Normal range of motion.  °   Cervical back: Normal range of motion.  °Lymphadenopathy:  °   Cervical: No cervical adenopathy.  °Skin: °   General: Skin is warm and dry.  °   Findings: No erythema or rash.  °Neurological:  °   Mental Status: He is alert and oriented to person, place, and time.  °Psychiatric:     °   Behavior: Behavior normal.     °   Thought Content: Thought content normal.     °   Judgment: Judgment normal.  ° ° ° °Lab Results  °Component Value Date  ° WBC 5.4 06/04/2021  ° HGB 15.2 06/04/2021  °  HCT 43.3 06/04/2021  ° MCV 90.6 06/04/2021  ° PLT 229 06/04/2021  ° °  Chemistry   °   °Component Value Date/Time  ° NA 137 11/13/2020 0949  ° K 4.3 11/13/2020 0949  ° CL 102 11/13/2020 0949  ° CO2 27 11/13/2020 0949  ° BUN 13 11/13/2020 0949  ° CREATININE 0.90 11/13/2020 0949  °    °Component Value Date/Time  ° CALCIUM 9.8 11/13/2020 0949  ° ALKPHOS 71 11/13/2020 0949  ° AST 15 11/13/2020 0949  ° ALT 14 11/13/2020 0949  ° BILITOT 0.4 11/13/2020 0949  °  ° ° °Impression and Plan: °Daniel Shepherd is a 25-year-old white male with metastatic embryonal cell carcinoma.  He had a left orchiectomy.  This was performed back on July 03, 2017.  A week later, he showed up in the emergency room with chest discomfort and some shortness of breath.  He had innumerable pulmonary nodules.  He was treated with systemic chemotherapy.  He had a complete response. ° °I really had believe that he is cured.  His tumor markers of all look fantastic. ° °We will go ahead with a final CT scan on him.  I think that after this scan, we can probably hold off on further scans. ° °We will plan to get him back in 6 months.  I think this would be reasonable. ° ° °[name], MD °12/20/202210:14 AM °

## 2021-06-05 ENCOUNTER — Telehealth: Payer: Self-pay

## 2021-06-05 LAB — BETA HCG QUANT (REF LAB): hCG Quant: <1 m[IU]/mL (ref 0–3)

## 2021-06-05 LAB — AFP TUMOR MARKER: AFP, Serum, Tumor Marker: 4.6 ng/mL (ref 0.0–5.7)

## 2021-06-05 NOTE — Telephone Encounter (Signed)
-----   Message from [name], MD sent at 06/05/2021 10:27 AM EST ----- Call - the tumor markers are normal!!!  Merry Christmas!!  Laurey Arrow

## 2021-06-05 NOTE — Telephone Encounter (Signed)
Called and informed patient's wife (ok per DPR) of lab results, patient verbalized understanding and denies any questions or concerns at this time.

## 2021-06-06 ENCOUNTER — Ambulatory Visit (HOSPITAL_BASED_OUTPATIENT_CLINIC_OR_DEPARTMENT_OTHER)
Admission: RE | Admit: 2021-06-06 | Discharge: 2021-06-06 | Disposition: A | Discharge status: Home / Self Care | Payer: Managed Care, Other (non HMO) | Source: Ambulatory Visit | Attending: Hematology & Oncology | Admitting: Hematology & Oncology

## 2021-06-06 ENCOUNTER — Other Ambulatory Visit: Payer: Self-pay

## 2021-06-06 ENCOUNTER — Encounter (HOSPITAL_BASED_OUTPATIENT_CLINIC_OR_DEPARTMENT_OTHER): Payer: Self-pay

## 2021-06-06 DIAGNOSIS — C6292 Malignant neoplasm of left testis, unspecified whether descended or undescended: Secondary | ICD-10-CM | POA: Diagnosis not present

## 2021-06-06 MED ORDER — IOHEXOL 300 MG/ML  SOLN
100.0000 mL | Freq: Once | INTRAMUSCULAR | Status: AC | PRN
  Administered 2021-06-06: 12:00:00 100 mL via INTRAVENOUS

## 2021-06-07 ENCOUNTER — Telehealth: Payer: Self-pay | Admitting: *Deleted

## 2021-06-07 NOTE — Telephone Encounter (Signed)
-----   Message from [name], MD sent at 06/06/2021  5:03 PM EST ----- Please call and let him know that the CT scan is negative for any obvious cancer.  This is fantastic news and a great way to end the year.  Laurey Arrow

## 2021-06-07 NOTE — Telephone Encounter (Signed)
Patient notified per order of Dr. Marin Olp "that the CT scan is negative for any obvious cancer.  This is fantastic news and a great way to end the year. Daniel Shepherd"  Pt appreciative of call and has no questions or concerns at this time.

## 2021-06-30 ENCOUNTER — Encounter: Payer: Self-pay | Admitting: Hematology & Oncology

## 2021-11-21 ENCOUNTER — Inpatient Hospital Stay: Payer: Managed Care, Other (non HMO) | Admitting: Hematology & Oncology

## 2021-11-21 ENCOUNTER — Inpatient Hospital Stay: Payer: Managed Care, Other (non HMO)

## 2021-12-11 ENCOUNTER — Inpatient Hospital Stay (HOSPITAL_BASED_OUTPATIENT_CLINIC_OR_DEPARTMENT_OTHER): Payer: Managed Care, Other (non HMO) | Admitting: Hematology & Oncology

## 2021-12-11 ENCOUNTER — Inpatient Hospital Stay: Payer: Managed Care, Other (non HMO) | Attending: Hematology & Oncology

## 2021-12-11 ENCOUNTER — Encounter: Payer: Self-pay | Admitting: Hematology & Oncology

## 2021-12-11 ENCOUNTER — Other Ambulatory Visit: Payer: Self-pay

## 2021-12-11 VITALS — BP 132/81 | HR 61 | Temp 98.2°F | Resp 18 | Wt 228.0 lb

## 2021-12-11 DIAGNOSIS — C6292 Malignant neoplasm of left testis, unspecified whether descended or undescended: Secondary | ICD-10-CM | POA: Diagnosis not present

## 2021-12-11 DIAGNOSIS — Z79899 Other long term (current) drug therapy: Secondary | ICD-10-CM | POA: Diagnosis not present

## 2021-12-11 DIAGNOSIS — Z91030 Bee allergy status: Secondary | ICD-10-CM | POA: Diagnosis not present

## 2021-12-11 DIAGNOSIS — R0789 Other chest pain: Secondary | ICD-10-CM | POA: Insufficient documentation

## 2021-12-11 DIAGNOSIS — Z79890 Hormone replacement therapy (postmenopausal): Secondary | ICD-10-CM | POA: Diagnosis not present

## 2021-12-11 DIAGNOSIS — R110 Nausea: Secondary | ICD-10-CM | POA: Diagnosis not present

## 2021-12-11 DIAGNOSIS — R0602 Shortness of breath: Secondary | ICD-10-CM | POA: Diagnosis not present

## 2021-12-11 DIAGNOSIS — C7800 Secondary malignant neoplasm of unspecified lung: Secondary | ICD-10-CM | POA: Insufficient documentation

## 2021-12-11 DIAGNOSIS — R5383 Other fatigue: Secondary | ICD-10-CM | POA: Diagnosis not present

## 2021-12-11 LAB — CBC WITH DIFFERENTIAL (CANCER CENTER ONLY)
Abs Immature Granulocytes: 0.01 10*3/uL (ref 0.00–0.07)
Basophils Absolute: 0.1 10*3/uL (ref 0.0–0.1)
Basophils Relative: 1 %
Eosinophils Absolute: 0.3 10*3/uL (ref 0.0–0.5)
Eosinophils Relative: 6 %
HCT: 46.6 % (ref 39.0–52.0)
Hemoglobin: 15.7 g/dL (ref 13.0–17.0)
Immature Granulocytes: 0 %
Lymphocytes Relative: 28 %
Lymphs Abs: 1.6 10*3/uL (ref 0.7–4.0)
MCH: 31.5 pg (ref 26.0–34.0)
MCHC: 33.7 g/dL (ref 30.0–36.0)
MCV: 93.4 fL (ref 80.0–100.0)
Monocytes Absolute: 0.4 10*3/uL (ref 0.1–1.0)
Monocytes Relative: 7 %
Neutro Abs: 3.3 10*3/uL (ref 1.7–7.7)
Neutrophils Relative %: 58 %
Platelet Count: 207 10*3/uL (ref 150–400)
RBC: 4.99 MIL/uL (ref 4.22–5.81)
RDW: 11.3 % — ABNORMAL LOW (ref 11.5–15.5)
WBC Count: 5.6 10*3/uL (ref 4.0–10.5)
nRBC: 0 % (ref 0.0–0.2)

## 2021-12-11 LAB — CMP (CANCER CENTER ONLY)
ALT: 21 U/L (ref 0–44)
AST: 22 U/L (ref 15–41)
Albumin: 4.7 g/dL (ref 3.5–5.0)
Alkaline Phosphatase: 67 U/L (ref 38–126)
Anion gap: 8 (ref 5–15)
BUN: 19 mg/dL (ref 6–20)
CO2: 29 mmol/L (ref 22–32)
Calcium: 9.9 mg/dL (ref 8.9–10.3)
Chloride: 104 mmol/L (ref 98–111)
Creatinine: 1.08 mg/dL (ref 0.61–1.24)
GFR, Estimated: >60 mL/min (ref >60)
Glucose, Bld: 90 mg/dL (ref 70–99)
Potassium: 4.5 mmol/L (ref 3.5–5.1)
Sodium: 141 mmol/L (ref 135–145)
Total Bilirubin: 0.6 mg/dL (ref 0.3–1.2)
Total Protein: 7.7 g/dL (ref 6.5–8.1)

## 2021-12-11 LAB — LACTATE DEHYDROGENASE: LDH: 162 U/L (ref 98–192)

## 2021-12-11 NOTE — Progress Notes (Signed)
Hematology and Oncology Follow Up Visit  Daniel Shepherd [id number] 07/01/1995 26 y.o. 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  We see him every 6 months.  We do our scans every 12 months.  His last set of scans was done back in December.  Everything looked fine on the scans.  When we last saw him, his beta-hCG was less than 1.  The alpha-fetoprotein was 4.6.  He is still working quite a bit.  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  He does work up in [place], [place] right now.    He has had no problems with cough.  There is no fatigue or weakness.  He did have a COVID back in August of last year.  Everything seems to be doing well with this with no long-term side effects.  He has had no problems with rashes.  He has had no change in bowel or bladder habits.  There is been no cough or shortness of breath.  He has had no bleeding.  He has had no headache.  Overall, I was his performance status is ECOG 0.  .     Medications:  Current Outpatient Medications:    co-enzyme Q-10 30 MG capsule, Take 30 mg by mouth daily., Disp: , Rfl:    Creatine Monohydrate POWD, Take by mouth daily., Disp: , Rfl:    EPINEPHrine 0.3 mg/0.3 mL IJ SOAJ injection, Inject into the skin. (Patient not taking: Reported on 11/05/2020), Disp: , Rfl:    Multiple Vitamin (MULTIVITAMIN) tablet, Take 1 tablet by mouth daily., Disp: , Rfl:    OVER THE COUNTER MEDICATION, Testosterone booster caspule- 2 capsules daily., Disp: , Rfl:  No current facility-administered medications for this visit.  Facility-Administered Medications Ordered in Other Visits:    pegfilgrastim (NEULASTA ONPRO KIT) injection 6 mg, 6 mg, Subcutaneous, Once, Celso Amy, NP  Allergies:  Allergies  Allergen  Reactions   Other Swelling    Bee allergy    Past Medical History, Surgical history, Social history, and Family History were reviewed and updated.  Review of Systems: Review of Systems  Constitutional:  Positive for fatigue.  HENT:  Negative.    Eyes: Negative.   Respiratory:  Positive for shortness of breath.   Cardiovascular: Negative.   Gastrointestinal:  Positive for nausea.  Endocrine: Negative.   Genitourinary: Negative.    Musculoskeletal: Negative.   Skin: Negative.   Neurological: Negative.   Hematological: Negative.   Psychiatric/Behavioral: Negative.      Physical Exam:  weight is 228 lb (103.4 kg). His oral temperature is 98.2 F (36.8 C). His blood pressure is 132/81 and his pulse is 61. His respiration is 18 and oxygen saturation is 100%.   Wt Readings from Last 3 Encounters:  12/11/21 228 lb (103.4 kg)  06/04/21 214 lb (97.1 kg)  11/05/20 221 lb 1.3 oz (100.3 kg)    Physical Exam Vitals reviewed.  HENT:     Head: Normocephalic and atraumatic.  Eyes:     Pupils: Pupils are equal, round, and reactive to light.  Cardiovascular:     Rate and Rhythm: Normal rate and regular rhythm.     Heart sounds: Normal heart sounds.  Pulmonary:     Effort: Pulmonary effort is normal.  Breath sounds: Normal breath sounds.  Abdominal:     General: Bowel sounds are normal.     Palpations: Abdomen is soft.     Comments: Has a well-healed left inguinal orchiectomy scar.  There is no swelling.  There is no erythema.  There is no tenderness.  Musculoskeletal:        General: No tenderness or deformity. Normal range of motion.     Cervical back: Normal range of motion.  Lymphadenopathy:     Cervical: No cervical adenopathy.  Skin:    General: Skin is warm and dry.     Findings: No erythema or rash.  Neurological:     Mental Status: He is alert and oriented to person, place, and time.  Psychiatric:        Behavior: Behavior normal.        Thought Content: Thought  content normal.        Judgment: Judgment normal.     Lab Results  Component Value Date   WBC 5.6 12/11/2021   HGB 15.7 12/11/2021   HCT 46.6 12/11/2021   MCV 93.4 12/11/2021   PLT 207 12/11/2021     Chemistry      Component Value Date/Time   NA 138 06/04/2021 0955   K 4.2 06/04/2021 0955   CL 101 06/04/2021 0955   CO2 29 06/04/2021 0955   BUN 16 06/04/2021 0955   CREATININE 1.32 (H) 06/04/2021 0955   CREATININE 0.90 11/13/2020 0949      Component Value Date/Time   CALCIUM 10.3 06/04/2021 0955   ALKPHOS 71 06/04/2021 0955   AST 21 06/04/2021 0955   AST 15 11/13/2020 0949   ALT 18 06/04/2021 0955   ALT 14 11/13/2020 0949   BILITOT 0.6 06/04/2021 0955   BILITOT 0.4 11/13/2020 0949      Impression and Plan: [name] is a 26 year old white male with metastatic embryonal cell carcinoma.  He had a left orchiectomy.  This was performed back on July 03, 2017.  A week later, he showed up in the emergency room with chest discomfort and some shortness of breath.  He had innumerable pulmonary nodules.  He was treated with systemic chemotherapy.  He had a complete response.  We will plan to get him back in 6 more months.  We do his scans every 12 months.  I really think that the risk of his recurrence is probably going to be less than 5% at this point.  However, there is that risk for patients who have had testicular cancer.  He probably does need to have his cholesterol checked.   [name], MD 6/28/20238:33 AM

## 2021-12-13 ENCOUNTER — Encounter: Payer: Self-pay | Admitting: *Deleted

## 2021-12-13 LAB — BETA HCG QUANT (REF LAB): hCG Quant: <1 m[IU]/mL (ref 0–3)

## 2021-12-13 LAB — AFP TUMOR MARKER: AFP, Serum, Tumor Marker: 4.9 ng/mL (ref 0.0–5.7)

## 2022-06-04 ENCOUNTER — Inpatient Hospital Stay: Payer: Managed Care, Other (non HMO)

## 2022-06-04 ENCOUNTER — Inpatient Hospital Stay: Payer: Managed Care, Other (non HMO) | Admitting: Hematology & Oncology

## 2022-06-04 ENCOUNTER — Ambulatory Visit (HOSPITAL_BASED_OUTPATIENT_CLINIC_OR_DEPARTMENT_OTHER): Admission: RE | Admit: 2022-06-04 | Payer: Managed Care, Other (non HMO) | Source: Ambulatory Visit

## 2022-06-20 ENCOUNTER — Ambulatory Visit (HOSPITAL_BASED_OUTPATIENT_CLINIC_OR_DEPARTMENT_OTHER)
Admission: RE | Admit: 2022-06-20 | Discharge: 2022-06-20 | Disposition: A | Discharge status: Home / Self Care | Payer: Managed Care, Other (non HMO) | Source: Ambulatory Visit | Attending: Hematology & Oncology | Admitting: Hematology & Oncology

## 2022-06-20 DIAGNOSIS — C6292 Malignant neoplasm of left testis, unspecified whether descended or undescended: Secondary | ICD-10-CM | POA: Diagnosis not present

## 2022-06-20 MED ORDER — IOHEXOL 300 MG/ML  SOLN
80.0000 mL | Freq: Once | INTRAMUSCULAR | Status: AC | PRN
  Administered 2022-06-20: 80 mL via INTRAVENOUS

## 2022-06-23 ENCOUNTER — Encounter: Payer: Self-pay | Admitting: Hematology & Oncology

## 2022-06-23 ENCOUNTER — Inpatient Hospital Stay: Payer: Managed Care, Other (non HMO) | Attending: Hematology & Oncology

## 2022-06-23 ENCOUNTER — Inpatient Hospital Stay (HOSPITAL_BASED_OUTPATIENT_CLINIC_OR_DEPARTMENT_OTHER): Payer: Managed Care, Other (non HMO) | Admitting: Hematology & Oncology

## 2022-06-23 ENCOUNTER — Other Ambulatory Visit: Payer: Self-pay

## 2022-06-23 VITALS — BP 132/71 | HR 65 | Temp 98.3°F | Resp 17 | Wt 210.0 lb

## 2022-06-23 DIAGNOSIS — C6292 Malignant neoplasm of left testis, unspecified whether descended or undescended: Secondary | ICD-10-CM

## 2022-06-23 DIAGNOSIS — Z8547 Personal history of malignant neoplasm of testis: Secondary | ICD-10-CM | POA: Insufficient documentation

## 2022-06-23 DIAGNOSIS — Z9221 Personal history of antineoplastic chemotherapy: Secondary | ICD-10-CM | POA: Diagnosis not present

## 2022-06-23 LAB — CBC WITH DIFFERENTIAL (CANCER CENTER ONLY)
Abs Immature Granulocytes: 0.01 10*3/uL (ref 0.00–0.07)
Basophils Absolute: 0 10*3/uL (ref 0.0–0.1)
Basophils Relative: 1 %
Eosinophils Absolute: 0.2 10*3/uL (ref 0.0–0.5)
Eosinophils Relative: 3 %
HCT: 41.3 % (ref 39.0–52.0)
Hemoglobin: 14.1 g/dL (ref 13.0–17.0)
Immature Granulocytes: 0 %
Lymphocytes Relative: 26 %
Lymphs Abs: 1.2 10*3/uL (ref 0.7–4.0)
MCH: 31.5 pg (ref 26.0–34.0)
MCHC: 34.1 g/dL (ref 30.0–36.0)
MCV: 92.4 fL (ref 80.0–100.0)
Monocytes Absolute: 0.4 10*3/uL (ref 0.1–1.0)
Monocytes Relative: 8 %
Neutro Abs: 2.9 10*3/uL (ref 1.7–7.7)
Neutrophils Relative %: 62 %
Platelet Count: 233 10*3/uL (ref 150–400)
RBC: 4.47 MIL/uL (ref 4.22–5.81)
RDW: 11.5 % (ref 11.5–15.5)
WBC Count: 4.7 10*3/uL (ref 4.0–10.5)
nRBC: 0 % (ref 0.0–0.2)

## 2022-06-23 LAB — CMP (CANCER CENTER ONLY)
ALT: 20 U/L (ref 0–44)
AST: 30 U/L (ref 15–41)
Albumin: 4.8 g/dL (ref 3.5–5.0)
Alkaline Phosphatase: 70 U/L (ref 38–126)
Anion gap: 8 (ref 5–15)
BUN: 17 mg/dL (ref 6–20)
CO2: 28 mmol/L (ref 22–32)
Calcium: 9.4 mg/dL (ref 8.9–10.3)
Chloride: 103 mmol/L (ref 98–111)
Creatinine: 1.12 mg/dL (ref 0.61–1.24)
GFR, Estimated: >60 mL/min (ref >60)
Glucose, Bld: 105 mg/dL — ABNORMAL HIGH (ref 70–99)
Potassium: 4 mmol/L (ref 3.5–5.1)
Sodium: 139 mmol/L (ref 135–145)
Total Bilirubin: 1 mg/dL (ref 0.3–1.2)
Total Protein: 7.6 g/dL (ref 6.5–8.1)

## 2022-06-23 LAB — LIPID PANEL
Cholesterol: 153 mg/dL (ref 0–200)
HDL: 39 mg/dL — ABNORMAL LOW (ref >40)
LDL Cholesterol: 102 mg/dL — ABNORMAL HIGH (ref 0–99)
Total CHOL/HDL Ratio: 3.9 RATIO
Triglycerides: 59 mg/dL (ref <150)
VLDL: 12 mg/dL (ref 0–40)

## 2022-06-23 LAB — LACTATE DEHYDROGENASE: LDH: 231 U/L — ABNORMAL HIGH (ref 98–192)

## 2022-06-23 NOTE — Progress Notes (Signed)
Hematology and Oncology Follow Up Visit  Jermichael Belmares [id number] April 27, 1996 27 y.o. 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 Zebrowski is  back for follow-up.  So far, everything is going incredibly well for him.  Is now been 5 years since he completed treatments.  We will now plan to let him go from the clinic.  He had a CT scan done on 06/20/2022.  This did not show any evidence of recurrent cancer.  His last tumor markers showed a beta-hCG of less than 1 and alpha-fetoprotein of 4.9.  He has had no problems with cough or shortness of breath.  He has had no nausea or vomiting.  He has had no change in bowel or bladder habits.  He has had no rashes.  There is been no leg swelling.  He has had no problems with COVID or Influenza.  Overall, his performance status is ECOG 0.       Medications:  Current Outpatient Medications:    co-enzyme Q-10 30 MG capsule, Take 30 mg by mouth daily., Disp: , Rfl:    Creatine Monohydrate POWD, Take by mouth daily., Disp: , Rfl:    EPINEPHrine 0.3 mg/0.3 mL IJ SOAJ injection, Inject into the skin. (Patient not taking: Reported on 11/05/2020), Disp: , Rfl:    Multiple Vitamin (MULTIVITAMIN) tablet, Take 1 tablet by mouth daily., Disp: , Rfl:    OVER THE COUNTER MEDICATION, Testosterone booster caspule- 2 capsules daily., Disp: , Rfl:  No current facility-administered medications for this visit.  Facility-Administered Medications Ordered in Other Visits:    pegfilgrastim (NEULASTA ONPRO KIT) injection 6 mg, 6 mg, Subcutaneous, Once, Celso Amy, NP  Allergies:  Allergies  Allergen Reactions   Other Swelling    Bee allergy    Past Medical History, Surgical history, Social history, and Family History were reviewed and updated.  Review of Systems: Review of Systems   Constitutional:  Positive for fatigue.  HENT:  Negative.    Eyes: Negative.   Respiratory:  Positive for shortness of breath.   Cardiovascular: Negative.   Gastrointestinal:  Positive for nausea.  Endocrine: Negative.   Genitourinary: Negative.    Musculoskeletal: Negative.   Skin: Negative.   Neurological: Negative.   Hematological: Negative.   Psychiatric/Behavioral: Negative.      Physical Exam:  weight is 210 lb (95.3 kg). His oral temperature is 98.3 F (36.8 C). His blood pressure is 132/71 and his pulse is 65. His respiration is 17 and oxygen saturation is 100%.   Wt Readings from Last 3 Encounters:  06/23/22 210 lb (95.3 kg)  12/11/21 228 lb (103.4 kg)  06/04/21 214 lb (97.1 kg)    Physical Exam Vitals reviewed.  HENT:     Head: Normocephalic and atraumatic.  Eyes:     Pupils: Pupils are equal, round, and reactive to light.  Cardiovascular:     Rate and Rhythm: Normal rate and regular rhythm.     Heart sounds: Normal heart sounds.  Pulmonary:     Effort: Pulmonary effort is normal.     Breath sounds: Normal breath sounds.  Abdominal:     General: Bowel sounds are normal.     Palpations: Abdomen is soft.     Comments: Has a well-healed left inguinal orchiectomy scar.  There is no swelling.  There  is no erythema.  There is no tenderness.  Musculoskeletal:        General: No tenderness or deformity. Normal range of motion.     Cervical back: Normal range of motion.  Lymphadenopathy:     Cervical: No cervical adenopathy.  Skin:    General: Skin is warm and dry.     Findings: No erythema or rash.  Neurological:     Mental Status: He is alert and oriented to person, place, and time.  Psychiatric:        Behavior: Behavior normal.        Thought Content: Thought content normal.        Judgment: Judgment normal.      Lab Results  Component Value Date   WBC 4.7 06/23/2022   HGB 14.1 06/23/2022   HCT 41.3 06/23/2022   MCV 92.4 06/23/2022   PLT 233  06/23/2022     Chemistry      Component Value Date/Time   NA 139 06/23/2022 0813   K 4.0 06/23/2022 0813   CL 103 06/23/2022 0813   CO2 28 06/23/2022 0813   BUN 17 06/23/2022 0813   CREATININE 1.12 06/23/2022 0813      Component Value Date/Time   CALCIUM 9.4 06/23/2022 0813   ALKPHOS 70 06/23/2022 0813   AST 30 06/23/2022 0813   ALT 20 06/23/2022 0813   BILITOT 1.0 06/23/2022 0813      Impression and Plan: Mr. Musselman is a 27 year old white male with metastatic embryonal cell carcinoma.  He had a left orchiectomy.  This was performed back on July 03, 2017.  A week later, he showed up in the emergency room with chest discomfort and some shortness of breath.  He had innumerable pulmonary nodules.  He was treated with systemic chemotherapy.  He had a complete response.  At this point, we will let him go from the practice.  I just do not think that his cancer is going to come back.  I realize that there is always a risk of late recurrence but I think this is can be less than 5%.  I told him that he can always come back if he has any problems down the road.  He is done incredibly well.  I am just very happy for him.    [name], MD 1/8/20249:12 AM

## 2022-06-24 LAB — AFP TUMOR MARKER: AFP, Serum, Tumor Marker: 5.1 ng/mL (ref 0.0–5.7)

## 2022-06-24 LAB — BETA HCG QUANT (REF LAB): hCG Quant: <1 m[IU]/mL (ref 0–3)

## 2022-10-24 ENCOUNTER — Encounter: Payer: Self-pay | Admitting: Hematology & Oncology

## 2022-10-24 ENCOUNTER — Other Ambulatory Visit: Payer: Self-pay | Admitting: *Deleted

## 2022-10-24 ENCOUNTER — Inpatient Hospital Stay: Payer: Managed Care, Other (non HMO) | Attending: Hematology & Oncology | Admitting: Hematology & Oncology

## 2022-10-24 ENCOUNTER — Other Ambulatory Visit: Payer: Self-pay

## 2022-10-24 VITALS — BP 137/72 | HR 62 | Temp 98.4°F | Resp 16 | Wt 209.0 lb

## 2022-10-24 DIAGNOSIS — C6292 Malignant neoplasm of left testis, unspecified whether descended or undescended: Secondary | ICD-10-CM | POA: Diagnosis not present

## 2022-10-24 DIAGNOSIS — C7800 Secondary malignant neoplasm of unspecified lung: Secondary | ICD-10-CM | POA: Insufficient documentation

## 2022-10-24 NOTE — Progress Notes (Signed)
Hematology and Oncology Follow Up Visit  Daniel Shepherd [id number] [birth year] 27 y.o. 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  Apparently, yesterday, he began to have a lot of pain in the right side of his scrotum.  There was maybe a little bit of swelling.  He denied any obvious pelvic trauma.  He does do a Sales promotion account executive work.  However, on the side, he does do  [employer] activities.  He does not recall any kind of trauma to the scrotal area.  He ultimately went to the ER down to [facility].  He did have a ultrasound that was done of the testicle.  This surprisingly, showed a solid and cystic mass with peripheral microcalcifications measuring 1.2 x 1.2 x 1.35 cm.  There is increased peripheral blood flow around this structure.  There is no other obvious abnormality.  He did have a CT of the chest abdomen and pelvis.  This did not show any obvious metastatic disease.  When we last saw him in January, he had a beta-hCG of less than 1 and alpha-fetoprotein of 5.1.  The LDH was 231.  He has had no cough.  He has had no nausea or vomiting.  Is been no change in bowel or bladder habits.  He has had no bleeding.  He has had no leg swelling.  He has had no fever.  Overall, I would say his performance status is ECOG 0.        Medications:  Current Outpatient Medications:    oxyCODONE (OXY IR/ROXICODONE) 5 MG immediate release tablet, Take 5 mg by mouth., Disp: , Rfl:    co-enzyme Q-10 30 MG capsule, Take 30 mg by mouth daily., Disp: , Rfl:    Creatine Monohydrate POWD, Take by mouth daily., Disp: , Rfl:    EPINEPHrine 0.3 mg/0.3 mL IJ SOAJ injection, Inject into the skin. (Patient not taking: Reported on 11/05/2020), Disp: , Rfl:    Multiple Vitamin (MULTIVITAMIN) tablet, Take 1  tablet by mouth daily., Disp: , Rfl:    OVER THE COUNTER MEDICATION, Testosterone booster caspule- 2 capsules daily., Disp: , Rfl:  No current facility-administered medications for this visit.  Facility-Administered Medications Ordered in Other Visits:    pegfilgrastim (NEULASTA ONPRO KIT) injection 6 mg, 6 mg, Subcutaneous, Once, Daniel Blank, NP  Allergies:  Allergies  Allergen Reactions   Other Swelling    Bee allergy    Past Medical History, Surgical history, Social history, and Family History were reviewed and updated.  Review of Systems: Review of Systems  Constitutional:  Positive for fatigue.  HENT:  Negative.    Eyes: Negative.   Respiratory:  Positive for shortness of breath.   Cardiovascular: Negative.   Gastrointestinal:  Positive for nausea.  Endocrine: Negative.   Genitourinary: Negative.    Musculoskeletal: Negative.   Skin: Negative.   Neurological: Negative.   Hematological: Negative.   Psychiatric/Behavioral: Negative.      Physical Exam:  weight is 209 lb (94.8 kg). His oral temperature is 98.4 F (36.9 C). His blood pressure is 137/72 and his pulse is 62. His respiration is 16 and oxygen saturation is 100%.   Wt Readings from Last 3 Encounters:  10/24/22 209 lb (94.8 kg)  06/23/22 210 lb (95.3 kg)  12/11/21 228 lb (103.4 kg)    Physical Exam Vitals reviewed.  HENT:     Head: Normocephalic and atraumatic.  Eyes:     Pupils: Pupils are equal, round, and reactive to light.  Cardiovascular:     Rate and Rhythm: Normal rate and regular rhythm.     Heart sounds: Normal heart sounds.  Pulmonary:     Effort: Pulmonary effort is normal.     Breath sounds: Normal breath sounds.  Abdominal:     General: Bowel sounds are normal.     Palpations: Abdomen is soft.     Comments: Has a well-healed left inguinal orchiectomy scar.  There is no swelling.  There is no erythema.  There is no tenderness.  Genitourinary:    Comments: Scrotal exam does show a  localized area of swelling.  This is in the upper portion of the scrotum.  It is slightly tender.  It probably measures about 1 x 1 cm.  The rest of the scrotum is unremarkable.  He is status post left orchiectomy.  There is no inguinal adenopathy in the right inguinal chain. Musculoskeletal:        General: No tenderness or deformity. Normal range of motion.     Cervical back: Normal range of motion.  Lymphadenopathy:     Cervical: No cervical adenopathy.  Skin:    General: Skin is warm and dry.     Findings: No erythema or rash.  Neurological:     Mental Status: He is alert and oriented to person, place, and time.  Psychiatric:        Behavior: Behavior normal.        Thought Content: Thought content normal.        Judgment: Judgment normal.      Lab Results  Component Value Date   WBC 4.7 06/23/2022   HGB 14.1 06/23/2022   HCT 41.3 06/23/2022   MCV 92.4 06/23/2022   PLT 233 06/23/2022     Chemistry      Component Value Date/Time   NA 139 06/23/2022 0813   K 4.0 06/23/2022 0813   CL 103 06/23/2022 0813   CO2 28 06/23/2022 0813   BUN 17 06/23/2022 0813   CREATININE 1.12 06/23/2022 0813      Component Value Date/Time   CALCIUM 9.4 06/23/2022 0813   ALKPHOS 70 06/23/2022 0813   AST 30 06/23/2022 0813   ALT 20 06/23/2022 0813   BILITOT 1.0 06/23/2022 0813      Impression and Plan: Daniel Shepherd is a 27 year old white male with metastatic embryonal cell carcinoma.  He had a left orchiectomy.  This was performed back on July 03, 2017.  A week later, he showed up in the emergency room with chest discomfort and some shortness of breath.  He had innumerable pulmonary nodules.  He was treated with systemic chemotherapy.  He had a complete response.  I am quite perplexed over this.  I must say that in all the time that I have done oncology, I cannot recall a male having a contralateral testicular cancer.  However, I know this certainly can occur.  This is clearly some that  we have to have Urology look at.  I will speak to the Urologist in our building.  They should be able to see him on Monday.  I really want this to be taken care of quickly.  Hopefully, he will not need to have an orchiectomy.  I do not know if this might be  epididymitis.  We will have to see what the Urologist have to say.  Otherwise, we will keep his appointment the same as scheduled previously.    Josph Macho, MD 5/10/20242:53 PM

## 2022-10-29 ENCOUNTER — Inpatient Hospital Stay: Payer: Managed Care, Other (non HMO) | Admitting: Hematology & Oncology

## 2022-10-29 ENCOUNTER — Encounter: Payer: Self-pay | Admitting: Urology

## 2022-10-29 ENCOUNTER — Ambulatory Visit (INDEPENDENT_AMBULATORY_CARE_PROVIDER_SITE_OTHER): Payer: Managed Care, Other (non HMO) | Admitting: Urology

## 2022-10-29 ENCOUNTER — Inpatient Hospital Stay: Payer: Managed Care, Other (non HMO)

## 2022-10-29 ENCOUNTER — Ambulatory Visit (HOSPITAL_BASED_OUTPATIENT_CLINIC_OR_DEPARTMENT_OTHER)
Admission: RE | Admit: 2022-10-29 | Discharge: 2022-10-29 | Disposition: A | Discharge status: Home / Self Care | Payer: Managed Care, Other (non HMO) | Source: Ambulatory Visit | Attending: Urology | Admitting: Urology

## 2022-10-29 VITALS — BP 162/76 | HR 76 | Ht 74.0 in | Wt 210.0 lb

## 2022-10-29 DIAGNOSIS — N5089 Other specified disorders of the male genital organs: Secondary | ICD-10-CM

## 2022-10-29 DIAGNOSIS — N50819 Testicular pain, unspecified: Secondary | ICD-10-CM | POA: Diagnosis not present

## 2022-10-29 LAB — URINALYSIS, ROUTINE W REFLEX MICROSCOPIC
Bilirubin, UA: NEGATIVE
Glucose, UA: NEGATIVE
Ketones, UA: NEGATIVE
Leukocytes,UA: NEGATIVE
Nitrite, UA: NEGATIVE
RBC, UA: NEGATIVE
Specific Gravity, UA: 1.03 (ref 1.005–1.030)
Urobilinogen, Ur: 0.2 mg/dL (ref 0.2–1.0)
pH, UA: 6 (ref 5.0–7.5)

## 2022-10-29 NOTE — Progress Notes (Signed)
Assessment: 1. Pain in testicle, unspecified laterality   2. Testis mass      Plan: Today I had a long discussion with the patient and his wife regarding my concerns that he has a second primary testis tumor and his remaining right testis.  As noted, patient is interested in further fertility. Referral made for sperm banking Today also discussed his case with Dr. Grandville Silos and urology at [facility] in [place] regarding possible partial orchiectomy.  He has agreed to work him in promptly for this possibility. Testicular tumor markers obtained today (AFP, hCG, LDH) will also obtain CBC and CMP.  Chief Complaint: testis mass  History of Present Illness:  Daniel Shepherd is a 27 y.o. male who is seen in consultation and urgent work in from Dr. Myna Hidalgo and medical oncology.  Patient has a history of nonseminomatous germ cell tumor Status post left radical orchiectomy 2019.  Pathology demonstrated a large 6.5 cm embryonal cell carcinoma (may have been a small component of Y ST).  Tumor was T3 with epididymal and cord involvement.  Patient had stage III disease with pulmonary mets and underwent chemotherapy with CR obtained.  He has remained NED on subsequent follow-up with normal markers and scans through January 2024.  The patient recently has noted some discomfort involving his remaining testis.  He underwent an ultrasound at [facility] that suggested a possible 1.3 cm mass in his remaining testis.  Patient also had a chest x-ray and a CT scan of the abdomen and pelvis at that time which was negative for metastatic disease. Patient reports that the discomfort he noticed a week or so ago has resolved.  He feels well.  He denies any other constitutional complaints.  He denies any abdominal pain, shortness of breath, or weight loss.  He presents here today for further evaluation.  Note.  Patient has 2 children but is interested in having more.   Repeat scrotal ultrasound done  today--- IMPRESSION: 1. Solid and cystic mass within the superior pole of the right testis measuring up to 1.9 cm. Findings are highly concerning for malignancy. 2. No evidence of right testicular torsion. 3. Status post left orchiectomy.  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   Scrotum pain    Testicular cancer [facility])     Past Surgical History:  Past Surgical History:  Procedure Laterality Date   HYDROCELE EXCISION Left 07/03/2017   Procedure: HYDROCELECTOMY ADULT/ INGUINAL APPROACH/ TESTICULAR BIOPSY/ ORCHIECTOMY;  Surgeon: Ihor Gully, MD;  Location: [facility];  Service: Urology;  Laterality: Left;   IR FLUORO GUIDE PORT INSERTION RIGHT  07/09/2017   IR REMOVAL TUN ACCESS W/ PORT W/O FL MOD SED  10/12/2017   IR US GUIDE VASC ACCESS RIGHT  07/09/2017   NO PAST SURGERIES     ORCHIECTOMY      Allergies:  Allergies  Allergen Reactions   Other Swelling    Bee allergy    Family History:  Family History  Problem Relation Age of Onset   Healthy Mother    Healthy Father    Colon cancer Other  Lynch syndrome   Breast cancer Maternal Great-grandmother     Social History:  Social History   Tobacco Use   Smoking status: Never   Smokeless tobacco: Never  Vaping Use   Vaping Use: Never used  Substance Use Topics   Alcohol use: Yes    Comment: seldom   Drug use: Yes    Types: Marijuana    Review of symptoms:  Constitutional:  Negative for unexplained weight loss, night sweats, fever, chills ENT:  Negative for nose bleeds, sinus pain, painful swallowing CV:  Negative for chest pain, shortness of  breath, exercise intolerance, palpitations, loss of consciousness Resp:  Negative for cough, wheezing, shortness of breath GI:  Negative for nausea, vomiting, diarrhea, bloody stools GU:  Positives noted in HPI; otherwise negative for gross hematuria, dysuria, urinary incontinence Neuro:  Negative for seizures, poor balance, limb weakness, slurred speech Psych:  Negative for lack of energy, depression, anxiety Endocrine:  Negative for polydipsia, polyuria, symptoms of hypoglycemia (dizziness, hunger, sweating) Hematologic:  Negative for anemia, purpura, petechia, prolonged or excessive bleeding, use of anticoagulants  Allergic:  Negative for difficulty breathing or choking as a result of exposure to anything; no shellfish allergy; no allergic response (rash/itch) to materials, foods  Physical exam: Vitals:   10/29/22 0851  BP: (!) 162/76  Pulse: 76    GENERAL APPEARANCE:  Well appearing, well developed, well nourished, NAD HEENT: Atraumatic, Normocephalic NECK: Supple without lymphadenopathy  LUNGS: Clear to auscultation bilaterally. HEART: Regular Rate and Rhythm  ABDOMEN: Soft, non-tender, No Masses.  GU:  nl phallus.  Absent left testis. Right testis with firm mass superior pole.  Non tender  EXTREMITIES: Moves all extremities well.   NEUROLOGIC:  Alert and oriented x 3 MENTAL STATUS:  Appropriate. BACK:  Non-tender to palpation.  No CVAT SKIN:  Warm, dry and intact.    Results: UA- clear

## 2022-10-30 ENCOUNTER — Encounter: Payer: Managed Care, Other (non HMO) | Admitting: Urology

## 2022-10-30 LAB — CBC WITH DIFFERENTIAL/PLATELET
Basophils Absolute: 0.1 10*3/uL (ref 0.0–0.2)
Basos: 1 %
EOS (ABSOLUTE): 0.1 10*3/uL (ref 0.0–0.4)
Eos: 1 %
Hematocrit: 42 % (ref 37.5–51.0)
Hemoglobin: 14.5 g/dL (ref 13.0–17.7)
Immature Grans (Abs): 0 10*3/uL (ref 0.0–0.1)
Immature Granulocytes: 0 %
Lymphocytes Absolute: 1.8 10*3/uL (ref 0.7–3.1)
Lymphs: 29 %
MCH: 31.6 pg (ref 26.6–33.0)
MCHC: 34.5 g/dL (ref 31.5–35.7)
MCV: 92 fL (ref 79–97)
Monocytes Absolute: 0.5 10*3/uL (ref 0.1–0.9)
Monocytes: 8 %
Neutrophils Absolute: 3.8 10*3/uL (ref 1.4–7.0)
Neutrophils: 61 %
Platelets: 245 10*3/uL (ref 150–450)
RBC: 4.59 x10E6/uL (ref 4.14–5.80)
RDW: 11.6 % (ref 11.6–15.4)
WBC: 6.3 10*3/uL (ref 3.4–10.8)

## 2022-10-30 LAB — COMPREHENSIVE METABOLIC PANEL
ALT: 19 IU/L (ref 0–44)
AST: 23 IU/L (ref 0–40)
Albumin/Globulin Ratio: 1.8 (ref 1.2–2.2)
Albumin: 4.8 g/dL (ref 4.3–5.2)
Alkaline Phosphatase: 98 IU/L (ref 44–121)
BUN/Creatinine Ratio: 15 (ref 9–20)
BUN: 18 mg/dL (ref 6–20)
Bilirubin Total: 0.6 mg/dL (ref 0.0–1.2)
CO2: 23 mmol/L (ref 20–29)
Calcium: 9.9 mg/dL (ref 8.7–10.2)
Chloride: 102 mmol/L (ref 96–106)
Creatinine, Ser: 1.22 mg/dL (ref 0.76–1.27)
Globulin, Total: 2.6 g/dL (ref 1.5–4.5)
Glucose: 93 mg/dL (ref 70–99)
Potassium: 4.4 mmol/L (ref 3.5–5.2)
Sodium: 141 mmol/L (ref 134–144)
Total Protein: 7.4 g/dL (ref 6.0–8.5)
eGFR: 83 mL/min/{1.73_m2} (ref >59)

## 2022-10-30 LAB — LACTATE DEHYDROGENASE: LDH: 208 IU/L (ref 121–224)

## 2022-10-30 LAB — BETA HCG QUANT (REF LAB): hCG Quant: <1 m[IU]/mL (ref 0–3)

## 2022-10-30 LAB — AFP TUMOR MARKER: AFP, Serum, Tumor Marker: 5.1 ng/mL (ref 0.0–5.7)

## 2022-11-03 ENCOUNTER — Telehealth: Payer: Self-pay | Admitting: Urology

## 2022-11-03 NOTE — Telephone Encounter (Signed)
Patient's spouse, Leavy Cella, called in regards to patient having an appointment with Dr Margo Aye on 5/15 and she stated that she needs a work note for herself.   Patients spouse call back #: [phone]

## 2022-11-03 NOTE — Telephone Encounter (Signed)
Edit:   Patients Spouse, Lyn Henri callback:  [phone]

## 2022-11-03 NOTE — Telephone Encounter (Signed)
Note written, Daniel Shepherd is aware that there is a copy at the front desk for pick up or they can access it through his mychart.

## 2023-05-06 ENCOUNTER — Other Ambulatory Visit: Payer: Self-pay

## 2023-05-06 ENCOUNTER — Telehealth: Payer: Self-pay

## 2023-05-06 DIAGNOSIS — C6292 Malignant neoplasm of left testis, unspecified whether descended or undescended: Secondary | ICD-10-CM

## 2023-05-06 NOTE — Telephone Encounter (Signed)
Received phone call from patient wife stating that patient was in the ER last night and diagnosed with an "abdominal mass"  Pt requesting to be seen today by Dr. Myna Hidalgo.   Pt and wife aware that there are no open appointments at this time.  Pt scheduled for Friday 05/08/2023 and will be called if there is a cancellation.  This RN attempted to call patient multiple times.  His wife phone doesn't ring and patient phone rings but voicemail is full and unable to leave a message. This RN attempting to call patient to inform him that there was an opening tomorrow 05/07/2023 at 0815.  This RN sent MyChart message to inform patient also.  Scheduling aware to inform family of new appointment date and time if they call back.

## 2023-05-07 ENCOUNTER — Encounter: Payer: Self-pay | Admitting: *Deleted

## 2023-05-07 ENCOUNTER — Inpatient Hospital Stay: Payer: Medicaid Other | Admitting: Hematology & Oncology

## 2023-05-07 ENCOUNTER — Encounter (HOSPITAL_BASED_OUTPATIENT_CLINIC_OR_DEPARTMENT_OTHER): Payer: Self-pay | Admitting: Nurse Practitioner

## 2023-05-07 ENCOUNTER — Other Ambulatory Visit (HOSPITAL_BASED_OUTPATIENT_CLINIC_OR_DEPARTMENT_OTHER): Payer: Self-pay | Admitting: Nurse Practitioner

## 2023-05-07 ENCOUNTER — Inpatient Hospital Stay: Payer: Medicaid Other | Attending: Hematology & Oncology

## 2023-05-07 ENCOUNTER — Encounter: Payer: Self-pay | Admitting: Hematology & Oncology

## 2023-05-07 VITALS — BP 136/70 | HR 52 | Temp 98.1°F | Resp 20 | Ht 73.0 in | Wt 219.4 lb

## 2023-05-07 DIAGNOSIS — C7800 Secondary malignant neoplasm of unspecified lung: Secondary | ICD-10-CM | POA: Diagnosis not present

## 2023-05-07 DIAGNOSIS — C6291 Malignant neoplasm of right testis, unspecified whether descended or undescended: Secondary | ICD-10-CM | POA: Diagnosis present

## 2023-05-07 DIAGNOSIS — C6292 Malignant neoplasm of left testis, unspecified whether descended or undescended: Secondary | ICD-10-CM

## 2023-05-07 LAB — CMP (CANCER CENTER ONLY)
ALT: 12 U/L (ref 0–44)
AST: 13 U/L — ABNORMAL LOW (ref 15–41)
Albumin: 4 g/dL (ref 3.5–5.0)
Alkaline Phosphatase: 83 U/L (ref 38–126)
Anion gap: 5 (ref 5–15)
BUN: 18 mg/dL (ref 6–20)
CO2: 31 mmol/L (ref 22–32)
Calcium: 9.9 mg/dL (ref 8.9–10.3)
Chloride: 104 mmol/L (ref 98–111)
Creatinine: 1.08 mg/dL (ref 0.61–1.24)
GFR, Estimated: >60 mL/min (ref >60)
Glucose, Bld: 97 mg/dL (ref 70–99)
Potassium: 5 mmol/L (ref 3.5–5.1)
Sodium: 140 mmol/L (ref 135–145)
Total Bilirubin: 0.4 mg/dL (ref <1.2)
Total Protein: 7.2 g/dL (ref 6.5–8.1)

## 2023-05-07 LAB — CBC WITH DIFFERENTIAL (CANCER CENTER ONLY)
Abs Immature Granulocytes: 0.03 10*3/uL (ref 0.00–0.07)
Basophils Absolute: 0.1 10*3/uL (ref 0.0–0.1)
Basophils Relative: 1 %
Eosinophils Absolute: 0.6 10*3/uL — ABNORMAL HIGH (ref 0.0–0.5)
Eosinophils Relative: 7 %
HCT: 40.2 % (ref 39.0–52.0)
Hemoglobin: 13.9 g/dL (ref 13.0–17.0)
Immature Granulocytes: 0 %
Lymphocytes Relative: 20 %
Lymphs Abs: 1.6 10*3/uL (ref 0.7–4.0)
MCH: 31.6 pg (ref 26.0–34.0)
MCHC: 34.6 g/dL (ref 30.0–36.0)
MCV: 91.4 fL (ref 80.0–100.0)
Monocytes Absolute: 0.6 10*3/uL (ref 0.1–1.0)
Monocytes Relative: 8 %
Neutro Abs: 5.2 10*3/uL (ref 1.7–7.7)
Neutrophils Relative %: 64 %
Platelet Count: 275 10*3/uL (ref 150–400)
RBC: 4.4 MIL/uL (ref 4.22–5.81)
RDW: 10.8 % — ABNORMAL LOW (ref 11.5–15.5)
WBC Count: 8.1 10*3/uL (ref 4.0–10.5)
nRBC: 0 % (ref 0.0–0.2)

## 2023-05-07 LAB — LACTATE DEHYDROGENASE: LDH: 265 U/L — ABNORMAL HIGH (ref 98–192)

## 2023-05-07 NOTE — Progress Notes (Signed)
Patient is established in this office for a history of left testicle. He received chemo which was completed on 2019. He developed a new primary testicular cancer in the right testicle this summer and had an orchiectomy in July. He was seen in the ED yesterday for pain and was found to have a pelvic mass concerning for mets.   Patient will need complete workup and staging. He will need a CT chest, genetic referral and a scheduled biopsy of the new palvic mass. He sees his Urology Oncologist on Monday at [facility].   Will follow up after visit at [facility] to see what the plan is regarding biopsy and what further action is needed.  Oncology Nurse Navigator Documentation     05/07/2023    8:45 AM  Oncology Nurse Navigator Flowsheets  Abnormal Finding Date 05/06/2023  Diagnosis Status Additional Work Up  Navigator Follow Up Date: 05/11/2023  Navigator Follow Up Reason: Review Note  Museum/gallery exhibitions officer  Navigator Encounter Type Appt/Treatment Plan Review  Patient Visit Type MedOnc  Treatment Phase Abnormal Scans  Barriers/Navigation Needs Coordination of Care  Interventions None Required  Acuity Level 2-Minimal Needs (1-2 Barriers Identified)  Support Groups/Services Friends and Family  Time Spent with Patient 15

## 2023-05-07 NOTE — Progress Notes (Signed)
Hematology and Oncology Follow Up Visit  Daniel Shepherd [id number] [birth year] 27 y.o. 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 Wint is  back for an early follow-up.  Unfortunately, I think that we do have a problem right now.  Surprisingly, he actually had a right orchiectomy that was done down in [place] in July.  This is because he had a new mass in the right testicle.  He underwent the orchiectomy.  He was found to have a stage IA (T1N0M0) seminoma.  This clearly is a different malignancy than what he had on the left testicle.  At the time, there was no evidence of any type of metastatic disease.  In October, he had scans done, also down in [place].  These were negative for any obvious metastatic disease.  Most recently, he was complaining of some pain over on the right side.  He subsequently had a CAT scan that was done.  This was done on 05/06/2023.  Surprisingly, he was found to have a new mass anterior to the psoas muscle measuring 3.8 x 3.3 cm.  Also noted was adjacent aortocaval lymph nodes measuring up to 2.4 cm.  I am absolutely shocked by this.  Also noted is the fact that his beta-hCG was slightly elevated again.  I think when it was checked it was 16.9.  Again, this is incredibly unusual.  For 1 patient to have 2 separate testicular cancers.  We will have to see about further genetic testing for him.  I think the question now is whether this recurrence is from the seminoma that was in the right testicle or for the nonseminoma that was in the left testicle.  The left testicular nonseminomatous germ cell tumor was treated with systemic chemotherapy as it was already metastatic 5 years ago.  Again, I think we had to get a  biopsy.  I will have to talk to his urologist down in [place].  He is feeling okay right now.  He is not having any hematuria.  He is having no problem with cough or shortness of breath.  I think he goes for a CT of the chest tomorrow.  He has had no fever.  He has had no leg swelling.  He has had no rashes.  He has had no weight loss or weight gain.  Overall, I would say his performance status is probably ECOG 0.        Medications:  Current Outpatient Medications:    Creatine Monohydrate POWD, Take by mouth daily., Disp: , Rfl:    HYDROcodone-acetaminophen (NORCO/VICODIN) 5-325 MG tablet, Take 1 tablet by mouth every 4 (four) hours as needed., Disp: , Rfl:    Multiple Vitamin (MULTIVITAMIN) tablet, Take 1 tablet by mouth daily., Disp: , Rfl:    testosterone cypionate (DEPOTESTOSTERONE CYPIONATE) 200 MG/ML injection, Inject into the muscle every 7 (seven) days., Disp: , Rfl:    EPINEPHrine 0.3 mg/0.3 mL IJ SOAJ injection, Inject into the skin. (Patient not taking: Reported on 11/05/2020), Disp: , Rfl:    ondansetron (ZOFRAN-ODT) 4 MG disintegrating tablet, Take by mouth every 8 (eight) hours as needed. (Patient not taking: Reported on 05/07/2023), Disp: , Rfl:  No current facility-administered medications for this visit.  Facility-Administered Medications Ordered in Other Visits:  pegfilgrastim (NEULASTA ONPRO KIT) injection 6 mg, 6 mg, Subcutaneous, Once, Erenest Blank, NP  Allergies:  Allergies  Allergen Reactions   Bee Venom Anaphylaxis   Other Swelling    Bee allergy    Past Medical History, Surgical history, Social history, and Family History were reviewed and updated.  Review of Systems: Review of Systems  Constitutional:  Positive for fatigue.  HENT:  Negative.    Eyes: Negative.   Respiratory:  Positive for shortness of breath.   Cardiovascular: Negative.   Gastrointestinal:  Positive for nausea.  Endocrine: Negative.   Genitourinary: Negative.     Musculoskeletal: Negative.   Skin: Negative.   Neurological: Negative.   Hematological: Negative.   Psychiatric/Behavioral: Negative.      Physical Exam:  height is 6\' 1"  (1.854 m) and weight is 219 lb 6.4 oz (99.5 kg). His oral temperature is 98.1 F (36.7 C). His blood pressure is 136/70 and his pulse is 52 (abnormal). His respiration is 20 and oxygen saturation is 100%.   Wt Readings from Last 3 Encounters:  05/07/23 219 lb 6.4 oz (99.5 kg)  10/29/22 210 lb (95.3 kg)  10/24/22 209 lb (94.8 kg)    Physical Exam Vitals reviewed.  HENT:     Head: Normocephalic and atraumatic.  Eyes:     Pupils: Pupils are equal, round, and reactive to light.  Cardiovascular:     Rate and Rhythm: Normal rate and regular rhythm.     Heart sounds: Normal heart sounds.  Pulmonary:     Effort: Pulmonary effort is normal.     Breath sounds: Normal breath sounds.  Abdominal:     General: Bowel sounds are normal.     Palpations: Abdomen is soft.     Comments: Has a well-healed left inguinal orchiectomy scar.  There is no swelling.  There is no erythema.  There is no tenderness.  Genitourinary:    Comments: Scrotal exam does show a localized area of swelling.  This is in the upper portion of the scrotum.  It is slightly tender.  It probably measures about 1 x 1 cm.  The rest of the scrotum is unremarkable.  He is status post left orchiectomy.  There is no inguinal adenopathy in the right inguinal chain. Musculoskeletal:        General: No tenderness or deformity. Normal range of motion.     Cervical back: Normal range of motion.  Lymphadenopathy:     Cervical: No cervical adenopathy.  Skin:    General: Skin is warm and dry.     Findings: No erythema or rash.  Neurological:     Mental Status: He is alert and oriented to person, place, and time.  Psychiatric:        Behavior: Behavior normal.        Thought Content: Thought content normal.        Judgment: Judgment normal.      Lab  Results  Component Value Date   WBC 8.1 05/07/2023   HGB 13.9 05/07/2023   HCT 40.2 05/07/2023   MCV 91.4 05/07/2023   PLT 275 05/07/2023     Chemistry      Component Value Date/Time   NA 140 05/07/2023 0818   NA 141 10/29/2022 0931   K 5.0 05/07/2023 0818   CL 104 05/07/2023 0818   CO2 31 05/07/2023 0818   BUN 18 05/07/2023 0818   BUN 18 10/29/2022 0931   CREATININE 1.08 05/07/2023 0818  Component Value Date/Time   CALCIUM 9.9 05/07/2023 0818   ALKPHOS 83 05/07/2023 0818   AST 13 (L) 05/07/2023 0818   ALT 12 05/07/2023 0818   BILITOT 0.4 05/07/2023 0818      Impression and Plan: Mr. Rissmiller is a 27 year old white male with metastatic embryonal cell carcinoma.  He had a left orchiectomy.  This was performed back on July 03, 2017.  A week later, he showed up in the emergency room with chest discomfort and some shortness of breath.  He had innumerable pulmonary nodules.  He was treated with systemic chemotherapy.  He had a complete response.  He then developed a second testicular cancer in the right testicle.  This was a pure seminoma.  It was early-stage.  Now, he has what appears to be a tumor adjacent to the right psoas muscle.  He has some aortocaval lymph nodes.  We are going to have to get a biopsy.  Again, I need to talk to his Urologist down in [place].  I would think that an excisional biopsy would be preferred.  This way, we will have enough tissue for molecular studies.  We will have to figure out what type of systemic therapy he would need.  I am unsure if this would qualify as a stem cell transplant candidate.  It would not surprise me if he ultimately required a stem cell transplant.  We will see what the tumor markers show today.  Again, his initial left testicular cancer had elevated beta-hCG.  His beta-hCG is elevated again.  I know this is very complicated.  We will sort all of this out.  Again, a biopsy will help.  We will then figure out when we  get him back to see [place].   Josph Macho, MD 11/21/20243:09 PM

## 2023-05-08 ENCOUNTER — Inpatient Hospital Stay: Payer: Medicaid Other | Admitting: Hematology & Oncology

## 2023-05-08 ENCOUNTER — Inpatient Hospital Stay: Payer: Medicaid Other

## 2023-05-08 LAB — AFP TUMOR MARKER: AFP, Serum, Tumor Marker: 3.6 ng/mL (ref 0.0–5.7)

## 2023-05-08 LAB — BETA HCG QUANT (REF LAB): hCG Quant: 15 m[IU]/mL — ABNORMAL HIGH (ref 0–3)

## 2023-05-09 ENCOUNTER — Encounter (HOSPITAL_BASED_OUTPATIENT_CLINIC_OR_DEPARTMENT_OTHER): Payer: Self-pay

## 2023-05-09 ENCOUNTER — Ambulatory Visit (HOSPITAL_BASED_OUTPATIENT_CLINIC_OR_DEPARTMENT_OTHER)
Admission: RE | Admit: 2023-05-09 | Discharge: 2023-05-09 | Disposition: A | Discharge status: Home / Self Care | Payer: Medicaid Other | Source: Ambulatory Visit | Attending: Nurse Practitioner | Admitting: Nurse Practitioner

## 2023-05-09 DIAGNOSIS — C6291 Malignant neoplasm of right testis, unspecified whether descended or undescended: Secondary | ICD-10-CM | POA: Insufficient documentation

## 2023-05-09 MED ORDER — IOHEXOL 300 MG/ML  SOLN
75.0000 mL | Freq: Once | INTRAMUSCULAR | Status: AC | PRN
  Administered 2023-05-09: 75 mL via INTRAVENOUS

## 2023-05-12 ENCOUNTER — Telehealth: Payer: Self-pay | Admitting: *Deleted

## 2023-05-12 NOTE — Telephone Encounter (Signed)
Error

## 2023-05-12 NOTE — Telephone Encounter (Signed)
Call received from Erskine Squibb, the Urology Navigator at the [facility] to inform Dr. Myna Hidalgo that pt was discussed this morning in GU tumor board and recommendation for pt at this time is for pt to have a biopsy of his right psoas muscle, which Lenis Noon is in the process of getting scheduled.  Dr. Valetta Fuller notified.

## 2023-05-13 ENCOUNTER — Encounter: Payer: Self-pay | Admitting: Hematology & Oncology

## 2023-06-01 ENCOUNTER — Encounter: Payer: Self-pay | Admitting: *Deleted

## 2023-06-01 NOTE — Progress Notes (Signed)
Patient's biopsy completed on 05/28/2023. 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 Results given to Dr Myna Hidalgo. No further path needs at this time.   Oncology Nurse Navigator Documentation     06/01/2023   10:30 AM  Oncology Nurse Navigator Flowsheets  Confirmed Diagnosis Date 05/28/2023  Navigator Location [facility]  Navigator Encounter Type Pathology Review  Patient Visit Type MedOnc  Treatment Phase Pre-Tx/Tx Discussion  Barriers/Navigation Needs Coordination of Care  Interventions Coordination of Care  Acuity Level 2-Minimal Needs (1-2 Barriers Identified)  Coordination of Care Other  Support Groups/Services Friends and Family  Time Spent with Patient 15

## 2023-06-02 ENCOUNTER — Telehealth: Payer: Self-pay | Admitting: *Deleted

## 2023-06-02 NOTE — Telephone Encounter (Signed)
Received a faxed pathology report from [facility]. The patient is aware of the results. Results given to Dr. Myna Hidalgo.

## 2023-06-05 ENCOUNTER — Encounter: Payer: Self-pay | Admitting: *Deleted

## 2023-06-05 NOTE — Progress Notes (Signed)
Dr Myna Hidalgo reviewed patient's path and would like to see him next week for treatment discussion.   Oncology Nurse Navigator Documentation     06/05/2023    8:45 AM  Oncology Nurse Navigator Flowsheets  Navigator Follow Up Date: 06/09/2023  Navigator Follow Up Reason: Follow-up Appointment  Navigator Location [facility]  Navigator Encounter Type Appt/Treatment Plan Review  Patient Visit Type MedOnc  Treatment Phase Pre-Tx/Tx Discussion  Barriers/Navigation Needs Coordination of Care  Interventions None Required  Acuity Level 2-Minimal Needs (1-2 Barriers Identified)  Support Groups/Services Friends and Family  Time Spent with Patient 15

## 2023-06-08 ENCOUNTER — Other Ambulatory Visit: Payer: Self-pay

## 2023-06-08 DIAGNOSIS — C6292 Malignant neoplasm of left testis, unspecified whether descended or undescended: Secondary | ICD-10-CM

## 2023-06-09 ENCOUNTER — Inpatient Hospital Stay: Payer: Medicaid Other | Attending: Hematology & Oncology

## 2023-06-09 ENCOUNTER — Encounter (HOSPITAL_COMMUNITY): Payer: Self-pay

## 2023-06-09 ENCOUNTER — Encounter: Payer: Self-pay | Admitting: *Deleted

## 2023-06-09 ENCOUNTER — Inpatient Hospital Stay (HOSPITAL_BASED_OUTPATIENT_CLINIC_OR_DEPARTMENT_OTHER): Payer: Medicaid Other | Admitting: Hematology & Oncology

## 2023-06-09 ENCOUNTER — Encounter: Payer: Self-pay | Admitting: Hematology & Oncology

## 2023-06-09 VITALS — BP 165/91 | HR 76 | Temp 98.0°F | Resp 17 | Ht 73.0 in | Wt 211.0 lb

## 2023-06-09 DIAGNOSIS — C7800 Secondary malignant neoplasm of unspecified lung: Secondary | ICD-10-CM | POA: Insufficient documentation

## 2023-06-09 DIAGNOSIS — C6291 Malignant neoplasm of right testis, unspecified whether descended or undescended: Secondary | ICD-10-CM | POA: Diagnosis not present

## 2023-06-09 DIAGNOSIS — C6292 Malignant neoplasm of left testis, unspecified whether descended or undescended: Secondary | ICD-10-CM

## 2023-06-09 HISTORY — DX: Malignant neoplasm of right testis, unspecified whether descended or undescended: C62.91

## 2023-06-09 LAB — CMP (CANCER CENTER ONLY)
ALT: 6 U/L (ref 0–44)
AST: 10 U/L — ABNORMAL LOW (ref 15–41)
Albumin: 4.2 g/dL (ref 3.5–5.0)
Alkaline Phosphatase: 175 U/L — ABNORMAL HIGH (ref 38–126)
Anion gap: 9 (ref 5–15)
BUN: 21 mg/dL — ABNORMAL HIGH (ref 6–20)
CO2: 30 mmol/L (ref 22–32)
Calcium: 9.5 mg/dL (ref 8.9–10.3)
Chloride: 98 mmol/L (ref 98–111)
Creatinine: 1.44 mg/dL — ABNORMAL HIGH (ref 0.61–1.24)
GFR, Estimated: >60 mL/min (ref >60)
Glucose, Bld: 156 mg/dL — ABNORMAL HIGH (ref 70–99)
Potassium: 4 mmol/L (ref 3.5–5.1)
Sodium: 137 mmol/L (ref 135–145)
Total Bilirubin: 0.5 mg/dL (ref <1.2)
Total Protein: 7.7 g/dL (ref 6.5–8.1)

## 2023-06-09 LAB — CBC WITH DIFFERENTIAL (CANCER CENTER ONLY)
Abs Immature Granulocytes: 0.03 10*3/uL (ref 0.00–0.07)
Basophils Absolute: 0 10*3/uL (ref 0.0–0.1)
Basophils Relative: 1 %
Eosinophils Absolute: 0.3 10*3/uL (ref 0.0–0.5)
Eosinophils Relative: 4 %
HCT: 35.7 % — ABNORMAL LOW (ref 39.0–52.0)
Hemoglobin: 12.1 g/dL — ABNORMAL LOW (ref 13.0–17.0)
Immature Granulocytes: 0 %
Lymphocytes Relative: 15 %
Lymphs Abs: 1.3 10*3/uL (ref 0.7–4.0)
MCH: 29.9 pg (ref 26.0–34.0)
MCHC: 33.9 g/dL (ref 30.0–36.0)
MCV: 88.1 fL (ref 80.0–100.0)
Monocytes Absolute: 0.5 10*3/uL (ref 0.1–1.0)
Monocytes Relative: 6 %
Neutro Abs: 6.1 10*3/uL (ref 1.7–7.7)
Neutrophils Relative %: 74 %
Platelet Count: 310 10*3/uL (ref 150–400)
RBC: 4.05 MIL/uL — ABNORMAL LOW (ref 4.22–5.81)
RDW: 10.8 % — ABNORMAL LOW (ref 11.5–15.5)
WBC Count: 8.3 10*3/uL (ref 4.0–10.5)
nRBC: 0 % (ref 0.0–0.2)

## 2023-06-09 LAB — LACTATE DEHYDROGENASE: LDH: 672 U/L — ABNORMAL HIGH (ref 98–192)

## 2023-06-09 NOTE — Progress Notes (Signed)
DISCONTINUE OFF PATHWAY REGIMEN - Testicular   [id number]:Ifosfamide 1200 mg/m2 D1-5 + Etoposide 75 mg/m2 D1-5 + Cisplatin 20 mg/m2 D1-5 (VIP) q21 Days:   A cycle is every 21 days:     Cisplatin      [place]      Ifosfamide      Etoposide      Pegfilgrastim-xxxx   **Always confirm dose/schedule in your pharmacy ordering system**  PRIOR TREATMENT: Off Pathway: Ifosfamide 1200 mg/m2 D1-5 + Etoposide 75 mg/m2 D1-5 + Cisplatin 20 mg/m2 D1-5 (VIP) q21 Days  START ON PATHWAY REGIMEN - Testicular     A cycle is every 21 days:     Bleomycin      Etoposide      Cisplatin   **Always confirm dose/schedule in your pharmacy ordering system**  Patient Characteristics: Seminoma, Relapsed, Chemotherapy Naive, Good Risk AJCC T Category: pT2 AJCC N Category: pN2 AJCC M Category: cM1a AJCC S Category Post-Orchiectomy: SX AJCC 8 Stage Grouping: IIIA Histology: Seminoma Risk Status: Good Risk Intent of Therapy: Curative Intent, Discussed with Patient

## 2023-06-09 NOTE — Progress Notes (Signed)
Patient was seen today for treatment recommendations. He will proceed with three cycles of BEP. He will need a port and PFT's prior to start.  Spoke with IR at [facility] and they will get patient in for port on 06/18/2022. Awaiting PFT scheduling.  Oncology Nurse Navigator Documentation     06/09/2023   11:00 AM  Oncology Nurse Navigator Flowsheets  Navigator Follow Up Date: 06/11/2023  Navigator Follow Up Reason: Appointment Review  Navigator Location [facility]  Navigator Encounter Type Appt/Treatment Plan Review  Patient Visit Type MedOnc  Treatment Phase Pre-Tx/Tx Discussion  Barriers/Navigation Needs Coordination of Care  Interventions Coordination of Care  Acuity Level 2-Minimal Needs (1-2 Barriers Identified)  Coordination of Care Radiology  Support Groups/Services Friends and Family  Time Spent with Patient 30

## 2023-06-09 NOTE — Progress Notes (Signed)
Hematology and Oncology Follow Up Visit  Daniel Shepherd [id number] 27-Oct-1995 27 y.o. 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  Unfortunate, we have now diagnosed relapsed disease.  He was down to [place].  He had a percutaneous biopsy.  I think this was done on 05/28/2023.  The pathology report ([id number]) showed seminoma.  Of note, he had a CT of the chest on 05/09/2023.  This did not show any evidence of metastatic disease above the diaphragm.  I think this is basically a relapse of his seminoma.  I am surprised that an early stage seminoma has relapsed.  I think that with systemic chemotherapy, we can cure this.  Given that this is seminoma, I would suspect that this should be fairly chemosensitive.  I would have to believe that the cure rate should be about 95% with systemic chemotherapy.  He feels well.  He is still recovering from the biopsy.  He is little bit constipated because of medication for pain.  He has had no cough or shortness of breath.  He has had no nausea or vomiting.  He has had no abdominal discomfort.  Has had no leg swelling.  He has had no rashes.  He has had no headache.  Of note, his last beta-hCG was 15.  I would have said that his performance status right now is ECOG 0.   Medications:  Current Outpatient Medications:    Creatine Monohydrate POWD, Take by mouth daily., Disp: , Rfl:    EPINEPHrine 0.3 mg/0.3 mL IJ SOAJ injection, Inject into the skin. (Patient not taking: Reported on 11/05/2020), Disp: , Rfl:    Multiple Vitamin (MULTIVITAMIN) tablet, Take 1 tablet by mouth daily., Disp: , Rfl:     ondansetron (ZOFRAN-ODT) 4 MG disintegrating tablet, Take by mouth every 8 (eight) hours as needed. (Patient not taking: Reported on 05/07/2023), Disp: , Rfl:    testosterone cypionate (DEPOTESTOSTERONE CYPIONATE) 200 MG/ML injection, Inject into the muscle every 7 (seven) days., Disp: , Rfl:  No current facility-administered medications for this visit.  Facility-Administered Medications Ordered in Other Visits:    pegfilgrastim (NEULASTA ONPRO KIT) injection 6 mg, 6 mg, Subcutaneous, Once, Erenest Blank, NP  Allergies:  Allergies  Allergen Reactions   Bee Venom Anaphylaxis   Other Swelling    Bee allergy    Past Medical History, Surgical history, Social history, and Family History were reviewed and updated.  Review of Systems: Review of Systems  Constitutional:  Positive for fatigue.  HENT:  Negative.    Eyes: Negative.   Respiratory:  Positive for shortness of breath.   Cardiovascular: Negative.   Gastrointestinal:  Positive for nausea.  Endocrine: Negative.   Genitourinary: Negative.    Musculoskeletal: Negative.   Skin: Negative.   Neurological: Negative.   Hematological: Negative.   Psychiatric/Behavioral: Negative.      Physical Exam:  height is 6\' 1"  (1.854 m) and weight is 211 lb (95.7 kg). His oral temperature is 98 F (36.7 C). His blood pressure is 165/91 (abnormal) and his pulse is 76. His respiration is 17 and oxygen saturation is 100%.   Wt Readings from Last 3 Encounters:  06/09/23  211 lb (95.7 kg)  05/07/23 219 lb 6.4 oz (99.5 kg)  10/29/22 210 lb (95.3 kg)    Physical Exam Vitals reviewed.  HENT:     Head: Normocephalic and atraumatic.  Eyes:     Pupils: Pupils are equal, round, and reactive to light.  Cardiovascular:     Rate and Rhythm: Normal rate and regular rhythm.     Heart sounds: Normal heart sounds.  Pulmonary:     Effort: Pulmonary effort is normal.     Breath sounds: Normal breath sounds.  Abdominal:     General: Bowel sounds are  normal.     Palpations: Abdomen is soft.     Comments: Has a well-healed left inguinal orchiectomy scar.  There is no swelling.  There is no erythema.  There is no tenderness.  Genitourinary:    Comments: Scrotal exam does show a localized area of swelling.  This is in the upper portion of the scrotum.  It is slightly tender.  It probably measures about 1 x 1 cm.  The rest of the scrotum is unremarkable.  He is status post left orchiectomy.  There is no inguinal adenopathy in the right inguinal chain. Musculoskeletal:        General: No tenderness or deformity. Normal range of motion.     Cervical back: Normal range of motion.  Lymphadenopathy:     Cervical: No cervical adenopathy.  Skin:    General: Skin is warm and dry.     Findings: No erythema or rash.  Neurological:     Mental Status: He is alert and oriented to person, place, and time.  Psychiatric:        Behavior: Behavior normal.        Thought Content: Thought content normal.        Judgment: Judgment normal.     Lab Results  Component Value Date   WBC 8.3 06/09/2023   HGB 12.1 (L) 06/09/2023   HCT 35.7 (L) 06/09/2023   MCV 88.1 06/09/2023   PLT 310 06/09/2023     Chemistry      Component Value Date/Time   NA 140 05/07/2023 0818   NA 141 10/29/2022 0931   K 5.0 05/07/2023 0818   CL 104 05/07/2023 0818   CO2 31 05/07/2023 0818   BUN 18 05/07/2023 0818   BUN 18 10/29/2022 0931   CREATININE 1.08 05/07/2023 0818      Component Value Date/Time   CALCIUM 9.9 05/07/2023 0818   ALKPHOS 83 05/07/2023 0818   AST 13 (L) 05/07/2023 0818   ALT 12 05/07/2023 0818   BILITOT 0.4 05/07/2023 0818      Impression and Plan: Daniel Shepherd is a 27 year old white male with metastatic embryonal cell carcinoma.  He had a left orchiectomy.  This was performed back on July 03, 2017.  A week later, he showed up in the emergency room with chest discomfort and some shortness of breath.  He had innumerable pulmonary nodules.  He was  treated with systemic chemotherapy.  He had a complete response.  He then developed a second testicular cancer in the right testicle.  This was a pure seminoma.  It was early-stage.  Now, the seminoma has relapsed.  I think it is clear that there is fact that there is a protective barrier around the testicle prevented his initial chemotherapy that he received back in 2019 from getting into the scrotal sac.  Again I think that this is a relapse from the testicular  cancer on the right with seminoma.  We should be able to eradicate this with systemic chemotherapy.  I will use BEP.  I told he and his wife about this.  I think the bleomycin will be important.  I do not see a downside to using bleomycin.  We will check his pulmonary function test.  He will need to have a Port-A-Cath placed.  He is well aware of the side effects of treatment.  He had VIP with his initial nonseminoma.  He did well with this.  I would like to get started the Monday after New Year's Day.  Again, I think 3 cycles would be appropriate for him.  Again I just really hate that he developed a whole new testicular cancer on the right side.  He had this removed.  I will see you probably him being on testosterone.  I would like to see him back when he starts his second cycle of treatment.  I would repeat his CT scan after the second cycle of treatment so we can see how everything has resolved.   Josph Macho, MD 12/24/20249:57 AM

## 2023-06-10 ENCOUNTER — Other Ambulatory Visit: Payer: Self-pay

## 2023-06-11 ENCOUNTER — Encounter: Payer: Self-pay | Admitting: *Deleted

## 2023-06-11 ENCOUNTER — Encounter: Payer: Self-pay | Admitting: Hematology & Oncology

## 2023-06-11 LAB — BETA HCG QUANT (REF LAB): hCG Quant: 21 m[IU]/mL — ABNORMAL HIGH (ref 0–3)

## 2023-06-11 LAB — AFP TUMOR MARKER: AFP, Serum, Tumor Marker: 3.9 ng/mL (ref 0.0–5.7)

## 2023-06-11 NOTE — Progress Notes (Signed)
Patient scheduled for port on 06/19/2023. He will start treatment on 06/22/2023.  Contacted PFT department and they reached out to patient and he is scheduled for 06/18/2023.  Oncology Nurse Navigator Documentation     06/11/2023    1:00 PM  Oncology Nurse Navigator Flowsheets  Navigator Follow Up Date: 06/22/2023  Navigator Follow Up Reason: Chemotherapy  Navigator Location [facility]  Navigator Encounter Type Appt/Treatment Plan Review  Patient Visit Type MedOnc  Treatment Phase Pre-Tx/Tx Discussion  Barriers/Navigation Needs Coordination of Care  Interventions Coordination of Care  Acuity Level 2-Minimal Needs (1-2 Barriers Identified)  Support Groups/Services Friends and Family  Time Spent with Patient 30

## 2023-06-12 ENCOUNTER — Other Ambulatory Visit: Payer: Self-pay

## 2023-06-15 NOTE — Progress Notes (Signed)
 Pharmacist Chemotherapy Monitoring - Initial Assessment    Anticipated start date: 06/22/23   The following has been reviewed per standard work regarding the patient's treatment regimen: The patient's diagnosis, treatment plan and drug doses, and organ/hematologic function Lab orders and baseline tests specific to treatment regimen  The treatment plan start date, drug sequencing, and pre-medications Prior authorization status  Patient's documented medication list, including drug-drug interaction screen and prescriptions for anti-emetics and supportive care specific to the treatment regimen The drug concentrations, fluid compatibility, administration routes, and timing of the medications to be used The patient's access for treatment and lifetime cumulative dose history, if applicable  The patient's medication allergies and previous infusion related reactions, if applicable   Changes made to treatment plan:  N/A  Follow up needed:  F/u PFTs scheduled 1/2  and port scheduled 1/3   Daniel Shepherd, RPH, 06/15/2023  1:49 PM

## 2023-06-18 ENCOUNTER — Other Ambulatory Visit (HOSPITAL_COMMUNITY): Payer: Self-pay | Admitting: Student

## 2023-06-18 ENCOUNTER — Ambulatory Visit (HOSPITAL_COMMUNITY)
Admission: RE | Admit: 2023-06-18 | Discharge: 2023-06-18 | Disposition: A | Discharge status: Home / Self Care | Payer: Managed Care, Other (non HMO) | Source: Ambulatory Visit | Attending: Hematology & Oncology | Admitting: Hematology & Oncology

## 2023-06-18 DIAGNOSIS — C6291 Malignant neoplasm of right testis, unspecified whether descended or undescended: Secondary | ICD-10-CM | POA: Insufficient documentation

## 2023-06-18 LAB — PULMONARY FUNCTION TEST
DL/VA % pred: 84 %
DL/VA: 4.11 ml/min/mmHg/L
DLCO cor % pred: 90 %
DLCO cor: 32.64 ml/min/mmHg
DLCO unc % pred: 83 %
DLCO unc: 30.08 ml/min/mmHg
FEF 25-75 Pre: 5.02 L/s
FEF2575-%Pred-Pre: 104 %
FEV1-%Pred-Pre: 107 %
FEV1-Pre: 5.25 L
FEV1FVC-%Pred-Pre: 98 %
FEV6-%Pred-Pre: 109 %
FEV6-Pre: 6.47 L
FEV6FVC-%Pred-Pre: 100 %
FVC-%Pred-Pre: 108 %
FVC-Pre: 6.51 L
Pre FEV1/FVC ratio: 81 %
Pre FEV6/FVC Ratio: 99 %
RV % pred: 126 %
RV: 2.23 L
TLC % pred: 116 %
TLC: 8.76 L

## 2023-06-18 NOTE — H&P (Signed)
 Chief Complaint: Chemotherapy Access. Request is for portacath placement  Referring Physician(s): Ennever,Peter R  Supervising Physician: Alona Corners  Patient Status: [facility] - Out-pt  History of Present Illness: Daniel Shepherd is a 28 y.o. male outpatient. History of testicular cancer. concern for metastases. CT chest from 11.23.24 Shows RIJ is accessible.   Patient alert and laying in bed,calm. Family at bedside. Endorses right hip pain. Denies any fevers, headache, chest pain, SOB, cough, abdominal pain, nausea, vomiting or bleeding.   IR previously placed a port on 1.24.19 removed on 4.29.19. Labs from 12.24.24 BUN 21 , Cr 1.44. No pertinent allergies   Return precautions and treatment recommendations and follow-up discussed with the patient and family at bedside. Both  who are agreeable with the plan.    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   Testicular cancer [facility])     Past Surgical History:  Procedure Laterality Date   HYDROCELE EXCISION Left 07/03/2017   Procedure: HYDROCELECTOMY ADULT/ INGUINAL APPROACH/ TESTICULAR BIOPSY/ ORCHIECTOMY;  Surgeon: Ottelin, Mark, MD;  Location: [facility];  Service: Urology;  Laterality: Left;   IR FLUORO GUIDE PORT INSERTION RIGHT  07/09/2017   IR REMOVAL TUN ACCESS W/ PORT W/O FL MOD SED  10/12/2017   IR US  GUIDE VASC ACCESS RIGHT  07/09/2017   NO PAST SURGERIES     ORCHIECTOMY      Allergies: Bee venom and Other  Medications: Prior to Admission medications   Medication Sig Start Date End Date Taking? Authorizing Provider   Creatine Monohydrate POWD Take by mouth daily.    [provider]  EPINEPHrine  0.3 mg/0.3 mL IJ SOAJ injection Inject into the skin. Patient not taking: Reported on 11/05/2020 02/04/20   [provider]  Multiple Vitamin (MULTIVITAMIN) tablet Take 1 tablet by mouth daily.    [provider]  ondansetron  (ZOFRAN -ODT) 4 MG disintegrating tablet Take by mouth every 8 (eight) hours as needed. Patient not taking: Reported on 05/07/2023 05/06/23   [provider]  testosterone cypionate (DEPOTESTOSTERONE CYPIONATE) 200 MG/ML injection Inject into the muscle every 7 (seven) days. 12/18/22   [provider]     Family History  Problem Relation Age of Onset   Healthy Mother    Healthy Father    Colon cancer Other        Lynch syndrome   Breast cancer Maternal Great-grandmother     Social History   Socioeconomic History   Marital status: Married    Spouse name: Not on file   Number of children: Not on file   Years of education: Not on file   Highest education level: Not on file  Occupational History   Not on file  Tobacco Use   Smoking status: Never   Smokeless tobacco: Never  Vaping Use   Vaping status: Never Used  Substance and Sexual Activity   Alcohol use: Yes    Comment: seldom   Drug use: Yes    Types: Marijuana   Sexual activity: Yes  Other Topics Concern   Not on file  Social History Narrative   Not on file   Social Drivers of Health   Financial Resource Strain: Low Risk  (06/03/2023)   Received from [facility]   Overall Financial Resource Strain (CARDIA)    Difficulty of Paying Living Expenses: Not hard at all  Food Insecurity: No Food Insecurity (06/03/2023)   Received from [facility]   Hunger Vital Sign    Worried About Running Out of Food in the Last Year: Never true    Ran Out of Food in the Last Year: Never true  Transportation Needs: No Transportation Needs (06/03/2023)   Received from [facility] - Transportation    Lack of Transportation (Medical): No    Lack of Transportation (Non-Medical): No  Physical Activity: Not on file  Stress: Not on file  Social Connections: Unknown (10/25/2021)   Received from [facility]   Social Network    Social Network: Not on file     Review of Systems: A 12 point ROS discussed and pertinent positives are indicated in the HPI above.  All other systems are negative.  Review of Systems  Constitutional:  Negative for fever.  HENT:  Negative for congestion.   Respiratory:  Negative for cough and shortness of breath.   Cardiovascular:  Negative for chest pain.  Gastrointestinal:  Negative for abdominal pain.  Musculoskeletal:  Positive for arthralgias (right hip pain).  Neurological:  Negative for headaches.  Psychiatric/Behavioral:  Negative for behavioral problems and confusion.     Vital Signs: BP (!) 154/89 (BP Location: Right Arm)   Pulse 61   Temp 98 F (36.7 C) (Oral)   Ht 6' 2 (1.88 m)   Wt 210 lb (95.3 kg)   SpO2 99%   BMI 26.96 kg/m     Physical Exam Vitals and nursing note reviewed.  Constitutional:      Appearance: He is well-developed.  HENT:     Head: Normocephalic.  Cardiovascular:     Rate and Rhythm: Normal rate and regular rhythm.  Pulmonary:     Effort: Pulmonary effort is normal.  Musculoskeletal:        General: Normal range of motion.     Cervical back: Normal range of motion.  Skin:    General: Skin is dry.  Neurological:     General: No focal deficit present.     Mental Status: He is alert and oriented to person, place, and time. Mental status is at baseline.  Psychiatric:        Mood and Affect: Mood normal.        Behavior: Behavior normal.        Thought Content: Thought content normal.        Judgment: Judgment normal.     Imaging: No results found.  Labs:  CBC: Recent Labs    06/23/22 0813 10/29/22 0931 05/07/23 0818 06/09/23 0929  WBC 4.7 6.3 8.1 8.3  HGB 14.1 14.5  13.9 12.1*  HCT 41.3 42.0 40.2 35.7*  PLT 233 245 275 310    COAGS: No results for input(s): INR, APTT in the last 8760 hours.  BMP: Recent Labs    06/23/22 0813 10/29/22 0931 05/07/23 0818 06/09/23 0929  NA 139 141 140 137  K 4.0 4.4 5.0 4.0  CL 103 102 104 98  CO2 28 23 31 30   GLUCOSE 105* 93 97 156*  BUN 17 18 18  21*  CALCIUM 9.4 9.9 9.9 9.5  CREATININE 1.12 1.22 1.08 1.44*  GFRNONAA >  60  --  >60 >60    LIVER FUNCTION TESTS: Recent Labs    06/23/22 0813 10/29/22 0931 05/07/23 0818 06/09/23 0929  BILITOT 1.0 0.6 0.4 0.5  AST 30 23 13* 10*  ALT 20 19 12 6   ALKPHOS 70 98 83 175*  PROT 7.6 7.4 7.2 7.7  ALBUMIN 4.8 4.8 4.0 4.2     Assessment and Plan:  28 y.o. male outpatient. History of testicular cancer. concern for metastases. CT chest from 11.23.24 Shows RIJ is accessible.  Per EPIC no line history. Labs from 12.24.24 BUN 21 , Cr 1.44. No pertinent allergies   PLAN: IR Image Guided Portacath Placement  Risks and benefits of image guided port-a-catheter placement was discussed with the patient including, but not limited to bleeding, infection, pneumothorax, or fibrin sheath development and need for additional procedures.  All of the patient's questions and his family's questions were answered, patient  and family are both  agreeable to proceed.  Consent signed and in chart.   Thank you for this interesting consult.  I greatly enjoyed meeting Chancelor Hardrick and look forward to participating in their care.  A copy of this report was sent to the requesting provider on this date.  Electronically Signed: Delon JAYSON Beagle, NP 06/19/2023, 12:55 PM   I spent a total of  30 Minutes   in face to face in clinical consultation, greater than 50% of which was counseling/coordinating care for portacath placement

## 2023-06-19 ENCOUNTER — Ambulatory Visit (HOSPITAL_COMMUNITY)
Admission: RE | Admit: 2023-06-19 | Discharge: 2023-06-19 | Disposition: A | Discharge status: Home / Self Care | Payer: Managed Care, Other (non HMO) | Source: Ambulatory Visit | Attending: Hematology & Oncology | Admitting: Hematology & Oncology

## 2023-06-19 ENCOUNTER — Other Ambulatory Visit: Payer: Self-pay

## 2023-06-19 DIAGNOSIS — C6290 Malignant neoplasm of unspecified testis, unspecified whether descended or undescended: Secondary | ICD-10-CM | POA: Diagnosis present

## 2023-06-19 DIAGNOSIS — C6292 Malignant neoplasm of left testis, unspecified whether descended or undescended: Secondary | ICD-10-CM

## 2023-06-19 DIAGNOSIS — R062 Wheezing: Secondary | ICD-10-CM | POA: Insufficient documentation

## 2023-06-19 HISTORY — PX: IR IMAGING GUIDED PORT INSERTION: IMG5740

## 2023-06-19 MED ORDER — FENTANYL CITRATE (PF) 100 MCG/2ML IJ SOLN
INTRAMUSCULAR | Status: AC | PRN
  Administered 2023-06-19: 50 ug via INTRAVENOUS
  Administered 2023-06-19 (×2): 25 ug via INTRAVENOUS

## 2023-06-19 MED ORDER — FENTANYL CITRATE (PF) 100 MCG/2ML IJ SOLN
INTRAMUSCULAR | Status: AC
  Filled 2023-06-19: qty 2

## 2023-06-19 MED ORDER — MIDAZOLAM HCL 2 MG/2ML IJ SOLN
INTRAMUSCULAR | Status: AC
Start: 2023-06-19 — End: ?
  Filled 2023-06-19: qty 2

## 2023-06-19 MED ORDER — HEPARIN SOD (PORK) LOCK FLUSH 100 UNIT/ML IV SOLN
INTRAVENOUS | Status: AC
  Filled 2023-06-19: qty 5

## 2023-06-19 MED ORDER — LIDOCAINE-EPINEPHRINE 1 %-1:100000 IJ SOLN
20.0000 mL | Freq: Once | INTRAMUSCULAR | Status: AC
  Administered 2023-06-19: 20 mL via INTRADERMAL

## 2023-06-19 MED ORDER — MIDAZOLAM HCL 2 MG/2ML IJ SOLN
INTRAMUSCULAR | Status: AC | PRN
  Administered 2023-06-19 (×2): .5 mg via INTRAVENOUS
  Administered 2023-06-19: 1 mg via INTRAVENOUS

## 2023-06-19 MED ORDER — HEPARIN SOD (PORK) LOCK FLUSH 100 UNIT/ML IV SOLN
500.0000 [IU] | Freq: Once | INTRAVENOUS | Status: AC
  Administered 2023-06-19: 500 [IU] via INTRAVENOUS

## 2023-06-19 MED ORDER — LIDOCAINE-EPINEPHRINE 1 %-1:100000 IJ SOLN
INTRAMUSCULAR | Status: AC
  Filled 2023-06-19: qty 1

## 2023-06-19 NOTE — Progress Notes (Signed)
 Patient and wife was given discharge instructions, both verbalized understanding.

## 2023-06-19 NOTE — Progress Notes (Signed)
 Pt ambulated without difficulty or bleeding.  Discharge instructions given to pt and     who verbalize understanding and deny further questions.  Discharged home with  wife who will drive and stay with pt x 24 hrs.

## 2023-06-19 NOTE — Progress Notes (Signed)
Report received from Vivian,RN ?

## 2023-06-19 NOTE — Procedures (Signed)
 Interventional Radiology Procedure Note  Procedure: Placement of a right IJ approach single lumen PowerPort.  Tip is positioned at the superior cavoatrial junction and catheter is ready for immediate use.  Complications: None Recommendations:  - Ok to shower tomorrow - Do not submerge for 7 days - Routine line care   Signed,  Yvone Neu. Loreta Ave, DO

## 2023-06-22 ENCOUNTER — Inpatient Hospital Stay: Payer: Managed Care, Other (non HMO)

## 2023-06-22 ENCOUNTER — Inpatient Hospital Stay: Payer: Managed Care, Other (non HMO) | Attending: Hematology & Oncology

## 2023-06-22 VITALS — BP 172/105 | HR 61 | Temp 98.0°F | Resp 18

## 2023-06-22 DIAGNOSIS — C6291 Malignant neoplasm of right testis, unspecified whether descended or undescended: Secondary | ICD-10-CM | POA: Insufficient documentation

## 2023-06-22 DIAGNOSIS — Z5111 Encounter for antineoplastic chemotherapy: Secondary | ICD-10-CM | POA: Diagnosis present

## 2023-06-22 DIAGNOSIS — C7800 Secondary malignant neoplasm of unspecified lung: Secondary | ICD-10-CM | POA: Insufficient documentation

## 2023-06-22 LAB — CBC WITH DIFFERENTIAL (CANCER CENTER ONLY)
Abs Immature Granulocytes: 0.17 10*3/uL — ABNORMAL HIGH (ref 0.00–0.07)
Basophils Absolute: 0.1 10*3/uL (ref 0.0–0.1)
Basophils Relative: 1 %
Eosinophils Absolute: 0.4 10*3/uL (ref 0.0–0.5)
Eosinophils Relative: 4 %
HCT: 32.4 % — ABNORMAL LOW (ref 39.0–52.0)
Hemoglobin: 11.1 g/dL — ABNORMAL LOW (ref 13.0–17.0)
Immature Granulocytes: 2 %
Lymphocytes Relative: 15 %
Lymphs Abs: 1.4 10*3/uL (ref 0.7–4.0)
MCH: 30.1 pg (ref 26.0–34.0)
MCHC: 34.3 g/dL (ref 30.0–36.0)
MCV: 87.8 fL (ref 80.0–100.0)
Monocytes Absolute: 0.7 10*3/uL (ref 0.1–1.0)
Monocytes Relative: 8 %
Neutro Abs: 6.7 10*3/uL (ref 1.7–7.7)
Neutrophils Relative %: 70 %
Platelet Count: 332 10*3/uL (ref 150–400)
RBC: 3.69 MIL/uL — ABNORMAL LOW (ref 4.22–5.81)
RDW: 11.2 % — ABNORMAL LOW (ref 11.5–15.5)
WBC Count: 9.4 10*3/uL (ref 4.0–10.5)
nRBC: 0 % (ref 0.0–0.2)

## 2023-06-22 LAB — CMP (CANCER CENTER ONLY)
ALT: 7 U/L (ref 0–44)
AST: 13 U/L — ABNORMAL LOW (ref 15–41)
Albumin: 4.3 g/dL (ref 3.5–5.0)
Alkaline Phosphatase: 166 U/L — ABNORMAL HIGH (ref 38–126)
Anion gap: 9 (ref 5–15)
BUN: 16 mg/dL (ref 6–20)
CO2: 29 mmol/L (ref 22–32)
Calcium: 9.7 mg/dL (ref 8.9–10.3)
Chloride: 96 mmol/L — ABNORMAL LOW (ref 98–111)
Creatinine: 1.35 mg/dL — ABNORMAL HIGH (ref 0.61–1.24)
GFR, Estimated: >60 mL/min (ref >60)
Glucose, Bld: 97 mg/dL (ref 70–99)
Potassium: 4 mmol/L (ref 3.5–5.1)
Sodium: 134 mmol/L — ABNORMAL LOW (ref 135–145)
Total Bilirubin: 0.5 mg/dL (ref 0.0–1.2)
Total Protein: 7.9 g/dL (ref 6.5–8.1)

## 2023-06-22 LAB — MAGNESIUM: Magnesium: 1.9 mg/dL (ref 1.7–2.4)

## 2023-06-22 LAB — LACTATE DEHYDROGENASE: LDH: 805 U/L — ABNORMAL HIGH (ref 98–192)

## 2023-06-22 MED ORDER — DEXAMETHASONE 4 MG PO TABS: 8.0000 mg | ORAL_TABLET | Freq: Every day | ORAL | 1 refills | Status: DC

## 2023-06-22 MED ORDER — KETOROLAC TROMETHAMINE 15 MG/ML IJ SOLN
30.0000 mg | Freq: Once | INTRAMUSCULAR | Status: AC
Start: 2023-06-22 — End: 2023-06-22
  Administered 2023-06-22: 30 mg via INTRAVENOUS
  Filled 2023-06-22: qty 2

## 2023-06-22 MED ORDER — PALONOSETRON HCL INJECTION 0.25 MG/5ML
0.2500 mg | Freq: Once | INTRAVENOUS | Status: AC
  Administered 2023-06-22: 0.25 mg via INTRAVENOUS
  Filled 2023-06-22: qty 5

## 2023-06-22 MED ORDER — ONDANSETRON HCL 8 MG PO TABS: 8.0000 mg | ORAL_TABLET | Freq: Three times a day (TID) | ORAL | 1 refills | Status: DC | PRN

## 2023-06-22 MED ORDER — SODIUM CHLORIDE 0.9% FLUSH
10.0000 mL | INTRAVENOUS | Status: DC | PRN
  Administered 2023-06-22: 10 mL

## 2023-06-22 MED ORDER — HEPARIN SOD (PORK) LOCK FLUSH 100 UNIT/ML IV SOLN
500.0000 [IU] | Freq: Once | INTRAVENOUS | Status: AC | PRN
  Administered 2023-06-22: 500 [IU]

## 2023-06-22 MED ORDER — SODIUM CHLORIDE 0.9 % IV SOLN
INTRAVENOUS | Status: DC
Start: 2023-06-22 — End: 2023-06-22

## 2023-06-22 MED ORDER — LIDOCAINE-PRILOCAINE 2.5-2.5 % EX CREA: TOPICAL_CREAM | CUTANEOUS | 3 refills | Status: DC

## 2023-06-22 MED ORDER — SODIUM CHLORIDE 0.9 % IV SOLN
100.0000 mg/m2 | Freq: Once | INTRAVENOUS | Status: AC
  Administered 2023-06-22: 222 mg via INTRAVENOUS
  Filled 2023-06-22: qty 11.1

## 2023-06-22 MED ORDER — SODIUM CHLORIDE 0.9 % IV SOLN
150.0000 mg | Freq: Once | INTRAVENOUS | Status: AC
  Administered 2023-06-22: 150 mg via INTRAVENOUS
  Filled 2023-06-22: qty 150

## 2023-06-22 MED ORDER — MAGNESIUM SULFATE 2 GM/50ML IV SOLN
2.0000 g | Freq: Once | INTRAVENOUS | Status: AC
  Administered 2023-06-22: 2 g via INTRAVENOUS
  Filled 2023-06-22: qty 50

## 2023-06-22 MED ORDER — SODIUM CHLORIDE 0.9 % IV SOLN
20.0000 mg/m2 | Freq: Once | INTRAVENOUS | Status: AC
  Administered 2023-06-22: 44 mg via INTRAVENOUS
  Filled 2023-06-22: qty 44

## 2023-06-22 MED ORDER — PROCHLORPERAZINE MALEATE 10 MG PO TABS: 10.0000 mg | ORAL_TABLET | Freq: Four times a day (QID) | ORAL | 1 refills | Status: DC | PRN

## 2023-06-22 MED ORDER — DEXAMETHASONE SODIUM PHOSPHATE 10 MG/ML IJ SOLN
10.0000 mg | Freq: Once | INTRAMUSCULAR | Status: AC
  Administered 2023-06-22: 10 mg via INTRAVENOUS
  Filled 2023-06-22: qty 1

## 2023-06-22 MED ORDER — POTASSIUM CHLORIDE IN NACL 20-0.9 MEQ/L-% IV SOLN
Freq: Once | INTRAVENOUS | Status: AC
  Filled 2023-06-22: qty 1000

## 2023-06-22 NOTE — Progress Notes (Signed)
 Per Dr. Myna Hidalgo, okay to treat today, despite BP

## 2023-06-22 NOTE — Patient Instructions (Signed)

## 2023-06-22 NOTE — Patient Instructions (Addendum)
 [facility] - A DEPT OF [place]  Discharge Instructions: Thank you for choosing [facility] to provide your oncology and hematology care.   If you have a lab appointment with the Cancer Center, please go directly to the Cancer Center and check in at the registration area.  Wear comfortable clothing and clothing appropriate for easy access to any Portacath or PICC line.   We strive to give you quality time with your provider. You may need to reschedule your appointment if you arrive late (15 or more minutes).  Arriving late affects you and other patients whose appointments are after yours.  Also, if you miss three or more appointments without notifying the office, you may be dismissed from the clinic at the provider's discretion.      For prescription refill requests, have your pharmacy contact our office and allow 72 hours for refills to be completed.    Today you received the following chemotherapy and/or immunotherapy agents cisplatiin, vp-16,     To help prevent nausea and vomiting after your treatment, we encourage you to take your nausea medication as directed.  BELOW ARE SYMPTOMS THAT SHOULD BE REPORTED IMMEDIATELY: *FEVER GREATER THAN 100.4 F (38 C) OR HIGHER *CHILLS OR SWEATING *NAUSEA AND VOMITING THAT IS NOT CONTROLLED WITH YOUR NAUSEA MEDICATION *UNUSUAL SHORTNESS OF BREATH *UNUSUAL BRUISING OR BLEEDING *URINARY PROBLEMS (pain or burning when urinating, or frequent urination) *BOWEL PROBLEMS (unusual diarrhea, constipation, pain near the anus) TENDERNESS IN MOUTH AND THROAT WITH OR WITHOUT PRESENCE OF ULCERS (sore throat, sores in mouth, or a toothache) UNUSUAL RASH, SWELLING OR PAIN  UNUSUAL VAGINAL DISCHARGE OR ITCHING   Items with * indicate a potential emergency and should be followed up as soon as possible or go to the Emergency Department if any problems should occur.  Please show the CHEMOTHERAPY ALERT CARD or  IMMUNOTHERAPY ALERT CARD at check-in to the Emergency Department and triage nurse. Should you have questions after your visit or need to cancel or reschedule your appointment, please contact [facility] - A DEPT OF [facility]  [phone] and follow the prompts.  Office hours are 8:00 a.m. to 4:30 p.m. Monday - Friday. Please note that voicemails left after 4:00 p.m. may not be returned until the following business day.  We are closed weekends and major holidays. You have access to a nurse at all times for urgent questions. Please call the main number to the clinic [phone] and follow the prompts.  For any non-urgent questions, you may also contact your provider using MyChart. We now offer e-Visits for anyone 29 and older to request care online for non-urgent symptoms. For details visit [email or web address].   Also download the MyChart app! Go to the app store, search MyChart, open the app, select [place], and log in with your MyChart username and password.

## 2023-06-23 ENCOUNTER — Other Ambulatory Visit: Payer: Self-pay | Admitting: *Deleted

## 2023-06-23 ENCOUNTER — Inpatient Hospital Stay: Payer: Managed Care, Other (non HMO)

## 2023-06-23 ENCOUNTER — Encounter: Payer: Self-pay | Admitting: *Deleted

## 2023-06-23 VITALS — BP 162/89 | HR 99 | Resp 16

## 2023-06-23 DIAGNOSIS — C6291 Malignant neoplasm of right testis, unspecified whether descended or undescended: Secondary | ICD-10-CM

## 2023-06-23 DIAGNOSIS — Z5111 Encounter for antineoplastic chemotherapy: Secondary | ICD-10-CM | POA: Diagnosis not present

## 2023-06-23 LAB — BETA HCG QUANT (REF LAB): hCG Quant: 27 m[IU]/mL — ABNORMAL HIGH (ref 0–3)

## 2023-06-23 MED ORDER — SODIUM CHLORIDE 0.9 % IV SOLN: INTRAVENOUS | Status: DC

## 2023-06-23 MED ORDER — SODIUM CHLORIDE 0.9 % IV SOLN
20.0000 mg/m2 | Freq: Once | INTRAVENOUS | Status: AC
  Administered 2023-06-23: 44 mg via INTRAVENOUS
  Filled 2023-06-23: qty 44

## 2023-06-23 MED ORDER — DEXAMETHASONE SODIUM PHOSPHATE 10 MG/ML IJ SOLN
10.0000 mg | Freq: Once | INTRAMUSCULAR | Status: AC
  Administered 2023-06-23: 10 mg via INTRAVENOUS
  Filled 2023-06-23: qty 1

## 2023-06-23 MED ORDER — HYDROCODONE-ACETAMINOPHEN 5-325 MG PO TABS: 1.0000 | ORAL_TABLET | Freq: Four times a day (QID) | ORAL | 0 refills | Status: DC | PRN

## 2023-06-23 MED ORDER — POTASSIUM CHLORIDE IN NACL 20-0.9 MEQ/L-% IV SOLN
Freq: Once | INTRAVENOUS | Status: AC
  Filled 2023-06-23: qty 1000

## 2023-06-23 MED ORDER — SODIUM CHLORIDE 0.9 % IV SOLN
30.0000 [IU] | Freq: Once | INTRAVENOUS | Status: AC
  Administered 2023-06-23: 30 [IU] via INTRAVENOUS
  Filled 2023-06-23: qty 10

## 2023-06-23 MED ORDER — HEPARIN SOD (PORK) LOCK FLUSH 100 UNIT/ML IV SOLN
500.0000 [IU] | Freq: Once | INTRAVENOUS | Status: AC | PRN
  Administered 2023-06-23: 500 [IU]

## 2023-06-23 MED ORDER — SODIUM CHLORIDE 0.9% FLUSH
10.0000 mL | INTRAVENOUS | Status: DC | PRN
Start: 2023-06-23 — End: 2023-06-23
  Administered 2023-06-23: 10 mL

## 2023-06-23 MED ORDER — MAGNESIUM SULFATE 2 GM/50ML IV SOLN
2.0000 g | Freq: Once | INTRAVENOUS | Status: AC
Start: 2023-06-23 — End: 2023-06-23
  Administered 2023-06-23: 2 g via INTRAVENOUS
  Filled 2023-06-23: qty 50

## 2023-06-23 MED ORDER — SODIUM CHLORIDE 0.9 % IV SOLN
100.0000 mg/m2 | Freq: Once | INTRAVENOUS | Status: AC
  Administered 2023-06-23: 222 mg via INTRAVENOUS
  Filled 2023-06-23: qty 11.1

## 2023-06-23 NOTE — Progress Notes (Signed)
 OK to run hydration fluids with Cisplatin per order of Dr. Myna Hidalgo.

## 2023-06-23 NOTE — Patient Instructions (Signed)
 [facility] - A DEPT OF [place]  Discharge Instructions: Thank you for choosing [facility] to provide your oncology and hematology care.   If you have a lab appointment with the Cancer Center, please go directly to the Cancer Center and check in at the registration area.  Wear comfortable clothing and clothing appropriate for easy access to any Portacath or PICC line.   We strive to give you quality time with your provider. You may need to reschedule your appointment if you arrive late (15 or more minutes).  Arriving late affects you and other patients whose appointments are after yours.  Also, if you miss three or more appointments without notifying the office, you may be dismissed from the clinic at the provider's discretion.      For prescription refill requests, have your pharmacy contact our office and allow 72 hours for refills to be completed.    Today you received the following chemotherapy and/or immunotherapy agents:  Bleomycin , Cisplatin  and Etoposide       To help prevent nausea and vomiting after your treatment, we encourage you to take your nausea medication as directed.  BELOW ARE SYMPTOMS THAT SHOULD BE REPORTED IMMEDIATELY: *FEVER GREATER THAN 100.4 F (38 C) OR HIGHER *CHILLS OR SWEATING *NAUSEA AND VOMITING THAT IS NOT CONTROLLED WITH YOUR NAUSEA MEDICATION *UNUSUAL SHORTNESS OF BREATH *UNUSUAL BRUISING OR BLEEDING *URINARY PROBLEMS (pain or burning when urinating, or frequent urination) *BOWEL PROBLEMS (unusual diarrhea, constipation, pain near the anus) TENDERNESS IN MOUTH AND THROAT WITH OR WITHOUT PRESENCE OF ULCERS (sore throat, sores in mouth, or a toothache) UNUSUAL RASH, SWELLING OR PAIN  UNUSUAL VAGINAL DISCHARGE OR ITCHING   Items with * indicate a potential emergency and should be followed up as soon as possible or go to the Emergency Department if any problems should occur.  Please show the CHEMOTHERAPY  ALERT CARD or IMMUNOTHERAPY ALERT CARD at check-in to the Emergency Department and triage nurse. Should you have questions after your visit or need to cancel or reschedule your appointment, please contact [facility] - A DEPT OF [facility]  [phone] and follow the prompts.  Office hours are 8:00 a.m. to 4:30 p.m. Monday - Friday. Please note that voicemails left after 4:00 p.m. may not be returned until the following business day.  We are closed weekends and major holidays. You have access to a nurse at all times for urgent questions. Please call the main number to the clinic [phone] and follow the prompts.  For any non-urgent questions, you may also contact your provider using MyChart. We now offer e-Visits for anyone 3 and older to request care online for non-urgent symptoms. For details visit [email or web address].   Also download the MyChart app! Go to the app store, search MyChart, open the app, select [place], and log in with your MyChart username and password.

## 2023-06-23 NOTE — Progress Notes (Signed)
 Patient initiated treatment for his stage IIIA testicular cancer.  Oncology Nurse Navigator Documentation     06/23/2023    9:15 AM  Oncology Nurse Navigator Flowsheets  Planned Course of Treatment Chemotherapy  Phase of Treatment Chemo  Chemotherapy Actual Start Date: 06/22/2023  Chemotherapy Expected End Date: 08/19/2023  Navigator Follow Up Date: 07/13/2023  Navigator Follow Up Reason: Follow-up Appointment;Chemotherapy  Navigator Location [facility]  Navigator Encounter Type Appt/Treatment Plan Review  Treatment Initiated Date 08/19/2023  Patient Visit Type MedOnc  Treatment Phase Active Tx  Barriers/Navigation Needs Coordination of Care  Interventions None Required  Acuity Level 2-Minimal Needs (1-2 Barriers Identified)  Support Groups/Services Friends and Family  Time Spent with Patient 15

## 2023-06-24 ENCOUNTER — Encounter: Payer: Self-pay | Admitting: *Deleted

## 2023-06-24 ENCOUNTER — Inpatient Hospital Stay: Payer: Managed Care, Other (non HMO)

## 2023-06-24 VITALS — BP 169/99 | HR 67 | Temp 97.5°F | Resp 17

## 2023-06-24 DIAGNOSIS — C6292 Malignant neoplasm of left testis, unspecified whether descended or undescended: Secondary | ICD-10-CM

## 2023-06-24 DIAGNOSIS — C6291 Malignant neoplasm of right testis, unspecified whether descended or undescended: Secondary | ICD-10-CM

## 2023-06-24 DIAGNOSIS — Z5111 Encounter for antineoplastic chemotherapy: Secondary | ICD-10-CM | POA: Diagnosis not present

## 2023-06-24 MED ORDER — SODIUM CHLORIDE 0.9% FLUSH
10.0000 mL | INTRAVENOUS | Status: DC | PRN
  Administered 2023-06-24: 10 mL

## 2023-06-24 MED ORDER — DEXAMETHASONE SODIUM PHOSPHATE 10 MG/ML IJ SOLN
10.0000 mg | Freq: Once | INTRAMUSCULAR | Status: AC
Start: 2023-06-24 — End: 2023-06-24
  Administered 2023-06-24: 10 mg via INTRAVENOUS
  Filled 2023-06-24: qty 1

## 2023-06-24 MED ORDER — PALONOSETRON HCL INJECTION 0.25 MG/5ML
0.2500 mg | Freq: Once | INTRAVENOUS | Status: AC
  Administered 2023-06-24: 0.25 mg via INTRAVENOUS
  Filled 2023-06-24: qty 5

## 2023-06-24 MED ORDER — SODIUM CHLORIDE 0.9 % IV SOLN
150.0000 mg | Freq: Once | INTRAVENOUS | Status: AC
  Administered 2023-06-24: 150 mg via INTRAVENOUS
  Filled 2023-06-24: qty 150

## 2023-06-24 MED ORDER — SODIUM CHLORIDE 0.9 % IV SOLN: INTRAVENOUS | Status: DC

## 2023-06-24 MED ORDER — SODIUM CHLORIDE 0.9 % IV SOLN
100.0000 mg/m2 | Freq: Once | INTRAVENOUS | Status: AC
  Administered 2023-06-24: 222 mg via INTRAVENOUS
  Filled 2023-06-24: qty 11.1

## 2023-06-24 MED ORDER — MAGNESIUM SULFATE 2 GM/50ML IV SOLN
2.0000 g | Freq: Once | INTRAVENOUS | Status: AC
  Administered 2023-06-24: 2 g via INTRAVENOUS
  Filled 2023-06-24: qty 50

## 2023-06-24 MED ORDER — HEPARIN SOD (PORK) LOCK FLUSH 100 UNIT/ML IV SOLN
500.0000 [IU] | Freq: Once | INTRAVENOUS | Status: AC | PRN
  Administered 2023-06-24: 500 [IU]

## 2023-06-24 MED ORDER — POTASSIUM CHLORIDE IN NACL 20-0.9 MEQ/L-% IV SOLN
Freq: Once | INTRAVENOUS | Status: AC
  Filled 2023-06-24: qty 1000

## 2023-06-24 MED ORDER — CISPLATIN CHEMO INJECTION 100MG/100ML
20.0000 mg/m2 | Freq: Once | INTRAVENOUS | Status: AC
  Administered 2023-06-24: 44 mg via INTRAVENOUS
  Filled 2023-06-24: qty 44

## 2023-06-24 NOTE — Progress Notes (Signed)
 Spoke with patient in infusion. Introduced myself and explained my role. Notified patient of referral to Social Work and Nutrition. Provided patient with business cards for all.   Previous mention of need for genetic consult, however patient had genetic work up in 2020 with previous diagnosis.   Oncology Nurse Navigator Documentation     06/24/2023    9:30 AM  Oncology Nurse Navigator Flowsheets  Navigator Follow Up Date: 07/13/2023  Navigator Follow Up Reason: Follow-up Appointment;Chemotherapy  Navigator Location [facility]  Navigator Encounter Type Treatment  Patient Visit Type MedOnc  Treatment Phase Active Tx  Barriers/Navigation Needs Coordination of Care  Interventions Education;Referrals  Acuity Level 2-Minimal Needs (1-2 Barriers Identified)  Referrals Genetics;Social Work  Support Groups/Services Friends and Family  Time Spent with Patient 15  Genetic Counseling Type Non-Urgent  Genetic Counseling Date 07/08/2018

## 2023-06-24 NOTE — Progress Notes (Signed)
 Per Dr. Myna Hidalgo, okay to treat today despite elevated BP.

## 2023-06-24 NOTE — Patient Instructions (Signed)
 [facility] A DEPT OF [place]  Discharge Instructions: Thank you for choosing [facility] to provide your oncology and hematology care.   If you have a lab appointment with the Cancer Center, please go directly to the Cancer Center and check in at the registration area.  Wear comfortable clothing and clothing appropriate for easy access to any Portacath or PICC line.   We strive to give you quality time with your provider. You may need to reschedule your appointment if you arrive late (15 or more minutes).  Arriving late affects you and other patients whose appointments are after yours.  Also, if you miss three or more appointments without notifying the office, you may be dismissed from the clinic at the provider's discretion.      For prescription refill requests, have your pharmacy contact our office and allow 72 hours for refills to be completed.    Today you received the following chemotherapy and/or immunotherapy agents cisplatin , vp-16    To help prevent nausea and vomiting after your treatment, we encourage you to take your nausea medication as directed.  BELOW ARE SYMPTOMS THAT SHOULD BE REPORTED IMMEDIATELY: *FEVER GREATER THAN 100.4 F (38 C) OR HIGHER *CHILLS OR SWEATING *NAUSEA AND VOMITING THAT IS NOT CONTROLLED WITH YOUR NAUSEA MEDICATION *UNUSUAL SHORTNESS OF BREATH *UNUSUAL BRUISING OR BLEEDING *URINARY PROBLEMS (pain or burning when urinating, or frequent urination) *BOWEL PROBLEMS (unusual diarrhea, constipation, pain near the anus) TENDERNESS IN MOUTH AND THROAT WITH OR WITHOUT PRESENCE OF ULCERS (sore throat, sores in mouth, or a toothache) UNUSUAL RASH, SWELLING OR PAIN  UNUSUAL VAGINAL DISCHARGE OR ITCHING   Items with * indicate a potential emergency and should be followed up as soon as possible or go to the Emergency Department if any problems should occur.  Please show the CHEMOTHERAPY ALERT CARD or  IMMUNOTHERAPY ALERT CARD at check-in to the Emergency Department and triage nurse. Should you have questions after your visit or need to cancel or reschedule your appointment, please contact [facility] - A DEPT OF [facility]  [phone] and follow the prompts.  Office hours are 8:00 a.m. to 4:30 p.m. Monday - Friday. Please note that voicemails left after 4:00 p.m. may not be returned until the following business day.  We are closed weekends and major holidays. You have access to a nurse at all times for urgent questions. Please call the main number to the clinic [phone] and follow the prompts.  For any non-urgent questions, you may also contact your provider using MyChart. We now offer e-Visits for anyone 21 and older to request care online for non-urgent symptoms. For details visit [email or web address].   Also download the MyChart app! Go to the app store, search MyChart, open the app, select [place], and log in with your MyChart username and password.

## 2023-06-25 ENCOUNTER — Inpatient Hospital Stay: Payer: Managed Care, Other (non HMO)

## 2023-06-25 VITALS — BP 173/101 | HR 73 | Temp 94.7°F | Resp 17

## 2023-06-25 DIAGNOSIS — C6291 Malignant neoplasm of right testis, unspecified whether descended or undescended: Secondary | ICD-10-CM

## 2023-06-25 DIAGNOSIS — Z5111 Encounter for antineoplastic chemotherapy: Secondary | ICD-10-CM | POA: Diagnosis not present

## 2023-06-25 MED ORDER — POTASSIUM CHLORIDE IN NACL 20-0.9 MEQ/L-% IV SOLN
Freq: Once | INTRAVENOUS | Status: AC
  Filled 2023-06-25: qty 1000

## 2023-06-25 MED ORDER — HEPARIN SOD (PORK) LOCK FLUSH 100 UNIT/ML IV SOLN
500.0000 [IU] | Freq: Once | INTRAVENOUS | Status: AC | PRN
Start: 2023-06-25 — End: 2023-06-25
  Administered 2023-06-25: 500 [IU]

## 2023-06-25 MED ORDER — SODIUM CHLORIDE 0.9% FLUSH
10.0000 mL | INTRAVENOUS | Status: DC | PRN
  Administered 2023-06-25: 10 mL

## 2023-06-25 MED ORDER — SODIUM CHLORIDE 0.9 % IV SOLN: INTRAVENOUS | Status: DC

## 2023-06-25 MED ORDER — CISPLATIN CHEMO INJECTION 100MG/100ML
20.0000 mg/m2 | Freq: Once | INTRAVENOUS | Status: AC
  Administered 2023-06-25: 44 mg via INTRAVENOUS
  Filled 2023-06-25: qty 44

## 2023-06-25 MED ORDER — ETOPOSIDE CHEMO INJECTION 1 GM/50ML
100.0000 mg/m2 | Freq: Once | INTRAVENOUS | Status: AC
  Administered 2023-06-25: 222 mg via INTRAVENOUS
  Filled 2023-06-25: qty 11.1

## 2023-06-25 MED ORDER — MAGNESIUM SULFATE 2 GM/50ML IV SOLN
2.0000 g | Freq: Once | INTRAVENOUS | Status: AC
  Administered 2023-06-25: 2 g via INTRAVENOUS
  Filled 2023-06-25: qty 50

## 2023-06-25 MED ORDER — DEXAMETHASONE SODIUM PHOSPHATE 10 MG/ML IJ SOLN
10.0000 mg | Freq: Once | INTRAMUSCULAR | Status: AC
Start: 2023-06-25 — End: 2023-06-25
  Administered 2023-06-25: 10 mg via INTRAVENOUS
  Filled 2023-06-25: qty 1

## 2023-06-25 NOTE — Progress Notes (Signed)
 Per Dr. Myna Hidalgo okay to treat today with elevated BP.

## 2023-06-25 NOTE — Patient Instructions (Signed)
 [facility]  Discharge Instructions: Thank you for choosing [facility] to provide your oncology and hematology care.   If you have a lab appointment with the Cancer Center, please go directly to the Cancer Center and check in at the registration area.  Wear comfortable clothing and clothing appropriate for easy access to any Portacath or PICC line.   We strive to give you quality time with your provider. You may need to reschedule your appointment if you arrive late (15 or more minutes).  Arriving late affects you and other patients whose appointments are after yours.  Also, if you miss three or more appointments without notifying the office, you may be dismissed from the clinic at the provider's discretion.      For prescription refill requests, have your pharmacy contact our office and allow 72 hours for refills to be completed.    Today you received the following chemotherapy and/or immunotherapy agents cisplatin, VP-16      To help prevent nausea and vomiting after your treatment, we encourage you to take your nausea medication as directed.  BELOW ARE SYMPTOMS THAT SHOULD BE REPORTED IMMEDIATELY: *FEVER GREATER THAN 100.4 F (38 C) OR HIGHER *CHILLS OR SWEATING *NAUSEA AND VOMITING THAT IS NOT CONTROLLED WITH YOUR NAUSEA MEDICATION *UNUSUAL SHORTNESS OF BREATH *UNUSUAL BRUISING OR BLEEDING *URINARY PROBLEMS (pain or burning when urinating, or frequent urination) *BOWEL PROBLEMS (unusual diarrhea, constipation, pain near the anus) TENDERNESS IN MOUTH AND THROAT WITH OR WITHOUT PRESENCE OF ULCERS (sore throat, sores in mouth, or a toothache) UNUSUAL RASH, SWELLING OR PAIN  UNUSUAL VAGINAL DISCHARGE OR ITCHING   Items with * indicate a potential emergency and should be followed up as soon as possible or go to the Emergency Department if any problems should occur.  Please show the CHEMOTHERAPY ALERT CARD or  IMMUNOTHERAPY ALERT CARD at check-in to the Emergency Department and triage nurse. Should you have questions after your visit or need to cancel or reschedule your appointment, please contact [facility] - A DEPT OF [facility]  [phone] and follow the prompts.  Office hours are 8:00 a.m. to 4:30 p.m. Monday - Friday. Please note that voicemails left after 4:00 p.m. may not be returned until the following business day.  We are closed weekends and major holidays. You have access to a nurse at all times for urgent questions. Please call the main number to the clinic [phone] and follow the prompts.  For any non-urgent questions, you may also contact your provider using MyChart. We now offer e-Visits for anyone 30 and older to request care online for non-urgent symptoms. For details visit [email or web address].   Also download the MyChart app! Go to the app store, search "MyChart", open the app, select [place], and log in with your MyChart username and password.

## 2023-06-26 ENCOUNTER — Inpatient Hospital Stay: Payer: Managed Care, Other (non HMO)

## 2023-06-26 VITALS — BP 158/83 | HR 98 | Temp 98.9°F | Resp 19

## 2023-06-26 DIAGNOSIS — Z5111 Encounter for antineoplastic chemotherapy: Secondary | ICD-10-CM | POA: Diagnosis not present

## 2023-06-26 DIAGNOSIS — C6291 Malignant neoplasm of right testis, unspecified whether descended or undescended: Secondary | ICD-10-CM

## 2023-06-26 MED ORDER — MAGNESIUM SULFATE 2 GM/50ML IV SOLN
2.0000 g | Freq: Once | INTRAVENOUS | Status: AC
Start: 2023-06-26 — End: 2023-06-26
  Administered 2023-06-26: 2 g via INTRAVENOUS
  Filled 2023-06-26: qty 50

## 2023-06-26 MED ORDER — SODIUM CHLORIDE 0.9 % IV SOLN
150.0000 mg | Freq: Once | INTRAVENOUS | Status: AC
  Administered 2023-06-26: 150 mg via INTRAVENOUS
  Filled 2023-06-26: qty 150

## 2023-06-26 MED ORDER — POTASSIUM CHLORIDE IN NACL 20-0.9 MEQ/L-% IV SOLN
Freq: Once | INTRAVENOUS | Status: AC
Start: 2023-06-26 — End: 2023-06-26
  Filled 2023-06-26: qty 1000

## 2023-06-26 MED ORDER — ETOPOSIDE CHEMO INJECTION 1 GM/50ML
100.0000 mg/m2 | Freq: Once | INTRAVENOUS | Status: AC
  Administered 2023-06-26: 222 mg via INTRAVENOUS
  Filled 2023-06-26: qty 11.1

## 2023-06-26 MED ORDER — DEXAMETHASONE SODIUM PHOSPHATE 10 MG/ML IJ SOLN
10.0000 mg | Freq: Once | INTRAMUSCULAR | Status: AC
Start: 2023-06-26 — End: 2023-06-26
  Administered 2023-06-26: 10 mg via INTRAVENOUS
  Filled 2023-06-26: qty 1

## 2023-06-26 MED ORDER — SODIUM CHLORIDE 0.9% FLUSH
10.0000 mL | INTRAVENOUS | Status: DC | PRN
  Administered 2023-06-26: 10 mL

## 2023-06-26 MED ORDER — HEPARIN SOD (PORK) LOCK FLUSH 100 UNIT/ML IV SOLN
500.0000 [IU] | Freq: Once | INTRAVENOUS | Status: AC | PRN
Start: 2023-06-26 — End: 2023-06-26
  Administered 2023-06-26: 500 [IU]

## 2023-06-26 MED ORDER — PALONOSETRON HCL INJECTION 0.25 MG/5ML
0.2500 mg | Freq: Once | INTRAVENOUS | Status: AC
  Administered 2023-06-26: 0.25 mg via INTRAVENOUS
  Filled 2023-06-26: qty 5

## 2023-06-26 MED ORDER — SODIUM CHLORIDE 0.9 % IV SOLN
INTRAVENOUS | Status: DC
Start: 2023-06-26 — End: 2023-12-09

## 2023-06-26 MED ORDER — SODIUM CHLORIDE 0.9 % IV SOLN
20.0000 mg/m2 | Freq: Once | INTRAVENOUS | Status: AC
  Administered 2023-06-26: 44 mg via INTRAVENOUS
  Filled 2023-06-26: qty 44

## 2023-06-26 NOTE — Progress Notes (Unsigned)
 Ok to treat with high blood pressure per dR Ennever

## 2023-06-26 NOTE — Patient Instructions (Signed)
 [facility] - A DEPT OF [facility]  Discharge Instructions: Thank you for choosing [facility] to provide your oncology and hematology care.   If you have a lab appointment with the Cancer Center, please go directly to the Cancer Center and check in at the registration area.  Wear comfortable clothing and clothing appropriate for easy access to any Portacath or PICC line.   We strive to give you quality time with your provider. You may need to reschedule your appointment if you arrive late (15 or more minutes).  Arriving late affects you and other patients whose appointments are after yours.  Also, if you miss three or more appointments without notifying the office, you may be dismissed from the clinic at the provider's discretion.      For prescription refill requests, have your pharmacy contact our office and allow 72 hours for refills to be completed.    Today you received the following chemotherapy and/or immunotherapy agents Cisplatin, Etoposide      To help prevent nausea and vomiting after your treatment, we encourage you to take your nausea medication as directed.  BELOW ARE SYMPTOMS THAT SHOULD BE REPORTED IMMEDIATELY: *FEVER GREATER THAN 100.4 F (38 C) OR HIGHER *CHILLS OR SWEATING *NAUSEA AND VOMITING THAT IS NOT CONTROLLED WITH YOUR NAUSEA MEDICATION *UNUSUAL SHORTNESS OF BREATH *UNUSUAL BRUISING OR BLEEDING *URINARY PROBLEMS (pain or burning when urinating, or frequent urination) *BOWEL PROBLEMS (unusual diarrhea, constipation, pain near the anus) TENDERNESS IN MOUTH AND THROAT WITH OR WITHOUT PRESENCE OF ULCERS (sore throat, sores in mouth, or a toothache) UNUSUAL RASH, SWELLING OR PAIN  UNUSUAL VAGINAL DISCHARGE OR ITCHING   Items with * indicate a potential emergency and should be followed up as soon as possible or go to the Emergency Department if any problems should occur.  Please show the CHEMOTHERAPY ALERT CARD or  IMMUNOTHERAPY ALERT CARD at check-in to the Emergency Department and triage nurse. Should you have questions after your visit or need to cancel or reschedule your appointment, please contact [facility] - A DEPT OF [facility]  [phone] and follow the prompts.  Office hours are 8:00 a.m. to 4:30 p.m. Monday - Friday. Please note that voicemails left after 4:00 p.m. may not be returned until the following business day.  We are closed weekends and major holidays. You have access to a nurse at all times for urgent questions. Please call the main number to the clinic [phone] and follow the prompts.  For any non-urgent questions, you may also contact your provider using MyChart. We now offer e-Visits for anyone 31 and older to request care online for non-urgent symptoms. For details visit [email or web address].   Also download the MyChart app! Go to the app store, search "MyChart", open the app, select [place], and log in with your MyChart username and password.

## 2023-06-29 ENCOUNTER — Inpatient Hospital Stay: Payer: Managed Care, Other (non HMO)

## 2023-06-29 NOTE — Progress Notes (Signed)
 [facility] Clinical Social Work  Initial Assessment   Daniel Shepherd is a 28 y.o. year old male contacted by phone. Clinical Social Work was referred by nurse navigator for assessment of psychosocial needs.   SDOH (Social Determinants of Health) assessments performed: Yes   SDOH Screenings   Food Insecurity: No Food Insecurity (06/03/2023)   Received from [facility]  Housing: Low Risk  (06/03/2023)   Received from [facility]  Transportation Needs: No Transportation Needs (06/03/2023)   Received from [facility]  Utilities: Not At Risk (06/03/2023)   Received from [facility]  Financial Resource Strain: Low Risk  (06/03/2023)   Received from [facility]  Social Connections: Unknown (10/25/2021)   Received from [facility]  Tobacco Use: Low Risk  (06/09/2023)     Distress Screen completed: No     No data to display            Family/Social Information:  Housing Arrangement: patient lives with his wife and young children. Family members/support persons in your life? 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  His wife is a CMA at a hospital. Income source: Supported by [facility] and Friends Financial concerns: Yes, due to illness and/or loss of work during treatment Type of concern: Designer, Industrial/product access concerns: yes Religious or spiritual practice: No Services Currently in place:  Medicaid  Coping/ Adjustment to diagnosis: Patient understands treatment plan and what happens next? yes Concerns about diagnosis and/or treatment: Losing my job and/or losing income Patient reported stressors: Therapist, Art and/or priorities: Family Patient enjoys time with family/ friends Current coping skills/ strengths: Microbiologist of independent living , Manufacturing systems engineer , Radio Producer fund of knowledge , Motivation for treatment/growth , and Supportive family/friends     SUMMARY: Current SDOH Barriers:   Financial constraints related to loss of income.  Clinical Social Work Clinical Goal(s):  Explore community resource options for unmet needs related to:  Financial Strain   Interventions: Discussed common feeling and emotions when being diagnosed with cancer, and the importance of support during treatment Informed patient of the support team roles and support services at [facility] Provided CSW contact information and encouraged patient to call with any questions or concerns Provided patient with information about the [employer].  He currently does not qualify.  Encouraged him to contact DSS and apply for food stamps.  He would also like to utilize the massage certificates.  CSW to mail, along with program literature.   Follow Up Plan: CSW will follow-up with patient by phone  Patient verbalizes understanding of plan: Yes    Macario CHRISTELLA Au, LCSW Clinical Social Worker [facility]  Patient is participating in a Managed Medicaid Plan:  Yes

## 2023-06-30 ENCOUNTER — Inpatient Hospital Stay: Payer: Managed Care, Other (non HMO)

## 2023-06-30 ENCOUNTER — Other Ambulatory Visit: Payer: Self-pay | Admitting: *Deleted

## 2023-06-30 VITALS — BP 149/78 | HR 69 | Resp 17

## 2023-06-30 DIAGNOSIS — C6291 Malignant neoplasm of right testis, unspecified whether descended or undescended: Secondary | ICD-10-CM

## 2023-06-30 DIAGNOSIS — C6292 Malignant neoplasm of left testis, unspecified whether descended or undescended: Secondary | ICD-10-CM

## 2023-06-30 DIAGNOSIS — Z5111 Encounter for antineoplastic chemotherapy: Secondary | ICD-10-CM | POA: Diagnosis not present

## 2023-06-30 LAB — CBC WITH DIFFERENTIAL (CANCER CENTER ONLY)
Abs Immature Granulocytes: 0.07 10*3/uL (ref 0.00–0.07)
Basophils Absolute: 0 10*3/uL (ref 0.0–0.1)
Basophils Relative: 0 %
Eosinophils Absolute: 0 10*3/uL (ref 0.0–0.5)
Eosinophils Relative: 0 %
HCT: 31.3 % — ABNORMAL LOW (ref 39.0–52.0)
Hemoglobin: 10.7 g/dL — ABNORMAL LOW (ref 13.0–17.0)
Immature Granulocytes: 1 %
Lymphocytes Relative: 9 %
Lymphs Abs: 0.5 10*3/uL — ABNORMAL LOW (ref 0.7–4.0)
MCH: 29.3 pg (ref 26.0–34.0)
MCHC: 34.2 g/dL (ref 30.0–36.0)
MCV: 85.8 fL (ref 80.0–100.0)
Monocytes Absolute: 0 10*3/uL — ABNORMAL LOW (ref 0.1–1.0)
Monocytes Relative: 0 %
Neutro Abs: 4.9 10*3/uL (ref 1.7–7.7)
Neutrophils Relative %: 90 %
Platelet Count: 299 10*3/uL (ref 150–400)
RBC: 3.65 MIL/uL — ABNORMAL LOW (ref 4.22–5.81)
RDW: 11.1 % — ABNORMAL LOW (ref 11.5–15.5)
Smear Review: NORMAL
WBC Count: 5.5 10*3/uL (ref 4.0–10.5)
nRBC: 0 % (ref 0.0–0.2)

## 2023-06-30 LAB — CMP (CANCER CENTER ONLY)
ALT: 23 U/L (ref 0–44)
AST: 14 U/L — ABNORMAL LOW (ref 15–41)
Albumin: 4.2 g/dL (ref 3.5–5.0)
Alkaline Phosphatase: 118 U/L (ref 38–126)
Anion gap: 8 (ref 5–15)
BUN: 21 mg/dL — ABNORMAL HIGH (ref 6–20)
CO2: 29 mmol/L (ref 22–32)
Calcium: 9 mg/dL (ref 8.9–10.3)
Chloride: 96 mmol/L — ABNORMAL LOW (ref 98–111)
Creatinine: 0.98 mg/dL (ref 0.61–1.24)
GFR, Estimated: >60 mL/min (ref >60)
Glucose, Bld: 111 mg/dL — ABNORMAL HIGH (ref 70–99)
Potassium: 3.9 mmol/L (ref 3.5–5.1)
Sodium: 133 mmol/L — ABNORMAL LOW (ref 135–145)
Total Bilirubin: 0.4 mg/dL (ref 0.0–1.2)
Total Protein: 7.1 g/dL (ref 6.5–8.1)

## 2023-06-30 LAB — MAGNESIUM: Magnesium: 1.7 mg/dL (ref 1.7–2.4)

## 2023-06-30 MED ORDER — SODIUM CHLORIDE 0.9% FLUSH
10.0000 mL | INTRAVENOUS | Status: DC | PRN
  Administered 2023-06-30: 10 mL

## 2023-06-30 MED ORDER — PROCHLORPERAZINE MALEATE 10 MG PO TABS: 10.0000 mg | ORAL_TABLET | Freq: Once | ORAL | Status: DC

## 2023-06-30 MED ORDER — SODIUM CHLORIDE 0.9 % IV SOLN
30.0000 [IU] | Freq: Once | INTRAVENOUS | Status: AC
  Administered 2023-06-30: 30 [IU] via INTRAVENOUS
  Filled 2023-06-30: qty 10

## 2023-06-30 MED ORDER — SODIUM CHLORIDE 0.9 % IV SOLN: INTRAVENOUS | Status: DC

## 2023-06-30 MED ORDER — HEPARIN SOD (PORK) LOCK FLUSH 100 UNIT/ML IV SOLN
500.0000 [IU] | Freq: Once | INTRAVENOUS | Status: AC | PRN
  Administered 2023-06-30: 500 [IU]

## 2023-06-30 NOTE — Patient Instructions (Signed)

## 2023-06-30 NOTE — Patient Instructions (Addendum)
 [facility] - A DEPT OF [place]  Discharge Instructions: Thank you for choosing [facility] to provide your oncology and hematology care.   If you have a lab appointment with the Cancer Center, please go directly to the Cancer Center and check in at the registration area.  Wear comfortable clothing and clothing appropriate for easy access to any Portacath or PICC line.   We strive to give you quality time with your provider. You may need to reschedule your appointment if you arrive late (15 or more minutes).  Arriving late affects you and other patients whose appointments are after yours.  Also, if you miss three or more appointments without notifying the office, you may be dismissed from the clinic at the provider's discretion.      For prescription refill requests, have your pharmacy contact our office and allow 72 hours for refills to be completed.    Today you received the following chemotherapy and/or immunotherapy agents Bleomyocin  To help prevent nausea and vomiting after your treatment, we encourage you to take your nausea medication as directed.  BELOW ARE SYMPTOMS THAT SHOULD BE REPORTED IMMEDIATELY: *FEVER GREATER THAN 100.4 F (38 C) OR HIGHER *CHILLS OR SWEATING *NAUSEA AND VOMITING THAT IS NOT CONTROLLED WITH YOUR NAUSEA MEDICATION *UNUSUAL SHORTNESS OF BREATH *UNUSUAL BRUISING OR BLEEDING *URINARY PROBLEMS (pain or burning when urinating, or frequent urination) *BOWEL PROBLEMS (unusual diarrhea, constipation, pain near the anus) TENDERNESS IN MOUTH AND THROAT WITH OR WITHOUT PRESENCE OF ULCERS (sore throat, sores in mouth, or a toothache) UNUSUAL RASH, SWELLING OR PAIN  UNUSUAL VAGINAL DISCHARGE OR ITCHING   Items with * indicate a potential emergency and should be followed up as soon as possible or go to the Emergency Department if any problems should occur.  Please show the CHEMOTHERAPY ALERT CARD or IMMUNOTHERAPY ALERT  CARD at check-in to the Emergency Department and triage nurse. Should you have questions after your visit or need to cancel or reschedule your appointment, please contact [facility] - A DEPT OF [facility]  [phone] and follow the prompts.  Office hours are 8:00 a.m. to 4:30 p.m. Monday - Friday. Please note that voicemails left after 4:00 p.m. may not be returned until the following business day.  We are closed weekends and major holidays. You have access to a nurse at all times for urgent questions. Please call the main number to the clinic [phone] and follow the prompts.  For any non-urgent questions, you may also contact your provider using MyChart. We now offer e-Visits for anyone 36 and older to request care online for non-urgent symptoms. For details visit [email or web address].   Also download the MyChart app! Go to the app store, search MyChart, open the app, select [place], and log in with your MyChart username and password.

## 2023-07-01 ENCOUNTER — Telehealth: Payer: Self-pay | Admitting: Dietician

## 2023-07-01 ENCOUNTER — Inpatient Hospital Stay: Payer: Managed Care, Other (non HMO) | Admitting: Dietician

## 2023-07-01 NOTE — Telephone Encounter (Signed)
 Attempted to reach patient for a scheduled remote nutrition consult. Provided my cell# on voice mail to return call for his nutrition consult.  Gennaro Africa, RDN, LDN Registered Dietitian, [facility] Part Time Remote (Usual office hours: Tuesday-Thursday) Cell: [phone]

## 2023-07-01 NOTE — Progress Notes (Signed)
 Nutrition Assessment: Patient responded to message left.  Mobile# is best number to reach, home# is wife cell.  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  He is undergoing new treatment of BEP q 21 days and being followed by Dr. Maria Shiner.  He expressed concern about weight loss as he lost 40# during his first experience with chemo and it has taken a long time to get his weight and strength up.  He lost another 10# last fall and would like to rebuild his strength and muscle as he is involved in [employer].  He also works Holiday representative so an active lifestyle.  He reports no NKFA was using intermittent fasting to reduce risk of future cancers.  Usual pattern lunch and dinner.  Will sometimes eat breakfast of cereal with 1% milk. Tries to follow a high protein diet as well.  Spouse cooks mostly.  He has been having some high blood pressure readings at cancer center and is going today to pick up meds to address that.  He has had some sore throat pain and fatigue since he started new treatment but no nausea or anything to inhibit eating. Usual intake: Lunch: fast food or leftovers. Dinners: Salmon, asparagus with corn, citrus or Steaks and chicken breast, loves lots of vegetables.  Fluids: Water, juice, sodas, sweet tea. Protein shakes (chocolate muscle milk) .  Nutrition Focused Physical Exam: unable to perform NFPE   Medications: "Doretta Gant of vitamins" he did check to make sure he's getting Vitamin D   Labs: 06/29/22  Na 133, Hgb 10.7   Anthropometrics: wants to regain recent 10# weight loss   Height: 74" Weight: 210# DBW: 220# BMI: 26.96   Estimated Energy Needs  Kcals: 2900-3400 Protein: 115-144 g Fluid: 3 L   NUTRITION DIAGNOSIS:  Inadequate PO intake to meet increased nutrient needs r/t cancer and treatments   INTERVENTION:   Relayed that nutrition services are wrap around service provided at no charge and encouraged continued communication if experiencing any nutritional impact symptoms (NIS). Educated on importance of adequate nourishment with calorie and protein energy intake with nutrient dense foods when possible to maintain weight/strength and QOL.   Encouraged high protein foods as well as more frequent meals/snacks -  Suggested oral nutrition supplement at breakfast meal as this is very light and not enough protein. Discussed strategies for anemia and encouraged beef 3 times a week Emailed Nutrition Tip sheet  for  High Protein Snacking, and AICR risk reduction infographic to gmail with contact information provided.  MONITORING, EVALUATION, GOAL: weight trends, nutrition impact symptoms, PO intake, labs  Goal is increase in LBM and weight gain.  Next Visit: remote during infusion  Carleen Chary, RDN, LDN Registered Dietitian, [facility] Part Time Remote (Usual office hours: Tuesday-Thursday) Mobile: [phone]

## 2023-07-08 ENCOUNTER — Other Ambulatory Visit: Payer: Self-pay | Admitting: *Deleted

## 2023-07-08 ENCOUNTER — Telehealth: Payer: Self-pay | Admitting: *Deleted

## 2023-07-08 ENCOUNTER — Encounter: Payer: Self-pay | Admitting: Hematology & Oncology

## 2023-07-08 ENCOUNTER — Inpatient Hospital Stay: Payer: Managed Care, Other (non HMO)

## 2023-07-08 ENCOUNTER — Inpatient Hospital Stay: Payer: Managed Care, Other (non HMO) | Admitting: Licensed Clinical Social Worker

## 2023-07-08 VITALS — BP 130/81 | HR 69

## 2023-07-08 DIAGNOSIS — C6291 Malignant neoplasm of right testis, unspecified whether descended or undescended: Secondary | ICD-10-CM

## 2023-07-08 DIAGNOSIS — Z5111 Encounter for antineoplastic chemotherapy: Secondary | ICD-10-CM | POA: Diagnosis not present

## 2023-07-08 LAB — CMP (CANCER CENTER ONLY)
ALT: 19 U/L (ref 0–44)
AST: 14 U/L — ABNORMAL LOW (ref 15–41)
Albumin: 4.2 g/dL (ref 3.5–5.0)
Alkaline Phosphatase: 87 U/L (ref 38–126)
Anion gap: 5 (ref 5–15)
BUN: 19 mg/dL (ref 6–20)
CO2: 30 mmol/L (ref 22–32)
Calcium: 8.9 mg/dL (ref 8.9–10.3)
Chloride: 104 mmol/L (ref 98–111)
Creatinine: 0.89 mg/dL (ref 0.61–1.24)
GFR, Estimated: >60 mL/min (ref >60)
Glucose, Bld: 87 mg/dL (ref 70–99)
Potassium: 4.1 mmol/L (ref 3.5–5.1)
Sodium: 139 mmol/L (ref 135–145)
Total Bilirubin: 0.3 mg/dL (ref 0.0–1.2)
Total Protein: 6.6 g/dL (ref 6.5–8.1)

## 2023-07-08 LAB — CBC WITH DIFFERENTIAL (CANCER CENTER ONLY)
Abs Immature Granulocytes: 0 10*3/uL (ref 0.00–0.07)
Basophils Absolute: 0 10*3/uL (ref 0.0–0.1)
Basophils Relative: 2 %
Eosinophils Absolute: 0 10*3/uL (ref 0.0–0.5)
Eosinophils Relative: 2 %
HCT: 31 % — ABNORMAL LOW (ref 39.0–52.0)
Hemoglobin: 10.4 g/dL — ABNORMAL LOW (ref 13.0–17.0)
Immature Granulocytes: 0 %
Lymphocytes Relative: 64 %
Lymphs Abs: 0.8 10*3/uL (ref 0.7–4.0)
MCH: 30 pg (ref 26.0–34.0)
MCHC: 33.5 g/dL (ref 30.0–36.0)
MCV: 89.3 fL (ref 80.0–100.0)
Monocytes Absolute: 0.2 10*3/uL (ref 0.1–1.0)
Monocytes Relative: 17 %
Neutro Abs: 0.2 10*3/uL — CL (ref 1.7–7.7)
Neutrophils Relative %: 15 %
Platelet Count: 124 10*3/uL — ABNORMAL LOW (ref 150–400)
RBC: 3.47 MIL/uL — ABNORMAL LOW (ref 4.22–5.81)
RDW: 12.9 % (ref 11.5–15.5)
Smear Review: NORMAL
WBC Count: 1.2 10*3/uL — ABNORMAL LOW (ref 4.0–10.5)
nRBC: 0 % (ref 0.0–0.2)

## 2023-07-08 MED ORDER — HEPARIN SOD (PORK) LOCK FLUSH 100 UNIT/ML IV SOLN
500.0000 [IU] | Freq: Once | INTRAVENOUS | Status: AC | PRN
  Administered 2023-07-08: 500 [IU]

## 2023-07-08 MED ORDER — PROCHLORPERAZINE MALEATE 10 MG PO TABS: 10.0000 mg | ORAL_TABLET | Freq: Once | ORAL | Status: DC

## 2023-07-08 MED ORDER — SODIUM CHLORIDE 0.9 % IV SOLN
30.0000 [IU] | Freq: Once | INTRAVENOUS | Status: AC
  Administered 2023-07-08: 30 [IU] via INTRAVENOUS
  Filled 2023-07-08: qty 10

## 2023-07-08 MED ORDER — SODIUM CHLORIDE 0.9% FLUSH
10.0000 mL | INTRAVENOUS | Status: DC | PRN
Start: 2023-07-08 — End: 2023-07-08
  Administered 2023-07-08: 10 mL

## 2023-07-08 MED ORDER — SODIUM CHLORIDE 0.9 % IV SOLN
INTRAVENOUS | Status: DC
Start: 2023-07-08 — End: 2023-07-08

## 2023-07-08 MED ORDER — LEVOFLOXACIN 500 MG PO TABS: 500.0000 mg | ORAL_TABLET | Freq: Every day | ORAL | 1 refills | Status: DC

## 2023-07-08 NOTE — Patient Instructions (Signed)
[facility]  Discharge Instructions: Thank you for choosing [facility] to provide your oncology and hematology care.   If you have a lab appointment with the Cancer Center, please go directly to the Cancer Center and check in at the registration area.  Wear comfortable clothing and clothing appropriate for easy access to any Portacath or PICC line.   We strive to give you quality time with your provider. You may need to reschedule your appointment if you arrive late (15 or more minutes).  Arriving late affects you and other patients whose appointments are after yours.  Also, if you miss three or more appointments without notifying the office, you may be dismissed from the clinic at the provider's discretion.      For prescription refill requests, have your pharmacy contact our office and allow 72 hours for refills to be completed.    Today you received the following chemotherapy and/or immunotherapy agents: Bleomycin      To help prevent nausea and vomiting after your treatment, we encourage you to take your nausea medication as directed.  BELOW ARE SYMPTOMS THAT SHOULD BE REPORTED IMMEDIATELY: *FEVER GREATER THAN 100.4 F (38 C) OR HIGHER *CHILLS OR SWEATING *NAUSEA AND VOMITING THAT IS NOT CONTROLLED WITH YOUR NAUSEA MEDICATION *UNUSUAL SHORTNESS OF BREATH *UNUSUAL BRUISING OR BLEEDING *URINARY PROBLEMS (pain or burning when urinating, or frequent urination) *BOWEL PROBLEMS (unusual diarrhea, constipation, pain near the anus) TENDERNESS IN MOUTH AND THROAT WITH OR WITHOUT PRESENCE OF ULCERS (sore throat, sores in mouth, or a toothache) UNUSUAL RASH, SWELLING OR PAIN  UNUSUAL VAGINAL DISCHARGE OR ITCHING   Items with * indicate a potential emergency and should be followed up as soon as possible or go to the Emergency Department if any problems should occur.  Please show the CHEMOTHERAPY ALERT CARD or IMMUNOTHERAPY  ALERT CARD at check-in to the Emergency Department and triage nurse. Should you have questions after your visit or need to cancel or reschedule your appointment, please contact [facility] - A DEPT OF [facility] Brideg[facility]  [phone] and follow the prompts.  Office hours are 8:00 a.m. to 4:30 p.m. Monday - Friday. Please note that voicemails left after 4:00 p.m. may not be returned until the following business day.  We are closed weekends and major holidays. You have access to a nurse at all times for urgent questions. Please call the main number to the clinic [phone] and follow the prompts.  For any non-urgent questions, you may also contact your provider using MyChart. We now offer e-Visits for anyone 43 and older to request care online for non-urgent symptoms. For details visit [email or web address].   Also download the MyChart app! Go to the app store, search "MyChart", open the app, select [place], and log in with your MyChart username and password.

## 2023-07-08 NOTE — Progress Notes (Signed)
[facility] CSW Progress Note  Clinical Child psychotherapist contacted patient by phone to discuss disability referral.  He stated he had not received communication from the [employer].  CSW securely emailed patient the contact information at the [facility] and he said he would follow up with them.  He stated his chemo therapy went well this morning.    Dorothey Baseman, LCSW Clinical Social Worker [facility]

## 2023-07-08 NOTE — Telephone Encounter (Signed)
Gery Pray from lab brought to my attention a panic lab ANC of 0.2. Results given to MD.

## 2023-07-08 NOTE — Progress Notes (Signed)
Per Dr. Myna Hidalgo, it's OK to treat with today's lab values. Infusion room nurse notified.

## 2023-07-12 ENCOUNTER — Emergency Department (HOSPITAL_BASED_OUTPATIENT_CLINIC_OR_DEPARTMENT_OTHER): Payer: Managed Care, Other (non HMO)

## 2023-07-12 ENCOUNTER — Other Ambulatory Visit: Payer: Self-pay

## 2023-07-12 ENCOUNTER — Inpatient Hospital Stay (HOSPITAL_BASED_OUTPATIENT_CLINIC_OR_DEPARTMENT_OTHER)
Admission: EM | Admit: 2023-07-12 | Discharge: 2023-07-15 | DRG: 300 | Disposition: A | Discharge status: Home / Self Care | Payer: Managed Care, Other (non HMO) | Attending: Family Medicine | Admitting: Family Medicine

## 2023-07-12 ENCOUNTER — Encounter (HOSPITAL_BASED_OUTPATIENT_CLINIC_OR_DEPARTMENT_OTHER): Payer: Self-pay

## 2023-07-12 DIAGNOSIS — K625 Hemorrhage of anus and rectum: Secondary | ICD-10-CM | POA: Diagnosis present

## 2023-07-12 DIAGNOSIS — Z9079 Acquired absence of other genital organ(s): Secondary | ICD-10-CM

## 2023-07-12 DIAGNOSIS — D500 Iron deficiency anemia secondary to blood loss (chronic): Secondary | ICD-10-CM | POA: Diagnosis present

## 2023-07-12 DIAGNOSIS — Z9221 Personal history of antineoplastic chemotherapy: Secondary | ICD-10-CM

## 2023-07-12 DIAGNOSIS — C779 Secondary and unspecified malignant neoplasm of lymph node, unspecified: Secondary | ICD-10-CM | POA: Diagnosis present

## 2023-07-12 DIAGNOSIS — R1909 Other intra-abdominal and pelvic swelling, mass and lump: Secondary | ICD-10-CM | POA: Diagnosis not present

## 2023-07-12 DIAGNOSIS — I8290 Acute embolism and thrombosis of unspecified vein: Principal | ICD-10-CM | POA: Diagnosis present

## 2023-07-12 DIAGNOSIS — I82890 Acute embolism and thrombosis of other specified veins: Secondary | ICD-10-CM | POA: Diagnosis not present

## 2023-07-12 DIAGNOSIS — D649 Anemia, unspecified: Secondary | ICD-10-CM | POA: Diagnosis present

## 2023-07-12 DIAGNOSIS — I10 Essential (primary) hypertension: Secondary | ICD-10-CM | POA: Diagnosis present

## 2023-07-12 DIAGNOSIS — D709 Neutropenia, unspecified: Secondary | ICD-10-CM | POA: Diagnosis present

## 2023-07-12 DIAGNOSIS — C6291 Malignant neoplasm of right testis, unspecified whether descended or undescended: Secondary | ICD-10-CM | POA: Diagnosis present

## 2023-07-12 DIAGNOSIS — Z79899 Other long term (current) drug therapy: Secondary | ICD-10-CM

## 2023-07-12 DIAGNOSIS — K5900 Constipation, unspecified: Secondary | ICD-10-CM | POA: Diagnosis present

## 2023-07-12 DIAGNOSIS — Z800 Family history of malignant neoplasm of digestive organs: Secondary | ICD-10-CM

## 2023-07-12 DIAGNOSIS — Z91030 Bee allergy status: Secondary | ICD-10-CM

## 2023-07-12 DIAGNOSIS — Z7901 Long term (current) use of anticoagulants: Secondary | ICD-10-CM

## 2023-07-12 DIAGNOSIS — Z803 Family history of malignant neoplasm of breast: Secondary | ICD-10-CM

## 2023-07-12 DIAGNOSIS — N1330 Unspecified hydronephrosis: Secondary | ICD-10-CM | POA: Diagnosis present

## 2023-07-12 DIAGNOSIS — Z85118 Personal history of other malignant neoplasm of bronchus and lung: Secondary | ICD-10-CM

## 2023-07-12 DIAGNOSIS — K921 Melena: Secondary | ICD-10-CM | POA: Diagnosis present

## 2023-07-12 DIAGNOSIS — E871 Hypo-osmolality and hyponatremia: Secondary | ICD-10-CM | POA: Diagnosis present

## 2023-07-12 DIAGNOSIS — N179 Acute kidney failure, unspecified: Secondary | ICD-10-CM | POA: Diagnosis present

## 2023-07-12 DIAGNOSIS — Z8489 Family history of other specified conditions: Secondary | ICD-10-CM

## 2023-07-12 LAB — CBC
HCT: 32.7 % — ABNORMAL LOW (ref 39.0–52.0)
Hemoglobin: 10.9 g/dL — ABNORMAL LOW (ref 13.0–17.0)
MCH: 29.9 pg (ref 26.0–34.0)
MCHC: 33.3 g/dL (ref 30.0–36.0)
MCV: 89.8 fL (ref 80.0–100.0)
Platelets: 175 10*3/uL (ref 150–400)
RBC: 3.64 MIL/uL — ABNORMAL LOW (ref 4.22–5.81)
RDW: 14 % (ref 11.5–15.5)
WBC: 2 10*3/uL — ABNORMAL LOW (ref 4.0–10.5)
nRBC: 0 % (ref 0.0–0.2)

## 2023-07-12 LAB — COMPREHENSIVE METABOLIC PANEL
ALT: 20 U/L (ref 0–44)
AST: 18 U/L (ref 15–41)
Albumin: 3.7 g/dL (ref 3.5–5.0)
Alkaline Phosphatase: 77 U/L (ref 38–126)
Anion gap: 7 (ref 5–15)
BUN: 14 mg/dL (ref 6–20)
CO2: 26 mmol/L (ref 22–32)
Calcium: 8.9 mg/dL (ref 8.9–10.3)
Chloride: 101 mmol/L (ref 98–111)
Creatinine, Ser: 0.97 mg/dL (ref 0.61–1.24)
GFR, Estimated: >60 mL/min (ref >60)
Glucose, Bld: 101 mg/dL — ABNORMAL HIGH (ref 70–99)
Potassium: 4 mmol/L (ref 3.5–5.1)
Sodium: 134 mmol/L — ABNORMAL LOW (ref 135–145)
Total Bilirubin: 0.5 mg/dL (ref 0.0–1.2)
Total Protein: 6.8 g/dL (ref 6.5–8.1)

## 2023-07-12 LAB — URINALYSIS, ROUTINE W REFLEX MICROSCOPIC
Bilirubin Urine: NEGATIVE
Glucose, UA: NEGATIVE mg/dL
Hgb urine dipstick: NEGATIVE
Ketones, ur: NEGATIVE mg/dL
Leukocytes,Ua: NEGATIVE
Nitrite: NEGATIVE
Protein, ur: NEGATIVE mg/dL
Specific Gravity, Urine: 1.015 (ref 1.005–1.030)
pH: 8 (ref 5.0–8.0)

## 2023-07-12 LAB — HEMOGLOBIN AND HEMATOCRIT, BLOOD
HCT: 32.4 % — ABNORMAL LOW (ref 39.0–52.0)
Hemoglobin: 10.7 g/dL — ABNORMAL LOW (ref 13.0–17.0)

## 2023-07-12 LAB — LIPASE, BLOOD: Lipase: 29 U/L (ref 11–51)

## 2023-07-12 MED ORDER — ACETAMINOPHEN 325 MG PO TABS: 650.0000 mg | ORAL_TABLET | Freq: Four times a day (QID) | ORAL | Status: DC | PRN

## 2023-07-12 MED ORDER — IOHEXOL 300 MG/ML  SOLN
100.0000 mL | Freq: Once | INTRAMUSCULAR | Status: AC | PRN
  Administered 2023-07-12: 100 mL via INTRAVENOUS

## 2023-07-12 MED ORDER — ONDANSETRON HCL 4 MG PO TABS: 4.0000 mg | ORAL_TABLET | Freq: Four times a day (QID) | ORAL | Status: DC | PRN

## 2023-07-12 MED ORDER — HEPARIN (PORCINE) 25000 UT/250ML-% IV SOLN
1750.0000 [IU]/h | INTRAVENOUS | Status: DC
  Administered 2023-07-12: 1350 [IU]/h via INTRAVENOUS
  Administered 2023-07-14 – 2023-07-15 (×3): 1750 [IU]/h via INTRAVENOUS
  Filled 2023-07-12 (×6): qty 250

## 2023-07-12 MED ORDER — LEVOFLOXACIN 500 MG PO TABS
500.0000 mg | ORAL_TABLET | Freq: Every day | ORAL | Status: DC
  Administered 2023-07-12 – 2023-07-13 (×2): 500 mg via ORAL
  Filled 2023-07-12 (×2): qty 1

## 2023-07-12 MED ORDER — HYDROCODONE-ACETAMINOPHEN 5-325 MG PO TABS: 1.0000 | ORAL_TABLET | Freq: Four times a day (QID) | ORAL | Status: DC | PRN

## 2023-07-12 MED ORDER — ACETAMINOPHEN 650 MG RE SUPP: 650.0000 mg | Freq: Four times a day (QID) | RECTAL | Status: DC | PRN

## 2023-07-12 MED ORDER — HEPARIN BOLUS VIA INFUSION
2000.0000 [IU] | Freq: Once | INTRAVENOUS | Status: AC
  Administered 2023-07-12: 2000 [IU] via INTRAVENOUS
  Filled 2023-07-12: qty 2000

## 2023-07-12 MED ORDER — ONDANSETRON HCL 4 MG/2ML IJ SOLN: 4.0000 mg | Freq: Four times a day (QID) | INTRAMUSCULAR | Status: DC | PRN

## 2023-07-12 NOTE — Plan of Care (Signed)

## 2023-07-12 NOTE — ED Notes (Signed)
ED TO INPATIENT HANDOFF REPORT  ED Nurse Name and Phone #: Sherilyn Dacosta Name/Age/Gender Monika Salk 28 y.o. male Room/Bed: MH06/MH06  Code Status   Code Status: Prior  Home/SNF/Other Home Patient oriented to: self, place, time, and situation Is this baseline? Yes   Triage Complete: Triage complete  Chief Complaint Right groin mass [R19.09]  Triage Note Blood in stool x1 this morning, lower abd pain, nasuea. Denies diarrhea or emesis.  Chemo patient    Allergies Allergies  Allergen Reactions   Bee Venom Anaphylaxis   Other Swelling    Bee allergy    Level of Care/Admitting Diagnosis ED Disposition     ED Disposition  Admit   Condition  --   Comment  Hospital Area: [facility] [[id number]]  Level of Care: Med-Surg [16]  Interfacility transfer: Yes  May place patient in observation at [facility] or [facility] if equivalent level of care is available:: No  Covid Evaluation: Asymptomatic - no recent exposure (last 10 days) testing not required  Diagnosis: Right groin mass [324401]  Admitting Physician: Bobette Mo [[id number]]  Attending Physician: Bobette Mo [[id number]]          B Medical/Surgery History 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   Testicular cancer [facility])    Past Surgical History:  Procedure Laterality Date   HYDROCELE EXCISION Left 07/03/2017   Procedure: HYDROCELECTOMY ADULT/ INGUINAL APPROACH/ TESTICULAR BIOPSY/ ORCHIECTOMY;  Surgeon: Ihor Gully, MD;  Location: [facility] [place];  Service: Urology;   Laterality: Left;   IR FLUORO GUIDE PORT INSERTION RIGHT  07/09/2017   IR IMAGING GUIDED PORT INSERTION  06/19/2023   IR REMOVAL TUN ACCESS W/ PORT W/O FL MOD SED  10/12/2017   IR US GUIDE VASC ACCESS RIGHT  07/09/2017   NO PAST SURGERIES     ORCHIECTOMY       A IV Location/Drains/Wounds Patient Lines/Drains/Airways Status     Active Line/Drains/Airways     Name Placement date Placement time Site Days   Implanted Port 06/19/23 Right Chest 06/19/23  1523  Chest  23   Peripheral IV 07/12/23 20 G 1" Left Antecubital 07/12/23  0844  Antecubital  less than 1            Intake/Output Last 24 hours No intake or output data in the 24 hours ending 07/12/23 1343  Labs/Imaging Results for orders placed or performed during the hospital encounter of 07/12/23 (from the past 48 hours)  Comprehensive metabolic panel     Status: Abnormal   Collection Time: 07/12/23  8:46 AM  Result Value Ref Range   Sodium 134 (L) 135 - 145 mmol/L   Potassium 4.0 3.5 - 5.1 mmol/L   Chloride 101 98 - 111 mmol/L   CO2 26 22 - 32 mmol/L   Glucose, Bld 101 (H) 70 - 99 mg/dL    Comment: Glucose reference range applies only to samples taken after fasting for at least 8 hours.   BUN 14 6 - 20 mg/dL   Creatinine, Ser 4.25 0.61 -  1.24 mg/dL   Calcium 8.9 8.9 - 16.1 mg/dL   Total Protein 6.8 6.5 - 8.1 g/dL   Albumin 3.7 3.5 - 5.0 g/dL   AST 18 15 - 41 U/L   ALT 20 0 - 44 U/L   Alkaline Phosphatase 77 38 - 126 U/L   Total Bilirubin 0.5 0.0 - 1.2 mg/dL   GFR, Estimated >09 >60 mL/min    Comment: (NOTE) Calculated using the CKD-EPI Creatinine Equation (2021)    Anion gap 7 5 - 15    Comment: Performed at [facility], [address] [place], [place] [address]  CBC     Status: Abnormal   Collection Time: 07/12/23  8:46 AM  Result Value Ref Range   WBC 2.0 (L) 4.0 - 10.5 K/uL   RBC 3.64 (L) 4.22 - 5.81 MIL/uL   Hemoglobin 10.9 (L) 13.0 - 17.0 g/dL   HCT 81.1 (L) 91.4 - 78.2 %   MCV 89.8 80.0 -  100.0 fL   MCH 29.9 26.0 - 34.0 pg   MCHC 33.3 30.0 - 36.0 g/dL   RDW 95.6 21.3 - 08.6 %   Platelets 175 150 - 400 K/uL   nRBC 0.0 0.0 - 0.2 %    Comment: Performed at [facility], [address] [place], [place] [address]  Lipase, blood     Status: None   Collection Time: 07/12/23  8:46 AM  Result Value Ref Range   Lipase 29 11 - 51 U/L    Comment: Performed at [facility], [address] [place], [place] [address]  Urinalysis, Routine w reflex microscopic -Urine, Clean Catch     Status: Abnormal   Collection Time: 07/12/23 11:19 AM  Result Value Ref Range   Color, Urine STRAW (A) YELLOW   APPearance CLEAR CLEAR   Specific Gravity, Urine 1.015 1.005 - 1.030   pH 8.0 5.0 - 8.0   Glucose, UA NEGATIVE NEGATIVE mg/dL   Hgb urine dipstick NEGATIVE NEGATIVE   Bilirubin Urine NEGATIVE NEGATIVE   Ketones, ur NEGATIVE NEGATIVE mg/dL   Protein, ur NEGATIVE NEGATIVE mg/dL   Nitrite NEGATIVE NEGATIVE   Leukocytes,Ua NEGATIVE NEGATIVE    Comment: Microscopic not done on urines with negative protein, blood, leukocytes, nitrite, or glucose < 500 mg/dL. Performed at [facility], [address] [place], [place] [address]    CT ABDOMEN PELVIS W CONTRAST Result Date: 07/12/2023 CLINICAL DATA:  Abdominal pain, acute, nonlocalized. History of testicular carcinoma. * Tracking Code: BO * EXAM: CT ABDOMEN AND PELVIS WITH CONTRAST TECHNIQUE: Multidetector CT imaging of the abdomen and pelvis was performed using the standard protocol following bolus administration of intravenous contrast. RADIATION DOSE REDUCTION: This exam was performed according to the departmental dose-optimization program which includes automated exposure control, adjustment of the mA and/or kV according to patient size and/or use of iterative reconstruction technique. CONTRAST:  OMNIPAQUE IOHEXOL 300 MG/ML  SOLN COMPARISON:  CT scan abdomen and pelvis from 10/23/2022. FINDINGS: Lower chest: The  lung bases are clear. No pleural effusion. The heart is normal in size. No pericardial effusion. Hepatobiliary: The liver is normal in size. Non-cirrhotic configuration. No suspicious mass. There is a 5 mm hypoattenuating focus in the left hepatic dome (series 301, image 12), which is too small to adequately characterize but appears unchanged since the prior study. No intrahepatic or extrahepatic bile duct dilation. No calcified gallstones. Normal gallbladder wall thickness. No pericholecystic inflammatory changes.  Pancreas: Unremarkable. No pancreatic ductal dilatation or surrounding inflammatory changes. Spleen: Within normal limits. No focal lesion. Adrenals/Urinary Tract: Adrenal glands are unremarkable. No suspicious renal mass within the limitations of this unenhanced exam. There is new moderate right hydronephrosis secondary to an elongated soft tissue mass compressing the right upper/mid ureter, described in detail below. Right lower ureter is unremarkable. No left hydroureteronephrosis. No nephroureterolithiasis on either side. Unremarkable urinary bladder. Stomach/Bowel: No disproportionate dilation of the small or large bowel loops. No evidence of abnormal bowel wall thickening or inflammatory changes. The appendix was not visualized; however there is no acute inflammatory process in the right lower quadrant. Vascular/Lymphatic: No ascites or pneumoperitoneum. There is elongated soft tissue extending from the level of deep inguinal canal reaching up to the inferior vena cava along the course of the right gonadal vein, inseparable from the anterior surface of the right psoas muscle, highly concerning for tumor thrombus of gonadal vein/metastatic lymphadenopathy in this patient with known history of testicular carcinoma. No aneurysmal dilation of the major abdominal arteries. 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 Reproductive: There are postsurgical changes in the right inguinal region from  prior orchiectomy. Partially seen testicular prosthesis. Right spermatic cord is also not seen and likely surgically absent. Correlate with surgical history. Other: There is a tiny fat containing umbilical hernia. The soft tissues and abdominal wall are otherwise unremarkable. Musculoskeletal: No suspicious osseous lesions. IMPRESSION: 1. There is elongated soft tissue mass along the course of the right gonadal/testicular vein, inseparable from the anterior surface of the right psoas muscle, highly concerning for tumor thrombus of right gonadal vein/testicular vein versus metastatic lymphadenopathy. 2. There is resultant moderate right hydronephrosis and upper hydroureter. 3. Otherwise no metastatic disease identified within the abdomen or pelvis. 4. Multiple other nonacute observations, as described above. Electronically Signed   By: Jules Schick M.D.   On: 07/12/2023 11:00    Pending Labs Unresulted Labs (From admission, onward)    None       Vitals/Pain Today's Vitals   07/12/23 1327 07/12/23 1330 07/12/23 1340 07/12/23 1342  BP: 133/87 133/87 (!) 143/85   Pulse: 70 (!) 56 68   Resp: 18     Temp: 98.3 F (36.8 C)   98.3 F (36.8 C)  TempSrc: Oral   Oral  SpO2: 100% 100% 100%   Weight:      Height:      PainSc:   0-No pain 0-No pain    Isolation Precautions No active isolations  Medications Medications  iohexol (OMNIPAQUE) 300 MG/ML solution 100 mL (100 mLs Intravenous Contrast Given 07/12/23 1029)    Mobility walks     Focused Assessments    R Recommendations: See Admitting Provider Note  Report given to: Marylene Land, RN  Additional Notes:

## 2023-07-12 NOTE — ED Triage Notes (Signed)
Blood in stool x1 this morning, lower abd pain, nasuea. Denies diarrhea or emesis.  Chemo patient

## 2023-07-12 NOTE — Progress Notes (Signed)
Plan of Care Note for accepted transfer   Patient: Daniel Shepherd MRN: [id number]   DOA: 07/12/2023  Facility requesting transfer: [facility].  Requesting Provider: Othella Boyer, MD. Reason for transfer: Right gonadal mass Facility course:  History of bilateral testicular cancer following with Dr. Myna Hidalgo who presented with right lower abdominal/groin pain in the setting open the right groin mass with possible thrombus formation.  The patient was started on heparin after discussion with oncology on-call (Dr. Pamelia Hoit).  He has also developed moderate right-sided hydroureteronephrosis.  Case discussed with urology and no procedure needed at the moment.  Lab work: Urinalysis, Routine w reflex microscopic -Urine, Clean Catch [[id number]] (Abnormal)   Collected: 07/12/23 1119   Updated: 07/12/23 1148   Specimen Source: Urine, Clean Catch    Color, Urine STRAW Abnormal    APPearance CLEAR   Specific Gravity, Urine 1.015   pH 8.0   Glucose, UA NEGATIVE mg/dL   Hgb urine dipstick NEGATIVE   Bilirubin Urine NEGATIVE   Ketones, ur NEGATIVE mg/dL   Protein, ur NEGATIVE mg/dL   Nitrite NEGATIVE   Leukocytes,Ua NEGATIVE  Comprehensive metabolic panel [[id number]] (Abnormal)   Collected: 07/12/23 0846   Updated: 07/12/23 0908   Specimen Type: Blood   Specimen Source: Vein    Sodium 134 Low  mmol/L   Potassium 4.0 mmol/L   Chloride 101 mmol/L   CO2 26 mmol/L   Glucose, Bld 101 High  mg/dL   BUN 14 mg/dL   Creatinine, Ser 6.29 mg/dL   Calcium 8.9 mg/dL   Total Protein 6.8 g/dL   Albumin 3.7 g/dL   AST 18 U/L   ALT 20 U/L   Alkaline Phosphatase 77 U/L   Total Bilirubin 0.5 mg/dL   GFR, Estimated >52 mL/min   Anion gap 7  Lipase, blood [[id number]]   Collected: 07/12/23 0846   Updated: 07/12/23 0908   Specimen Type: Blood   Specimen Source: Vein    Lipase 29 U/L  CBC [[id number]] (Abnormal)   Collected: 07/12/23 0846   Updated: 07/12/23 0849   Specimen Type: Blood    Specimen Source: Vein    WBC 2.0 Low  K/uL   RBC 3.64 Low  MIL/uL   Hemoglobin 10.9 Low  g/dL   HCT 40.3 Low  %   MCV 89.8 fL   MCH 29.9 pg   MCHC 33.3 g/dL   RDW 47.4 %   Platelets 175 K/uL   nRBC 0.0 %    Imaging: FINDINGS: Lower chest: The lung bases are clear. No pleural effusion. The heart is normal in size. No pericardial effusion.   Hepatobiliary: The liver is normal in size. Non-cirrhotic configuration. No suspicious mass. There is a 5 mm hypoattenuating focus in the left hepatic dome (series 301, image 12), which is too small to adequately characterize but appears unchanged since the prior study. No intrahepatic or extrahepatic bile duct dilation. No calcified gallstones. Normal gallbladder wall thickness. No pericholecystic inflammatory changes.   Pancreas: Unremarkable. No pancreatic ductal dilatation or surrounding inflammatory changes.   Spleen: Within normal limits. No focal lesion.   Adrenals/Urinary Tract: Adrenal glands are unremarkable. No suspicious renal mass within the limitations of this unenhanced exam. There is new moderate right hydronephrosis secondary to an elongated soft tissue mass compressing the right upper/mid ureter, described in detail below. Right lower ureter is unremarkable. No left hydroureteronephrosis. No nephroureterolithiasis on either side. Unremarkable urinary bladder.   Stomach/Bowel: No disproportionate dilation of the small or large bowel loops.  No evidence of abnormal bowel wall thickening or inflammatory changes. The appendix was not visualized; however there is no acute inflammatory process in the right lower quadrant.   Vascular/Lymphatic: No ascites or pneumoperitoneum. There is elongated soft tissue extending from the level of deep inguinal canal reaching up to the inferior vena cava along the course of the right gonadal vein, inseparable from the anterior surface of the right psoas muscle, highly concerning for  tumor thrombus of gonadal vein/metastatic lymphadenopathy in this patient with known history of testicular carcinoma. No aneurysmal dilation of the major abdominal arteries. 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   Reproductive: There are postsurgical changes in the right inguinal region from prior orchiectomy. Partially seen testicular prosthesis. Right spermatic cord is also not seen and likely surgically absent. Correlate with surgical history.   Other: There is a tiny fat containing umbilical hernia. The soft tissues and abdominal wall are otherwise unremarkable.   Musculoskeletal: No suspicious osseous lesions.   IMPRESSION: 1. There is elongated soft tissue mass along the course of the right gonadal/testicular vein, inseparable from the anterior surface of the right psoas muscle, highly concerning for tumor thrombus of right gonadal vein/testicular vein versus metastatic lymphadenopathy. 2. There is resultant moderate right hydronephrosis and upper hydroureter. 3. Otherwise no metastatic disease identified within the abdomen or pelvis. 4. Multiple other nonacute observations, as described above.     Electronically Signed   By: Jules Schick M.D.   On: 07/12/2023 11:00  Plan of care: The patient is accepted for admission to Med-surg  unit, at [facility]  The patient will be started on heparin infusion per oncology recommendations.  Author: Bobette Mo, MD 07/12/2023  Check [email or web address] for on-call coverage.  Nursing staff, Please call [facility] Admits & Consults System-Wide number on Amion as soon as patient's arrival, so appropriate admitting provider can evaluate the pt.

## 2023-07-12 NOTE — ED Provider Notes (Signed)
[place] [facility] Provider Note   CSN: [id number] Arrival date & time: 07/12/23  9147     History  Chief Complaint  Patient presents with   Rectal Bleeding    Daniel Shepherd is a 28 y.o. male.  28 yo M metastatic testicular cancer and a FHx of lynch syndrome presents to ER complaining of hematochezia this morning. Pt reports this morning when he went to the bathroom he saw BRBPR. This was his only episode and he has not gone to the bathroom since. The blood filled the toilet bowl and he has never had anything like this before. He denies any lightheadedness, dizziness, hematuria or dysuria. In addition to this he also has lower crampy middle and LLQ abdominal pain. He has had some nausea but denies any vomiting. He denies any fevers, chills, chest pain, or SOB. His last dose of chemo was 1/22 and he reports being started on an antibiotic and another medication because his WBC was low. Outside of his chemo medications he takes OTC multivitamins and BP meds.  Patient is currently followed by Dr. Myna Hidalgo.  He is scheduled for treatment tomorrow.  The history is provided by the patient and the spouse.  Rectal Bleeding      Home Medications Prior to Admission medications   Medication Sig Start Date End Date Taking? Authorizing Provider  Creatine Monohydrate POWD Take by mouth daily.    [provider]  dexamethasone (DECADRON) 4 MG tablet Take 2 tablets (8 mg total) by mouth daily. Start the day after last cisplatin dose on day 6 of chemo x 3 days.Take with food. 06/22/23   Josph Macho, MD  EPINEPHrine 0.3 mg/0.3 mL IJ SOAJ injection Inject into the skin. Patient not taking: Reported on 11/05/2020 02/04/20   [provider]  HYDROcodone-acetaminophen (NORCO) 5-325 MG tablet Take 1 tablet by mouth every 6 (six) hours as needed for moderate pain (pain score 4-6). 06/23/23   Josph Macho, MD  levofloxacin (LEVAQUIN) 500 MG tablet Take  1 tablet (500 mg total) by mouth daily. 07/08/23   Josph Macho, MD  lidocaine-prilocaine (EMLA) cream Apply to affected area once 06/22/23   Josph Macho, MD  Multiple Vitamin (MULTIVITAMIN) tablet Take 1 tablet by mouth daily.    [provider]  ondansetron (ZOFRAN) 8 MG tablet Take 1 tablet (8 mg total) by mouth every 8 (eight) hours as needed for nausea or vomiting. Start on the third day after last cisplatin dose on day 8 of chemo 06/22/23   Josph Macho, MD  ondansetron (ZOFRAN-ODT) 4 MG disintegrating tablet Take by mouth every 8 (eight) hours as needed. Patient not taking: Reported on 05/07/2023 05/06/23   [provider]  prochlorperazine (COMPAZINE) 10 MG tablet Take 1 tablet (10 mg total) by mouth every 6 (six) hours as needed for nausea or vomiting. 06/22/23   Josph Macho, MD  testosterone cypionate (DEPOTESTOSTERONE CYPIONATE) 200 MG/ML injection Inject into the muscle every 7 (seven) days. 12/18/22   [provider]      Allergies    Bee venom and Other    Review of Systems   Review of Systems  Gastrointestinal:  Positive for blood in stool and hematochezia.  All other systems reviewed and are negative.   Physical Exam Updated Vital Signs BP (!) 145/80   Pulse 81   Temp (!) 97.5 F (36.4 C) (Oral)   Resp 18   Ht 6\' 1"  (1.854  m)   Wt 90.7 kg   SpO2 100%   BMI 26.39 kg/m  Physical Exam Vitals and nursing note reviewed.  Constitutional:      General: He is not in acute distress.    Appearance: He is well-developed.  HENT:     Head: Normocephalic and atraumatic.  Eyes:     Conjunctiva/sclera: Conjunctivae normal.  Cardiovascular:     Rate and Rhythm: Normal rate and regular rhythm.     Heart sounds: No murmur heard. Pulmonary:     Effort: Pulmonary effort is normal. No respiratory distress.     Breath sounds: Normal breath sounds.  Abdominal:     Palpations: Abdomen is soft.     Tenderness: There is no abdominal tenderness.   Musculoskeletal:        General: No swelling.     Cervical back: Neck supple.  Skin:    General: Skin is warm and dry.     Capillary Refill: Capillary refill takes less than 2 seconds.  Neurological:     Mental Status: He is alert.  Psychiatric:        Mood and Affect: Mood normal.     ED Results / Procedures / Treatments   Labs (all labs ordered are listed, but only abnormal results are displayed) Labs Reviewed  COMPREHENSIVE METABOLIC PANEL - Abnormal; Notable for the following components:      Result Value   Sodium 134 (*)    Glucose, Bld 101 (*)    All other components within normal limits  CBC - Abnormal; Notable for the following components:   WBC 2.0 (*)    RBC 3.64 (*)    Hemoglobin 10.9 (*)    HCT 32.7 (*)    All other components within normal limits  URINALYSIS, ROUTINE W REFLEX MICROSCOPIC - Abnormal; Notable for the following components:   Color, Urine STRAW (*)    All other components within normal limits  LIPASE, BLOOD    EKG None  Radiology CT ABDOMEN PELVIS W CONTRAST Result Date: 07/12/2023 CLINICAL DATA:  Abdominal pain, acute, nonlocalized. History of testicular carcinoma. * Tracking Code: BO * EXAM: CT ABDOMEN AND PELVIS WITH CONTRAST TECHNIQUE: Multidetector CT imaging of the abdomen and pelvis was performed using the standard protocol following bolus administration of intravenous contrast. RADIATION DOSE REDUCTION: This exam was performed according to the departmental dose-optimization program which includes automated exposure control, adjustment of the mA and/or kV according to patient size and/or use of iterative reconstruction technique. CONTRAST:  OMNIPAQUE IOHEXOL 300 MG/ML  SOLN COMPARISON:  CT scan abdomen and pelvis from 10/23/2022. FINDINGS: Lower chest: The lung bases are clear. No pleural effusion. The heart is normal in size. No pericardial effusion. Hepatobiliary: The liver is normal in size. Non-cirrhotic configuration. No suspicious  mass. There is a 5 mm hypoattenuating focus in the left hepatic dome (series 301, image 12), which is too small to adequately characterize but appears unchanged since the prior study. No intrahepatic or extrahepatic bile duct dilation. No calcified gallstones. Normal gallbladder wall thickness. No pericholecystic inflammatory changes. Pancreas: Unremarkable. No pancreatic ductal dilatation or surrounding inflammatory changes. Spleen: Within normal limits. No focal lesion. Adrenals/Urinary Tract: Adrenal glands are unremarkable. No suspicious renal mass within the limitations of this unenhanced exam. There is new moderate right hydronephrosis secondary to an elongated soft tissue mass compressing the right upper/mid ureter, described in detail below. Right lower ureter is unremarkable. No left hydroureteronephrosis. No nephroureterolithiasis on either side. Unremarkable urinary bladder. Stomach/Bowel:  No disproportionate dilation of the small or large bowel loops. No evidence of abnormal bowel wall thickening or inflammatory changes. The appendix was not visualized; however there is no acute inflammatory process in the right lower quadrant. Vascular/Lymphatic: No ascites or pneumoperitoneum. There is elongated soft tissue extending from the level of deep inguinal canal reaching up to the inferior vena cava along the course of the right gonadal vein, inseparable from the anterior surface of the right psoas muscle, highly concerning for tumor thrombus of gonadal vein/metastatic lymphadenopathy in this patient with known history of testicular carcinoma. No aneurysmal dilation of the major abdominal arteries. 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 Reproductive: There are postsurgical changes in the right inguinal region from prior orchiectomy. Partially seen testicular prosthesis. Right spermatic cord is also not seen and likely surgically absent. Correlate with surgical history. Other: There is a tiny fat  containing umbilical hernia. The soft tissues and abdominal wall are otherwise unremarkable. Musculoskeletal: No suspicious osseous lesions. IMPRESSION: 1. There is elongated soft tissue mass along the course of the right gonadal/testicular vein, inseparable from the anterior surface of the right psoas muscle, highly concerning for tumor thrombus of right gonadal vein/testicular vein versus metastatic lymphadenopathy. 2. There is resultant moderate right hydronephrosis and upper hydroureter. 3. Otherwise no metastatic disease identified within the abdomen or pelvis. 4. Multiple other nonacute observations, as described above. Electronically Signed   By: Jules Schick M.D.   On: 07/12/2023 11:00    Procedures Procedures    Medications Ordered in ED Medications  iohexol (OMNIPAQUE) 300 MG/ML solution 100 mL (100 mLs Intravenous Contrast Given 07/12/23 1029)    ED Course/ Medical Decision Making/ A&P                                 Medical Decision Making Patient reports he noticed blood when wiping.  He also noticed bright red blood in the toilet.  Amount and/or Complexity of Data Reviewed Independent Historian: spouse    Details: Patient is here with his spouse who is supportive External Data Reviewed: notes.    Details: Oncology notes reviewed, [facility] notes reviewed Labs: ordered. Decision-making details documented in ED Course.    Details: Lab was ordered reviewed and interpreted patient's hemoglobin is 10.9.  White blood cell count is 2.0 Radiology: ordered and independent interpretation performed. Decision-making details documented in ED Course.    Details: CT ABDOMEN PELVIS W CONTRAST Result Date: 07/12/2023 CLINICAL DATA:  Abdominal pain, acute, nonlocalized. History of testicular carcinoma. * Tracking Code: BO * EXAM: CT ABDOMEN AND PELVIS WITH CONTRAST TECHNIQUE: Multidetector CT imaging of the abdomen and pelvis was performed using the standard protocol following bolus  administration of intravenous contrast. RADIATION DOSE REDUCTION: This exam was performed according to the departmental dose-optimization program which includes automated exposure control, adjustment of the mA and/or kV according to patient size and/or use of iterative reconstruction technique. CONTRAST:  OMNIPAQUE IOHEXOL 300 MG/ML  SOLN COMPARISON:  CT scan abdomen and pelvis from 10/23/2022. FINDINGS: Lower chest: The lung bases are clear. No pleural effusion. The heart is normal in size. No pericardial effusion. Hepatobiliary: The liver is normal in size. Non-cirrhotic configuration. No suspicious mass. There is a 5 mm hypoattenuating focus in the left hepatic dome (series 301, image 12), which is too small to adequately characterize but appears unchanged since the prior study. No intrahepatic or extrahepatic bile duct dilation. No  calcified gallstones. Normal gallbladder wall thickness. No pericholecystic inflammatory changes. Pancreas: Unremarkable. No pancreatic ductal dilatation or surrounding inflammatory changes. Spleen: Within normal limits. No focal lesion. Adrenals/Urinary Tract: Adrenal glands are unremarkable. No suspicious renal mass within the limitations of this unenhanced exam. There is new moderate right hydronephrosis secondary to an elongated soft tissue mass compressing the right upper/mid ureter, described in detail below. Right lower ureter is unremarkable. No left hydroureteronephrosis. No nephroureterolithiasis on either side. Unremarkable urinary bladder. Stomach/Bowel: No disproportionate dilation of the small or large bowel loops. No evidence of abnormal bowel wall thickening or inflammatory changes. The appendix was not visualized; however there is no acute inflammatory process in the right lower quadrant. Vascular/Lymphatic: No ascites or pneumoperitoneum. There is elongated soft tissue extending from the level of deep inguinal canal reaching up to the inferior vena cava along  the course of the right gonadal vein, inseparable from the anterior surface of the right psoas muscle, highly concerning for tumor thrombus of gonadal vein/metastatic lymphadenopathy in this patient with known history of testicular carcinoma. No aneurysmal dilation of the major abdominal arteries. 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 Reproductive: There are postsurgical changes in the right inguinal region from prior orchiectomy. Partially seen testicular prosthesis. Right spermatic cord is also not seen and likely surgically absent. Correlate with surgical history. Other: There is a tiny fat containing umbilical hernia. The soft tissues and abdominal wall are otherwise unremarkable. Musculoskeletal: No suspicious osseous lesions. IMPRESSION: 1. There is elongated soft tissue mass along the course of the right gonadal/testicular vein, inseparable from the anterior surface of the right psoas muscle, highly concerning for tumor thrombus of right gonadal vein/testicular vein versus metastatic lymphadenopathy. 2. There is resultant moderate right hydronephrosis and upper hydroureter. 3. Otherwise no metastatic disease identified within the abdomen or pelvis. 4. Multiple other nonacute observations, as described above. Electronically Signed   By: Jules Schick M.D.   On: 07/12/2023 11:00   Discussion of management or test interpretation with external provider(s): I spoke with Dr. Pamelia Hoit oncologist on call he advised admit for anticoagulation.  He also requested that I consult urology to make sure that patient did not need any type of procedure for hydronephrosis.  I spoke to Dr. Ronne Binning urologist on call.  He does not feel patient needs any intervention at this time.  Hospitalist consulted for admission to [facility].  Risk Prescription drug management.           Final Clinical Impression(s) / ED Diagnoses Final diagnoses:  Thrombus  Hydronephrosis, unspecified hydronephrosis type     Rx / DC Orders ED Discharge Orders     None         Osie Cheeks 07/12/23 1242    Terald Sleeper, MD 07/12/23 1531

## 2023-07-12 NOTE — Progress Notes (Addendum)
PHARMACY - ANTICOAGULATION CONSULT NOTE  Pharmacy Consult for heparin  Indication: Groin area tumoral thrombosis  Allergies  Allergen Reactions   Bee Venom Anaphylaxis   Other Swelling    Bee allergy    Patient Measurements: Height: 6\' 1"  (185.4 cm) Weight: 90.7 kg (200 lb) IBW/kg (Calculated) : 79.9 Heparin Dosing Weight: 90.7 kg  Vital Signs: Temp: 98.3 F (36.8 C) (01/26 1342) Temp Source: Oral (01/26 1342) BP: 143/85 (01/26 1340) Pulse Rate: 68 (01/26 1340)  Labs: Recent Labs    07/12/23 0846  HGB 10.9*  HCT 32.7*  PLT 175  CREATININE 0.97    Estimated Creatinine Clearance: 127 mL/min (by C-G formula based on SCr of 0.97 mg/dL).   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 Pharmacy consulted to dose heparin for R groin area tumoral thrombosis per Onc recs.   Pt CC: hematochezia this morning. Pt reports this morning when he went to the bathroom he saw BRBPR.  Hg 10.9 ( was 10.4 on 07/08/23, PLT 175 (was 124 07/07/22) , SCr 0.97  Goal of Therapy:  Heparin level 0.3-0.7 units/ml Monitor platelets by anticoagulation protocol: Yes   Plan:  Using [place] Calculator:  Heparin 2000 units IV x 1  Heparin drip @ 1350 units/hr Check heparin level 6 hours after bolus Daily CBC & heparin level F/u hematochezia  Herby Abraham, Pharm.D Use secure chat for questions 07/12/2023 2:46 PM

## 2023-07-12 NOTE — ED Notes (Signed)
Called [facility] @13 :24 to advise patient has bed ready, but will go POV.

## 2023-07-12 NOTE — ED Notes (Signed)
Pt ambulated to bathroom for UA. No current pain or distress.

## 2023-07-12 NOTE — H&P (Addendum)
History and Physical    Patient: Daniel Shepherd WUJ:[id number] DOB: 09-May-1996 DOA: 07/12/2023 DOS: the patient was seen and examined on 07/12/2023 PCP: Default, Provider, MD  Patient coming from: Home  Chief Complaint:  Chief Complaint  Patient presents with   Rectal Bleeding   HPI: Daniel Shepherd is a 28 y.o. 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 Myna Hidalgo who presented to emergency department complaints of rectal bleeding and lower abdominal pain.  He was recently constipated, they had some loose stools after treating the constipation with bowel movements normalizing in the past couple days.  This morning he had a regular bowel movement without straining and noticed blood in the toilet bowl.  No nausea or emesis.   No flank pain, dysuria, frequency or hematuria.He denied fever, chills, rhinorrhea, sore throat, wheezing or hemoptysis. No chest pain, palpitations, diaphoresis, PND, orthopnea or pitting edema of the lower extremities.  No polyuria, polydipsia, polyphagia or blurred vision.   Lab work: Unremarkable urinalysis.  CBC with a white count of 2.0, hemoglobin 10.9 g/dL platelets 956.  Lipase was normal.  CMP showed a sodium 134 mmol/L and a glucose of 101 mg/dL, but was otherwise unremarkable.  Imaging: CT abdomen/pelvis with contrast showing an elongated soft tissue mass along the course of the right colon along with left testicular pain, inseparable from the anterior surface of the right psoas, highly concerning for tumor thrombus of right gonadal vein/testicular vein versus metastatic lymphadenopathy.  There is resultant moderate right hydronephrosis and upper hydroureter.  Otherwise no metastatic disease identified within the abdomen or pelvis.   ED course: Initial vital signs were temperature 97.5 F, pulse 81, respiration 18, BP 145/80 mmHg O2 sat 100% on room  air.  Review of Systems: As mentioned in the history of present illness. All other systems reviewed and are negative. 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   Testicular cancer [facility])    Past Surgical History:  Procedure Laterality Date   HYDROCELE EXCISION Left 07/03/2017   Procedure: HYDROCELECTOMY ADULT/ INGUINAL APPROACH/ TESTICULAR BIOPSY/ ORCHIECTOMY;  Surgeon: Ihor Gully, MD;  Location: [facility] [place];  Service: Urology;  Laterality: Left;   IR FLUORO GUIDE PORT INSERTION RIGHT  07/09/2017   IR IMAGING GUIDED PORT INSERTION  06/19/2023   IR REMOVAL TUN ACCESS W/ PORT W/O FL MOD SED  10/12/2017   IR US GUIDE VASC ACCESS RIGHT  07/09/2017   NO PAST SURGERIES     ORCHIECTOMY     Social History:  reports that he has never smoked. He has never used smokeless tobacco. He reports current alcohol use. He reports current drug use. Drug: Marijuana.  Allergies  Allergen Reactions   Bee Venom Anaphylaxis   Other Swelling    Bee allergy    Family History  Problem Relation Age of Onset   Healthy Mother    Healthy Father    Colon cancer Other        Lynch syndrome   Breast  cancer Maternal Great-grandmother     Prior to Admission medications   Medication Sig Start Date End Date Taking? Authorizing Provider  Creatine Monohydrate POWD Take by mouth daily.    [provider]  dexamethasone (DECADRON) 4 MG tablet Take 2 tablets (8 mg total) by mouth daily. Start the day after last cisplatin dose on day 6 of chemo x 3 days.Take with food. 06/22/23   Josph Macho, MD  EPINEPHrine  0.3 mg/0.3 mL IJ SOAJ injection Inject into the skin. Patient not taking: Reported on 11/05/2020 02/04/20   [provider]  HYDROcodone-acetaminophen (NORCO) 5-325 MG tablet Take 1 tablet by mouth every 6 (six) hours as needed for moderate pain (pain score 4-6). 06/23/23   Josph Macho, MD  levofloxacin (LEVAQUIN) 500 MG tablet Take 1 tablet (500 mg total) by mouth daily. 07/08/23   Josph Macho, MD  lidocaine-prilocaine (EMLA) cream Apply to affected area once 06/22/23   Josph Macho, MD  Multiple Vitamin (MULTIVITAMIN) tablet Take 1 tablet by mouth daily.    [provider]  ondansetron (ZOFRAN) 8 MG tablet Take 1 tablet (8 mg total) by mouth every 8 (eight) hours as needed for nausea or vomiting. Start on the third day after last cisplatin dose on day 8 of chemo 06/22/23   Josph Macho, MD  ondansetron (ZOFRAN-ODT) 4 MG disintegrating tablet Take by mouth every 8 (eight) hours as needed. Patient not taking: Reported on 05/07/2023 05/06/23   [provider]  prochlorperazine (COMPAZINE) 10 MG tablet Take 1 tablet (10 mg total) by mouth every 6 (six) hours as needed for nausea or vomiting. 06/22/23   Josph Macho, MD  testosterone cypionate (DEPOTESTOSTERONE CYPIONATE) 200 MG/ML injection Inject into the muscle every 7 (seven) days. 12/18/22   [provider]    Physical Exam: Vitals:   07/12/23 1327 07/12/23 1330 07/12/23 1340 07/12/23 1342  BP: 133/87 133/87 (!) 143/85   Pulse: 70 (!) 56 68   Resp: 18     Temp: 98.3 F (36.8 C)   98.3 F (36.8 C)  TempSrc: Oral   Oral  SpO2: 100% 100% 100%   Weight:      Height:       Physical Exam Vitals and nursing note reviewed.  Constitutional:      General: He is awake. He is not in acute distress.    Appearance: Normal appearance. He is ill-appearing.  HENT:     Head: Normocephalic.     Nose: No rhinorrhea.     Mouth/Throat:     Mouth: Mucous membranes are moist.  Eyes:     General: No scleral  icterus.    Pupils: Pupils are equal, round, and reactive to light.  Neck:     Vascular: No JVD.  Cardiovascular:     Rate and Rhythm: Normal rate and regular rhythm.     Heart sounds: S1 normal and S2 normal.  Pulmonary:     Effort: Pulmonary effort is normal.     Breath sounds: Normal breath sounds.  Abdominal:     General: Bowel sounds are normal.     Palpations: Abdomen is soft.  Musculoskeletal:     Cervical back: Neck supple.     Right lower leg: No edema.     Left lower leg: No edema.  Skin:    General: Skin is warm and dry.  Neurological:     General: No focal deficit present.     Mental Status: He  is alert and oriented to person, place, and time.  Psychiatric:        Mood and Affect: Mood normal.        Behavior: Behavior normal. Behavior is cooperative.     Data Reviewed:  Results are pending, will review when available.  Assessment and Plan: Principal Problem:   Right groin mass Associated with:   Thrombosis  Patient with history of:   Seminoma of right testis (HCC) Observation/MedSurg. Begin heparin infusion. Hold infusion if rectal bleeding recurs. Analgesics and antiemetics as needed. Will discuss further with primary oncologist tomorrow.  Active Problems:   Constipation Treated recently followed by loose stools    Rectal bleed Superimposed on:   Normocytic anemia No previous history. Will monitor H&H. Consider GI evaluation.    Neutropenia (HCC) Monitor white blood cell count. Sepsis workup if febrile.    Hyponatremia Minimal. Likely from recent GI losses. Follow-up sodium level.    Advance Care Planning:   Code Status: Full Code   Consults:   Family Communication:   Severity of Illness: The appropriate patient status for this patient is OBSERVATION. Observation status is judged to be reasonable and necessary in order to provide the required intensity of service to ensure the patient's safety. The patient's presenting symptoms,  physical exam findings, and initial radiographic and laboratory data in the context of their medical condition is felt to place them at decreased risk for further clinical deterioration. Furthermore, it is anticipated that the patient will be medically stable for discharge from the hospital within 2 midnights of admission.   Author: Bobette Mo, MD 07/12/2023 2:03 PM  For on call review [email or web address].   This document was prepared using Dragon voice recognition software and may contain some unintended transcription errors.

## 2023-07-13 ENCOUNTER — Inpatient Hospital Stay: Payer: Managed Care, Other (non HMO)

## 2023-07-13 ENCOUNTER — Encounter: Payer: Self-pay | Admitting: *Deleted

## 2023-07-13 ENCOUNTER — Observation Stay (HOSPITAL_COMMUNITY): Payer: Managed Care, Other (non HMO)

## 2023-07-13 ENCOUNTER — Inpatient Hospital Stay: Payer: Managed Care, Other (non HMO) | Admitting: Hematology & Oncology

## 2023-07-13 DIAGNOSIS — K625 Hemorrhage of anus and rectum: Secondary | ICD-10-CM

## 2023-07-13 DIAGNOSIS — C6291 Malignant neoplasm of right testis, unspecified whether descended or undescended: Secondary | ICD-10-CM | POA: Diagnosis present

## 2023-07-13 DIAGNOSIS — Z91030 Bee allergy status: Secondary | ICD-10-CM | POA: Diagnosis not present

## 2023-07-13 DIAGNOSIS — D649 Anemia, unspecified: Secondary | ICD-10-CM

## 2023-07-13 DIAGNOSIS — N1330 Unspecified hydronephrosis: Secondary | ICD-10-CM | POA: Diagnosis present

## 2023-07-13 DIAGNOSIS — Z7901 Long term (current) use of anticoagulants: Secondary | ICD-10-CM | POA: Diagnosis not present

## 2023-07-13 DIAGNOSIS — K5900 Constipation, unspecified: Secondary | ICD-10-CM | POA: Diagnosis present

## 2023-07-13 DIAGNOSIS — D709 Neutropenia, unspecified: Secondary | ICD-10-CM

## 2023-07-13 DIAGNOSIS — Z8489 Family history of other specified conditions: Secondary | ICD-10-CM | POA: Diagnosis not present

## 2023-07-13 DIAGNOSIS — R944 Abnormal results of kidney function studies: Secondary | ICD-10-CM

## 2023-07-13 DIAGNOSIS — Z803 Family history of malignant neoplasm of breast: Secondary | ICD-10-CM | POA: Diagnosis not present

## 2023-07-13 DIAGNOSIS — R1909 Other intra-abdominal and pelvic swelling, mass and lump: Secondary | ICD-10-CM | POA: Diagnosis not present

## 2023-07-13 DIAGNOSIS — E871 Hypo-osmolality and hyponatremia: Secondary | ICD-10-CM

## 2023-07-13 DIAGNOSIS — C7989 Secondary malignant neoplasm of other specified sites: Secondary | ICD-10-CM

## 2023-07-13 DIAGNOSIS — Z800 Family history of malignant neoplasm of digestive organs: Secondary | ICD-10-CM | POA: Diagnosis not present

## 2023-07-13 DIAGNOSIS — I82890 Acute embolism and thrombosis of other specified veins: Principal | ICD-10-CM

## 2023-07-13 DIAGNOSIS — K921 Melena: Secondary | ICD-10-CM | POA: Diagnosis present

## 2023-07-13 DIAGNOSIS — Z79899 Other long term (current) drug therapy: Secondary | ICD-10-CM | POA: Diagnosis not present

## 2023-07-13 DIAGNOSIS — I10 Essential (primary) hypertension: Secondary | ICD-10-CM | POA: Diagnosis present

## 2023-07-13 DIAGNOSIS — N179 Acute kidney failure, unspecified: Secondary | ICD-10-CM | POA: Diagnosis present

## 2023-07-13 DIAGNOSIS — I8290 Acute embolism and thrombosis of unspecified vein: Secondary | ICD-10-CM | POA: Diagnosis present

## 2023-07-13 DIAGNOSIS — Z9221 Personal history of antineoplastic chemotherapy: Secondary | ICD-10-CM | POA: Diagnosis not present

## 2023-07-13 DIAGNOSIS — Z9079 Acquired absence of other genital organ(s): Secondary | ICD-10-CM | POA: Diagnosis not present

## 2023-07-13 DIAGNOSIS — D500 Iron deficiency anemia secondary to blood loss (chronic): Secondary | ICD-10-CM | POA: Diagnosis present

## 2023-07-13 DIAGNOSIS — Z85118 Personal history of other malignant neoplasm of bronchus and lung: Secondary | ICD-10-CM | POA: Diagnosis not present

## 2023-07-13 DIAGNOSIS — C779 Secondary and unspecified malignant neoplasm of lymph node, unspecified: Secondary | ICD-10-CM | POA: Diagnosis present

## 2023-07-13 LAB — COMPREHENSIVE METABOLIC PANEL
ALT: 18 U/L (ref 0–44)
ALT: 19 U/L (ref 0–44)
AST: 16 U/L (ref 15–41)
AST: 17 U/L (ref 15–41)
Albumin: 3.6 g/dL (ref 3.5–5.0)
Albumin: 3.6 g/dL (ref 3.5–5.0)
Alkaline Phosphatase: 73 U/L (ref 38–126)
Alkaline Phosphatase: 75 U/L (ref 38–126)
Anion gap: 9 (ref 5–15)
Anion gap: 9 (ref 5–15)
BUN: 12 mg/dL (ref 6–20)
BUN: 15 mg/dL (ref 6–20)
CO2: 26 mmol/L (ref 22–32)
CO2: 26 mmol/L (ref 22–32)
Calcium: 8.8 mg/dL — ABNORMAL LOW (ref 8.9–10.3)
Calcium: 9.1 mg/dL (ref 8.9–10.3)
Chloride: 102 mmol/L (ref 98–111)
Chloride: 103 mmol/L (ref 98–111)
Creatinine, Ser: 1.04 mg/dL (ref 0.61–1.24)
Creatinine, Ser: 1.09 mg/dL (ref 0.61–1.24)
GFR, Estimated: >60 mL/min (ref >60)
GFR, Estimated: >60 mL/min (ref >60)
Glucose, Bld: 102 mg/dL — ABNORMAL HIGH (ref 70–99)
Glucose, Bld: 104 mg/dL — ABNORMAL HIGH (ref 70–99)
Potassium: 3.8 mmol/L (ref 3.5–5.1)
Potassium: 4 mmol/L (ref 3.5–5.1)
Sodium: 137 mmol/L (ref 135–145)
Sodium: 138 mmol/L (ref 135–145)
Total Bilirubin: 0.4 mg/dL (ref 0.0–1.2)
Total Bilirubin: 0.6 mg/dL (ref 0.0–1.2)
Total Protein: 6.8 g/dL (ref 6.5–8.1)
Total Protein: 7 g/dL (ref 6.5–8.1)

## 2023-07-13 LAB — DIFFERENTIAL
Abs Immature Granulocytes: 0.25 10*3/uL — ABNORMAL HIGH (ref 0.00–0.07)
Basophils Absolute: 0 10*3/uL (ref 0.0–0.1)
Basophils Relative: 1 %
Eosinophils Absolute: 0.1 10*3/uL (ref 0.0–0.5)
Eosinophils Relative: 3 %
Immature Granulocytes: 8 %
Lymphocytes Relative: 35 %
Lymphs Abs: 1.1 10*3/uL (ref 0.7–4.0)
Monocytes Absolute: 0.6 10*3/uL (ref 0.1–1.0)
Monocytes Relative: 19 %
Neutro Abs: 1.1 10*3/uL — ABNORMAL LOW (ref 1.7–7.7)
Neutrophils Relative %: 34 %

## 2023-07-13 LAB — CBC
HCT: 32.8 % — ABNORMAL LOW (ref 39.0–52.0)
HCT: 34.2 % — ABNORMAL LOW (ref 39.0–52.0)
HCT: 34.7 % — ABNORMAL LOW (ref 39.0–52.0)
Hemoglobin: 10.7 g/dL — ABNORMAL LOW (ref 13.0–17.0)
Hemoglobin: 11.2 g/dL — ABNORMAL LOW (ref 13.0–17.0)
Hemoglobin: 11.2 g/dL — ABNORMAL LOW (ref 13.0–17.0)
MCH: 29.7 pg (ref 26.0–34.0)
MCH: 30 pg (ref 26.0–34.0)
MCH: 30.1 pg (ref 26.0–34.0)
MCHC: 32.3 g/dL (ref 30.0–36.0)
MCHC: 32.6 g/dL (ref 30.0–36.0)
MCHC: 32.7 g/dL (ref 30.0–36.0)
MCV: 91.7 fL (ref 80.0–100.0)
MCV: 92 fL (ref 80.0–100.0)
MCV: 92.1 fL (ref 80.0–100.0)
Platelets: 187 10*3/uL (ref 150–400)
Platelets: 197 10*3/uL (ref 150–400)
Platelets: 216 10*3/uL (ref 150–400)
RBC: 3.56 MIL/uL — ABNORMAL LOW (ref 4.22–5.81)
RBC: 3.73 MIL/uL — ABNORMAL LOW (ref 4.22–5.81)
RBC: 3.77 MIL/uL — ABNORMAL LOW (ref 4.22–5.81)
RDW: 14 % (ref 11.5–15.5)
RDW: 14.2 % (ref 11.5–15.5)
RDW: 14.4 % (ref 11.5–15.5)
WBC: 2.8 10*3/uL — ABNORMAL LOW (ref 4.0–10.5)
WBC: 2.8 10*3/uL — ABNORMAL LOW (ref 4.0–10.5)
WBC: 3.2 10*3/uL — ABNORMAL LOW (ref 4.0–10.5)
nRBC: 0 % (ref 0.0–0.2)
nRBC: 0 % (ref 0.0–0.2)
nRBC: 0 % (ref 0.0–0.2)

## 2023-07-13 LAB — HIV ANTIBODY (ROUTINE TESTING W REFLEX): HIV Screen 4th Generation wRfx: NONREACTIVE

## 2023-07-13 LAB — HEPARIN LEVEL (UNFRACTIONATED)
Heparin Unfractionated: 0.17 [IU]/mL — ABNORMAL LOW (ref 0.30–0.70)
Heparin Unfractionated: 0.27 [IU]/mL — ABNORMAL LOW (ref 0.30–0.70)
Heparin Unfractionated: 0.38 [IU]/mL (ref 0.30–0.70)
Heparin Unfractionated: 0.59 [IU]/mL (ref 0.30–0.70)

## 2023-07-13 LAB — HEMOGLOBIN AND HEMATOCRIT, BLOOD
HCT: 34.6 % — ABNORMAL LOW (ref 39.0–52.0)
Hemoglobin: 11.4 g/dL — ABNORMAL LOW (ref 13.0–17.0)

## 2023-07-13 MED ORDER — SODIUM CHLORIDE 0.9% FLUSH: 10.0000 mL | INTRAVENOUS | Status: DC | PRN

## 2023-07-13 MED ORDER — LOSARTAN POTASSIUM 25 MG PO TABS
12.5000 mg | ORAL_TABLET | Freq: Every day | ORAL | Status: DC
  Administered 2023-07-13: 12.5 mg via ORAL
  Filled 2023-07-13: qty 1

## 2023-07-13 MED ORDER — PANTOPRAZOLE SODIUM 40 MG PO TBEC
40.0000 mg | DELAYED_RELEASE_TABLET | Freq: Every day | ORAL | Status: DC
  Administered 2023-07-13 – 2023-07-15 (×3): 40 mg via ORAL
  Filled 2023-07-13 (×3): qty 1

## 2023-07-13 MED ORDER — CHLORHEXIDINE GLUCONATE CLOTH 2 % EX PADS
6.0000 | MEDICATED_PAD | Freq: Every day | CUTANEOUS | Status: DC
  Administered 2023-07-13 – 2023-07-15 (×2): 6 via TOPICAL

## 2023-07-13 MED ORDER — PANTOPRAZOLE SODIUM 40 MG IV SOLR: 40.0000 mg | INTRAVENOUS | Status: DC

## 2023-07-13 NOTE — Progress Notes (Signed)
Patient was scheduled to start cycle two today. He was admitted from the ED yesterday for thrombus. Will continue to follow for post discharge needs and office follow up.   Oncology Nurse Navigator Documentation     07/13/2023    8:30 AM  Oncology Nurse Navigator Flowsheets  Navigator Follow Up Date: 07/17/2023  Navigator Follow Up Reason: Appointment Review  Navigator Location [facility]  Navigator Encounter Type Appt/Treatment Plan Review  Patient Visit Type MedOnc  Treatment Phase Active Tx  Barriers/Navigation Needs Coordination of Care  Interventions None Required  Acuity Level 2-Minimal Needs (1-2 Barriers Identified)  Support Groups/Services Friends and Family  Time Spent with Patient 15

## 2023-07-13 NOTE — Progress Notes (Addendum)
PHARMACY - ANTICOAGULATION CONSULT NOTE  Pharmacy Consult for Heparin Indication: hrombus in the right gonadal/testicular vein   Allergies  Allergen Reactions   Bee Venom Anaphylaxis   Other Swelling    Bee allergy    Patient Measurements: Height: 6\' 1"  (185.4 cm) Weight: 90.7 kg (200 lb) IBW/kg (Calculated) : 79.9 Heparin Dosing Weight:    Vital Signs: Temp: 97.8 F (36.6 C) (01/27 0415) BP: 128/72 (01/27 0415) Pulse Rate: 56 (01/27 0415)  Labs: Recent Labs    07/12/23 0846 07/12/23 1725 07/12/23 2356 07/13/23 0729  HGB 10.9* 10.7* 10.7* 11.2*  HCT 32.7* 32.4* 32.8* 34.2*  PLT 175  --  187 197  HEPARINUNFRC  --   --  0.17* 0.27*  CREATININE 0.97  --  1.09 1.04    Estimated Creatinine Clearance: 118.4 mL/min (by C-G formula based on SCr of 1.04 mg/dL).   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 Merilynn Finland, PharmD, BCPS Clinical Staff Pharmacist Misty Stanley Stillinger 07/13/2023,9:45 AM

## 2023-07-13 NOTE — TOC CM/SW Note (Signed)
Transition of Care [facility]) - Inpatient Brief Assessment  Patient Details  Name: Zakai Gonyea MRN: [id number] Date of Birth: 1996-01-16  Transition of Care [facility]) CM/SW Contact:    Ewing Schlein, LCSW Phone Number: 07/13/2023, 1:47 PM  Clinical Narrative: Per chart review, patient does not have a PCP listed. CSW met with patient and spouse. Patient confirmed he sees a PCP at [facility] located at [address] [place], [place], [place] [address].  Transition of Care Asessment: Insurance and Status: Insurance coverage has been reviewed Patient has primary care physician: Yes Home environment has been reviewed: Resides in single family home with spouse Prior level of function:: Independent with ADLs at baseline Prior/Current Home Services: No current home services Social Drivers of Health Review: SDOH reviewed no interventions necessary Readmission risk has been reviewed: Yes Transition of care needs: no transition of care needs at this time

## 2023-07-13 NOTE — Progress Notes (Signed)
[name]                                                                               Daniel Shepherd, is a 28 y.o. male, DOB - 1995-10-22, ZOX:[id number] Admit date - 07/12/2023    Outpatient Primary MD for the patient is Default, Provider, MD  LOS - 0  days    Brief summary   Daniel Shepherd is a 28 y.o. 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 Myna Hidalgo who presented to emergency department complaints of rectal bleeding and lower abdominal pain.  He was recently constipated, they had some loose stools after treating the constipation with bowel movements normalizing in the past couple days. Patient was admitted for evaluation of blood in stools. GI consulted, recommended outpatient colonoscopy.    CT abdomen/pelvis with contrast showing an elongated soft tissue mass along the course of the right colon along with left testicular pain, inseparable from the anterior surface of the right psoas, highly concerning for tumor thrombus of right gonadal vein/testicular vein versus metastatic lymphadenopathy.  There is resultant moderate right hydronephrosis and upper hydroureter.  Oncology on board and recommendations given.   He was started on IV heparin and admitted for further evaluation.    Assessment & Plan    Assessment and Plan:   Hematochezia:  Painless bleeding. Intermittent, without significant drop in hemoglobin.  Pt refused rectal exam.  GI consulted and recommendations given to schedule outpatient colonoscopy.  Transfuse to keep hemoglobin greater than 7.  Check H&H 12 hours.    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  Oncology ordered MRI pelvis.     Testicular cancer: s/p right orchiectomy and chemotherapy.  Further recommendations as per oncology.    Mild normocytic anemia/ anemia of blood loss Monitor.     Neutropenia:  Pt empirically started on levaquin. Check differential.  Complete the 6 days .    Hypertension:  On losartan.    Hyponatremia:  Resolved.      Estimated body mass index is 26.39 kg/m as calculated from the following:   Height as of this encounter: 6\' 1"  (1.854 m).   Weight as of this encounter: 90.7 kg.  Code Status: full code.  DVT Prophylaxis:  IV heparin   Level of Care: Level of care: Med-Surg Family Communication: Updated patient's family at bedside.   Disposition Plan:     Remains inpatient appropriate:  pending clinical improvement.   Procedures:  MRI of the abdomen and pelvis.   Consultants:   Gastroenterology Oncology.   Antimicrobials:   Anti-infectives (From admission, onward)    Start     Dose/Rate Route Frequency Ordered Stop   07/12/23 1545  levofloxacin (LEVAQUIN) tablet 500 mg  Status:  Discontinued        500 mg Oral Daily 07/12/23 1449 07/13/23 1228        Medications  Scheduled Meds:  pantoprazole  40 mg Oral Daily   Continuous Infusions:  heparin 1,750 Units/hr (07/13/23 0949)   PRN Meds:.acetaminophen **OR** acetaminophen, HYDROcodone-acetaminophen, ondansetron **OR** ondansetron (ZOFRAN) IV  Subjective:   Ulis Kaps was seen and examined today.  Blood mixed with stools.   Objective:   Vitals:   07/12/23 2030 07/13/23 0107 07/13/23 0415 07/13/23 1216  BP: 129/62 130/78 128/72 (!) 144/89  Pulse: 70 62 (!) 56 66  Resp: 15 16 16 16   Temp: 98.5 F (36.9 C) 98.7 F (37.1 C) 97.8 F (36.6 C) 98.2 F (36.8 C)  TempSrc: Oral     SpO2: 97% 99% 99% 98%  Weight:      Height:        Intake/Output Summary (Last 24 hours) at 07/13/2023 1233 Last data filed at 07/13/2023 0407 Gross per 24 hour  Intake 407.85 ml  Output --  Net 407.85 ml   Filed Weights   07/12/23 0832  Weight: 90.7 kg     Exam General exam: Appears calm and comfortable  Respiratory system: Clear to auscultation. Respiratory  effort normal. Cardiovascular system: S1 & S2 heard, RRR. No JVD,  Gastrointestinal system: Abdomen is nondistended, soft and nontender.  Central nervous system: Alert and oriented. No focal neurological deficits. Extremities: Symmetric 5 x 5 power. Skin: No rashes, Psychiatry:Mood & affect appropriate.     Data Reviewed:  I have personally reviewed following labs and imaging studies   CBC Lab Results  Component Value Date   WBC 2.8 (L) 07/13/2023   RBC 3.73 (L) 07/13/2023   HGB 11.4 (L) 07/13/2023   HCT 34.6 (L) 07/13/2023   MCV 91.7 07/13/2023   MCH 30.0 07/13/2023   PLT 197 07/13/2023   MCHC 32.7 07/13/2023   RDW 14.2 07/13/2023   LYMPHSABS 0.8 07/08/2023   MONOABS 0.2 07/08/2023   EOSABS 0.0 07/08/2023   BASOSABS 0.0 07/08/2023     Last metabolic panel Lab Results  Component Value Date   NA 137 07/13/2023   K 4.0 07/13/2023   CL 102 07/13/2023   CO2 26 07/13/2023   BUN 12 07/13/2023   CREATININE 1.04 07/13/2023   GLUCOSE 102 (H) 07/13/2023   GFRNONAA >60 07/13/2023   GFRAA >60 11/01/2019   CALCIUM 9.1 07/13/2023   PROT 7.0 07/13/2023   ALBUMIN 3.6 07/13/2023   LABGLOB 2.6 10/29/2022   AGRATIO 1.8 10/29/2022   BILITOT 0.4 07/13/2023   ALKPHOS 73 07/13/2023   AST 17 07/13/2023   ALT 19 07/13/2023   ANIONGAP 9 07/13/2023    CBG (last 3)  No results for input(s): "GLUCAP" in the last 72 hours.    Coagulation Profile: No results for input(s): "INR", "PROTIME" in the last 168 hours.   Radiology Studies: CT ABDOMEN PELVIS W CONTRAST Result Date: 07/12/2023 CLINICAL DATA:  Abdominal pain, acute, nonlocalized. History of testicular carcinoma. * Tracking Code: BO * EXAM: CT ABDOMEN AND PELVIS WITH CONTRAST TECHNIQUE: Multidetector CT imaging of the abdomen and pelvis was performed using the standard protocol following bolus administration of intravenous contrast. RADIATION DOSE REDUCTION: This exam was performed according to the departmental  dose-optimization program which includes automated exposure control, adjustment of the mA and/or kV according to patient size and/or use of iterative reconstruction technique. CONTRAST:  OMNIPAQUE IOHEXOL 300 MG/ML  SOLN COMPARISON:  CT scan abdomen and pelvis from 10/23/2022. FINDINGS: Lower chest: The lung bases are clear. No pleural effusion. The heart is normal in size. No pericardial effusion. Hepatobiliary: The liver is normal in size. Non-cirrhotic configuration. No suspicious mass. There is a 5 mm hypoattenuating focus in the left hepatic dome (series 301, image 12), which is too small to adequately characterize  but appears unchanged since the prior study. No intrahepatic or extrahepatic bile duct dilation. No calcified gallstones. Normal gallbladder wall thickness. No pericholecystic inflammatory changes. Pancreas: Unremarkable. No pancreatic ductal dilatation or surrounding inflammatory changes. Spleen: Within normal limits. No focal lesion. Adrenals/Urinary Tract: Adrenal glands are unremarkable. No suspicious renal mass within the limitations of this unenhanced exam. There is new moderate right hydronephrosis secondary to an elongated soft tissue mass compressing the right upper/mid ureter, described in detail below. Right lower ureter is unremarkable. No left hydroureteronephrosis. No nephroureterolithiasis on either side. Unremarkable urinary bladder. Stomach/Bowel: No disproportionate dilation of the small or large bowel loops. No evidence of abnormal bowel wall thickening or inflammatory changes. The appendix was not visualized; however there is no acute inflammatory process in the right lower quadrant. Vascular/Lymphatic: No ascites or pneumoperitoneum. There is elongated soft tissue extending from the level of deep inguinal canal reaching up to the inferior vena cava along the course of the right gonadal vein, inseparable from the anterior surface of the right psoas muscle, highly concerning  for tumor thrombus of gonadal vein/metastatic lymphadenopathy in this patient with known history of testicular carcinoma. No aneurysmal dilation of the major abdominal arteries. 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 Reproductive: There are postsurgical changes in the right inguinal region from prior orchiectomy. Partially seen testicular prosthesis. Right spermatic cord is also not seen and likely surgically absent. Correlate with surgical history. Other: There is a tiny fat containing umbilical hernia. The soft tissues and abdominal wall are otherwise unremarkable. Musculoskeletal: No suspicious osseous lesions. IMPRESSION: 1. There is elongated soft tissue mass along the course of the right gonadal/testicular vein, inseparable from the anterior surface of the right psoas muscle, highly concerning for tumor thrombus of right gonadal vein/testicular vein versus metastatic lymphadenopathy. 2. There is resultant moderate right hydronephrosis and upper hydroureter. 3. Otherwise no metastatic disease identified within the abdomen or pelvis. 4. Multiple other nonacute observations, as described above. Electronically Signed   By: Jules Schick M.D.   On: 07/12/2023 11:00       Kathlen Mody M.D. [name] 07/13/2023, 12:33 PM  Available via Epic secure chat 7am-7pm After 7 pm, please refer to night coverage provider listed on amion.

## 2023-07-13 NOTE — Consult Note (Signed)
[facility] Gastroenterology Consult  Referring Provider: No ref. provider found Primary Care Physician:  Default, Provider, MD Primary Gastroenterologist: Gentry Fitz  Reason for Consultation: hematochezia  SUBJECTIVE:   HPI: Daniel Shepherd is a 28 y.o. male with past medical history significant for bilateral testicular cancer s/p orchiectomy on chemotherapy (no prior radiation therapy) who presented to hospital on 07/12/23 with hematochezia.   He is accompanied at bedside by his spouse. He noted never before, painless hematochezia starting yesterday. He had small amount of blood mixed in stool today, though less than prior. Had some nausea, no vomiting. Some lower abdominal pain, though this appears chronic. No chest pain or shortness of breath. He noted having significant constipation (from pain medications) in the recent past, as well as diarrhea.   CT scan of abdomen and pelvis completed on 07/12/23 showed no bowel dilatation, concern for tumor thrombus versus metastatic lymphadenopathy. He was started on IV heparin.   Hgb has been stable during admission.   No prior colonoscopy. He declined rectal examination today.  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   Testicular cancer [facility])    Past Surgical History:  Procedure Laterality Date   HYDROCELE EXCISION Left 07/03/2017   Procedure: HYDROCELECTOMY ADULT/ INGUINAL APPROACH/ TESTICULAR BIOPSY/ ORCHIECTOMY;  Surgeon: Ihor Gully, MD;  Location: [facility];  Service: Urology;  Laterality: Left;   IR FLUORO GUIDE PORT INSERTION RIGHT   07/09/2017   IR IMAGING GUIDED PORT INSERTION  06/19/2023   IR REMOVAL TUN ACCESS W/ PORT W/O FL MOD SED  10/12/2017   IR US GUIDE VASC ACCESS RIGHT  07/09/2017   NO PAST SURGERIES     ORCHIECTOMY     Prior to Admission medications   Medication Sig Start Date End Date Taking? Authorizing Provider  dexamethasone (DECADRON) 4 MG tablet Take 2 tablets (8 mg total) by mouth daily. Start the day after last cisplatin dose on day 6 of chemo x 3 days.Take with food. 06/22/23  Yes Ennever, Rose Phi, MD  HYDROcodone-acetaminophen (NORCO) 5-325 MG tablet Take 1 tablet by mouth every 6 (six) hours as needed for moderate pain (pain score 4-6). 06/23/23  Yes Ennever, Rose Phi, MD  levofloxacin (LEVAQUIN) 500 MG tablet Take 1 tablet (500 mg total) by mouth daily. 07/08/23  Yes Josph Macho, MD  lidocaine-prilocaine (EMLA) cream Apply to affected area once 06/22/23  Yes Ennever, Rose Phi, MD  losartan (COZAAR) 25 MG tablet Take 12.5 mg by mouth daily. 06/30/23  Yes [provider]  Multiple Vitamin (MULTIVITAMIN) tablet Take 1 tablet by mouth daily.   Yes [provider]  ondansetron (ZOFRAN) 8 MG tablet Take 1 tablet (8 mg total) by mouth every 8 (eight) hours as needed for nausea or vomiting. Start on the third day after last cisplatin dose on day 8 of chemo 06/22/23  Yes Ennever, Rose Phi, MD  prochlorperazine (COMPAZINE) 10 MG tablet Take 1 tablet (10 mg total) by mouth every 6 (six) hours as needed for nausea or vomiting. 06/22/23  Yes Josph Macho, MD  testosterone cypionate (  DEPOTESTOSTERONE CYPIONATE) 200 MG/ML injection Inject into the muscle every 7 (seven) days. 12/18/22  Yes [provider]   Current Facility-Administered Medications  Medication Dose Route Frequency Provider Last Rate Last Admin   acetaminophen (TYLENOL) tablet 650 mg  650 mg Oral Q6H PRN Bobette Mo, MD       Or   acetaminophen (TYLENOL) suppository 650 mg  650 mg Rectal Q6H PRN Bobette Mo, MD        heparin ADULT infusion 100 units/mL (25000 units/237mL)  1,750 Units/hr Intravenous Continuous Norva Pavlov, RPH 17.5 mL/hr at 07/13/23 0949 1,750 Units/hr at 07/13/23 0949   HYDROcodone-acetaminophen (NORCO/VICODIN) 5-325 MG per tablet 1 tablet  1 tablet Oral Q6H PRN Bobette Mo, MD       levofloxacin [facility]) tablet 500 mg  500 mg Oral Daily Bobette Mo, MD   500 mg at 07/13/23 0934   ondansetron [facility]) tablet 4 mg  4 mg Oral Q6H PRN Bobette Mo, MD       Or   ondansetron [facility]) injection 4 mg  4 mg Intravenous Q6H PRN Bobette Mo, MD       pantoprazole (PROTONIX) injection 40 mg  40 mg Intravenous Q24H Kathlen Mody, MD       Facility-Administered Medications Ordered in Other Encounters  Medication Dose Route Frequency Provider Last Rate Last Admin   0.9 %  sodium chloride infusion   Intravenous Continuous Josph Macho, MD   Stopped at 06/26/23 1112   pegfilgrastim (NEULASTA ONPRO KIT) injection 6 mg  6 mg Subcutaneous Once Erenest Blank, NP       sodium chloride flush (NS) 0.9 % injection 10 mL  10 mL Intracatheter PRN Josph Macho, MD   10 mL at 06/26/23 1352   Allergies as of 07/12/2023 - Review Complete 07/12/2023  Allergen Reaction Noted   Bee venom Anaphylaxis 05/07/2023   Other Swelling 02/04/2020   Family History  Problem Relation Age of Onset   Healthy Mother    Healthy Father    Colon cancer Other        Lynch syndrome   Breast cancer Maternal Great-grandmother    Social History   Socioeconomic History   Marital status: Married    Spouse name: Not on file   Number of children: Not on file   Years of education: Not on file   Highest education level: Not on file  Occupational History   Not on file  Tobacco Use   Smoking status: Never   Smokeless tobacco: Never  Vaping Use   Vaping status: Never Used  Substance and Sexual Activity   Alcohol use: Yes    Comment: seldom   Drug use: Yes    Types: Marijuana    Sexual activity: Yes  Other Topics Concern   Not on file  Social History Narrative   Not on file   Social Drivers of Health   Financial Resource Strain: Low Risk  (06/30/2023)   Received from [facility]   Overall Financial Resource Strain (CARDIA)    Difficulty of Paying Living Expenses: Not hard at all  Food Insecurity: No Food Insecurity (07/12/2023)   Hunger Vital Sign    Worried About Running Out of Food in the Last Year: Never true    Ran Out of Food in the Last Year: Never true  Transportation Needs: No Transportation Needs (07/12/2023)   PRAPARE - Administrator, Civil Service (Medical): No  Lack of Transportation (Non-Medical): No  Physical Activity: Not on file  Stress: Not on file  Social Connections: Unknown (10/25/2021)   [facility] from [facility]   Social Network    Social Network: Not on file  Intimate Partner Violence: Not At Risk (07/12/2023)   Humiliation, Afraid, Rape, and Kick questionnaire    Fear of Current or Ex-Partner: No    Emotionally Abused: No    Physically Abused: No    Sexually Abused: No   Review of Systems:  Review of Systems  Respiratory:  Negative for shortness of breath.   Cardiovascular:  Negative for chest pain.  Gastrointestinal:  Positive for abdominal pain, blood in stool and nausea. Negative for vomiting.    OBJECTIVE:   Temp:  [97.8 F (36.6 C)-98.7 F (37.1 C)] 97.8 F (36.6 C) (01/27 0415) Pulse Rate:  [56-70] 56 (01/27 0415) Resp:  [15-18] 16 (01/27 0415) BP: (128-143)/(62-87) 128/72 (01/27 0415) SpO2:  [97 %-100 %] 99 % (01/27 0415) Last BM Date : 07/12/23 Physical Exam Constitutional:      General: He is not in acute distress.    Appearance: He is not ill-appearing, toxic-appearing or diaphoretic.  Cardiovascular:     Rate and Rhythm: Normal rate and regular rhythm.  Pulmonary:     Effort: No respiratory distress.     Breath sounds: Normal breath sounds.  Abdominal:     General: Bowel sounds are  normal. There is no distension.     Palpations: Abdomen is soft.     Tenderness: There is no abdominal tenderness. There is no guarding.  Genitourinary:    Comments: Patient declined bedside rectal examination. Musculoskeletal:     Right lower leg: No edema.     Left lower leg: No edema.  Skin:    General: Skin is warm and dry.  Neurological:     Mental Status: He is alert.     Labs: Recent Labs    07/12/23 0846 07/12/23 1725 07/12/23 2356 07/13/23 0729  WBC 2.0*  --  2.8* 2.8*  HGB 10.9* 10.7* 10.7* 11.2*  HCT 32.7* 32.4* 32.8* 34.2*  PLT 175  --  187 197   BMET Recent Labs    07/12/23 0846 07/12/23 2356 07/13/23 0729  NA 134* 138 137  K 4.0 3.8 4.0  CL 101 103 102  CO2 26 26 26   GLUCOSE 101* 104* 102*  BUN 14 15 12   CREATININE 0.97 1.09 1.04  CALCIUM 8.9 8.8* 9.1   LFT Recent Labs    07/13/23 0729  PROT 7.0  ALBUMIN 3.6  AST 17  ALT 19  ALKPHOS 73  BILITOT 0.4   PT/INR No results for input(s): "LABPROT", "INR" in the last 72 hours.  Diagnostic imaging: CT ABDOMEN PELVIS W CONTRAST Result Date: 07/12/2023 CLINICAL DATA:  Abdominal pain, acute, nonlocalized. History of testicular carcinoma. * Tracking Code: BO * EXAM: CT ABDOMEN AND PELVIS WITH CONTRAST TECHNIQUE: Multidetector CT imaging of the abdomen and pelvis was performed using the standard protocol following bolus administration of intravenous contrast. RADIATION DOSE REDUCTION: This exam was performed according to the departmental dose-optimization program which includes automated exposure control, adjustment of the mA and/or kV according to patient size and/or use of iterative reconstruction technique. CONTRAST:  OMNIPAQUE IOHEXOL 300 MG/ML  SOLN COMPARISON:  CT scan abdomen and pelvis from 10/23/2022. FINDINGS: Lower chest: The lung bases are clear. No pleural effusion. The heart is normal in size. No pericardial effusion. Hepatobiliary: The liver is normal in size.  Non-cirrhotic configuration.  No suspicious mass. There is a 5 mm hypoattenuating focus in the left hepatic dome (series 301, image 12), which is too small to adequately characterize but appears unchanged since the prior study. No intrahepatic or extrahepatic bile duct dilation. No calcified gallstones. Normal gallbladder wall thickness. No pericholecystic inflammatory changes. Pancreas: Unremarkable. No pancreatic ductal dilatation or surrounding inflammatory changes. Spleen: Within normal limits. No focal lesion. Adrenals/Urinary Tract: Adrenal glands are unremarkable. No suspicious renal mass within the limitations of this unenhanced exam. There is new moderate right hydronephrosis secondary to an elongated soft tissue mass compressing the right upper/mid ureter, described in detail below. Right lower ureter is unremarkable. No left hydroureteronephrosis. No nephroureterolithiasis on either side. Unremarkable urinary bladder. Stomach/Bowel: No disproportionate dilation of the small or large bowel loops. No evidence of abnormal bowel wall thickening or inflammatory changes. The appendix was not visualized; however there is no acute inflammatory process in the right lower quadrant. Vascular/Lymphatic: No ascites or pneumoperitoneum. There is elongated soft tissue extending from the level of deep inguinal canal reaching up to the inferior vena cava along the course of the right gonadal vein, inseparable from the anterior surface of the right psoas muscle, highly concerning for tumor thrombus of gonadal vein/metastatic lymphadenopathy in this patient with known history of testicular carcinoma. No aneurysmal dilation of the major abdominal arteries. 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 Reproductive: There are postsurgical changes in the right inguinal region from prior orchiectomy. Partially seen testicular prosthesis. Right spermatic cord is also not seen and likely surgically absent. Correlate with surgical history. Other: There  is a tiny fat containing umbilical hernia. The soft tissues and abdominal wall are otherwise unremarkable. Musculoskeletal: No suspicious osseous lesions. IMPRESSION: 1. There is elongated soft tissue mass along the course of the right gonadal/testicular vein, inseparable from the anterior surface of the right psoas muscle, highly concerning for tumor thrombus of right gonadal vein/testicular vein versus metastatic lymphadenopathy. 2. There is resultant moderate right hydronephrosis and upper hydroureter. 3. Otherwise no metastatic disease identified within the abdomen or pelvis. 4. Multiple other nonacute observations, as described above. Electronically Signed   By: Jules Schick M.D.   On: 07/12/2023 11:00    IMPRESSION: Hematochezia Normocytic anemia, stable Testicular cancer, on chemotherapy  Concern for tumor thrombus versus metastatic lymphadenopathy on CT imaging 07/12/23  PLAN: -Pattern of bleeding (including recent constipation) and stability of Hgb, raises suspicion for hemorrhoidal bleeding, patient declined rectal examination today, he is on heparin infusion  -Would recommend outpatient colonoscopy to further investigate his rectal bleeding, would need to hold oral anticoagulant for procedure -Ok for diet from GI perspective, I will place my office contact information in chart for patient to schedule appointment to coordinate colonoscopy  -Trend Hgb -If bleeding were to worsen or Hgb were to drop significantly, could consider inpatient colonoscopy -[facility] will be available as needed should questions arise   LOS: 0 days   Liliane Shi, [facility]

## 2023-07-13 NOTE — Consult Note (Signed)
[name] is well-known to me.  He is a very nice 28 year old white male.  He has a very unusual history.  He is actually had a history of bilateral testicular cancer.  He had a nonseminoma in the left side back in 2018.  He was treated with chemotherapy for this.  This was stage III disease.  He has been cured of this.  The year ago, he was doing well and was found to have a mass in the right testicle.  This was seminoma.  This was stage I after orchiectomy.  He subsequently had recurrence of the seminoma.  This was biopsied late last year.  He had a psoas mass.  We started him on systemic chemotherapy with BEP.  He has had 1 cycle.  He has for cycle on 06/22/2023.  Over the weekend, he had episode of bright red blood per rectum.  Is ongoing the ER.  He had scans done.  There was concern for thrombus in the right gonadal/testicular vein.  This is probable from the anterior surface of the right psoas muscle.  Of note, the right psoas muscle is where he had the recurrence.  There was some moderate right hydronephrosis.  His labs all look good.  He does not have any renal insufficiency.  Has been started on anticoagulation.  There is been no further GI bleeding.  He has had no abdominal pain.  He has had no urinary symptoms.  He has had no cough or shortness of breath.  When he came in, his white cell count was 2.0.  Hemoglobin 10.9.  Platelet count 175,000.  ANC was 0.2.  Of course, there is no labs back yet today.  He has had no rashes.  There is been no leg swelling.  He has had no cough or shortness of breath.  He has had no obvious fever.   His vital signs are temperature 97.8.  Pulse 56.  Blood pressure 128/72.  Head and neck exam shows no ocular or oral lesions.  Has no palpable cervical or supraclavicular lymph nodes.  Lungs are clear bilaterally.  He has good air movement bilaterally.  Cardiac exam regular rate and rhythm.  He has no murmurs.  Abdomen is soft.  Bowel sounds are present.   There is no guarding or rebound tenderness.  He has no fluid wave.  He has no palpable liver or spleen tip.  Extremity shows no clubbing, cyanosis or edema.  Neurological exam shows no focal neurological deficits.    Daniel Shepherd is a very nice 28 year old male.  We are treating him for recurrent seminoma of the right testicle.  He has had 1 cycle of chemotherapy.  He now is admitted with a possible thrombus in the right gonadal vein.  I am wondering if this is a vascular problem or a tumor problem.  I would have to think that this would be more of a vascular issue.  Maybe, an MRI might be able to help [place] out.  I ordered both a MRI of the abdomen and an MRI of the pelvis.  I am not sure which one would be appropriate for this situation.  I will continue him on the anticoagulation.  I am sure that we can get him onto oral anticoagulation.  It would be nice to see what his beta-hCG is.  I do not see problems with this hydronephrosis.  His renal function is okay.  Again I had to believe that this is related to the recurrence  that he had in the right psoas muscle.  I do appreciate everybody's help.  Will try to sort this out quickly so he can go home.  Again I suspect if this is vascular, we will be able to get him on oral anticoagulation.    Christin Bach, MD  Fayrene Fearing 1:5

## 2023-07-13 NOTE — Plan of Care (Signed)

## 2023-07-13 NOTE — Progress Notes (Signed)
PHARMACY - ANTICOAGULATION CONSULT NOTE  Pharmacy Consult for heparin  Indication: Groin area tumoral thrombosis  Allergies  Allergen Reactions   Bee Venom Anaphylaxis   Other Swelling    Bee allergy    Patient Measurements: Height: 6\' 1"  (185.4 cm) Weight: 90.7 kg (200 lb) IBW/kg (Calculated) : 79.9 Heparin Dosing Weight: 90.7 kg  Vital Signs: Temp: 98.5 F (36.9 C) (01/26 2030) Temp Source: Oral (01/26 2030) BP: 129/62 (01/26 2030) Pulse Rate: 70 (01/26 2030)  Labs: Recent Labs    07/12/23 0846 07/12/23 1725 07/12/23 2356  HGB 10.9* 10.7*  --   HCT 32.7* 32.4*  --   PLT 175  --   --   HEPARINUNFRC  --   --  0.17*  CREATININE 0.97  --   --     Estimated Creatinine Clearance: 127 mL/min (by C-G formula based on SCr of 0.97 mg/dL).   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 Pharmacy consulted to dose heparin for R groin area tumoral thrombosis per Onc recs.   Pt CC: hematochezia this morning. Pt reports this morning when he went to the bathroom he saw BRBPR.  Hg 10.9 ( was 10.4 on 07/08/23, PLT 175 (was 124 07/07/22) , SCr 0.97  HL 0.17 subtherapeutic on 1350 units/hr Hgb 10.7 No bleeding noted  Goal of Therapy:  Heparin level 0.3-0.7 units/ml Monitor platelets by anticoagulation protocol: Yes   Plan:  Increase heparin drip to 1550 units/hr Check heparin level 6 hours  Daily CBC & heparin level F/u hematochezia  Arley Phenix RPh 07/13/2023, 12:30 AM

## 2023-07-13 NOTE — Progress Notes (Signed)
PHARMACY - ANTICOAGULATION CONSULT NOTE  Pharmacy Consult for heparin  Indication: Groin area tumoral thrombosis  Allergies  Allergen Reactions   Bee Venom Anaphylaxis   Other Swelling    Bee allergy    Patient Measurements: Height: 6\' 1"  (185.4 cm) Weight: 90.7 kg (200 lb) IBW/kg (Calculated) : 79.9 Heparin Dosing Weight: 90.7 kg  Vital Signs: Temp: 98.2 F (36.8 C) (01/27 1216) BP: 144/89 (01/27 1216) Pulse Rate: 66 (01/27 1216)  Labs: Recent Labs    07/12/23 0846 07/12/23 1725 07/12/23 2356 07/13/23 0729 07/13/23 1200 07/13/23 1639  HGB 10.9*   < > 10.7* 11.2* 11.4*  --   HCT 32.7*   < > 32.8* 34.2* 34.6*  --   PLT 175  --  187 197  --   --   HEPARINUNFRC  --   --  0.17* 0.27*  --  0.59  CREATININE 0.97  --  1.09 1.04  --   --    < > = values in this interval not displayed.    Estimated Creatinine Clearance: 118.4 mL/min (by C-G formula based on SCr of 1.04 mg/dL).   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 Pharmacy consulted to dose heparin for R groin area tumoral thrombosis per Onc recs.   Pt CC: hematochezia this morning. Pt reports this morning when he went to the bathroom he saw BRBPR.  Hg 10.9 ( was 10.4 on 07/08/23, PLT 175 (was 124 07/07/22) , SCr 0.97  07/13/2023: Heparin level 0.59- now therapeutic on IV heparin 1750 units/hr CBC: Hg 11.4-low/stable, pltc WNL No bleeding or infusion concerns reported by RN  Goal of  Therapy:  Heparin level 0.3-0.7 units/ml Monitor platelets by anticoagulation protocol: Yes   Plan:  Continue heparin drip at 1750 units/hr Check confirmatory heparin level in 6 hours  Daily CBC & heparin level  Junita Push, PharmD, BCPS 07/13/2023, 5:56 PM

## 2023-07-14 ENCOUNTER — Inpatient Hospital Stay (HOSPITAL_COMMUNITY): Payer: Managed Care, Other (non HMO)

## 2023-07-14 ENCOUNTER — Other Ambulatory Visit: Payer: Self-pay

## 2023-07-14 ENCOUNTER — Inpatient Hospital Stay: Payer: Managed Care, Other (non HMO)

## 2023-07-14 DIAGNOSIS — I8290 Acute embolism and thrombosis of unspecified vein: Secondary | ICD-10-CM | POA: Diagnosis not present

## 2023-07-14 DIAGNOSIS — R1909 Other intra-abdominal and pelvic swelling, mass and lump: Secondary | ICD-10-CM | POA: Diagnosis not present

## 2023-07-14 DIAGNOSIS — D709 Neutropenia, unspecified: Secondary | ICD-10-CM | POA: Diagnosis not present

## 2023-07-14 DIAGNOSIS — E871 Hypo-osmolality and hyponatremia: Secondary | ICD-10-CM | POA: Diagnosis not present

## 2023-07-14 LAB — HEPARIN LEVEL (UNFRACTIONATED): Heparin Unfractionated: 0.48 [IU]/mL (ref 0.30–0.70)

## 2023-07-14 LAB — CBC
HCT: 33 % — ABNORMAL LOW (ref 39.0–52.0)
Hemoglobin: 11.1 g/dL — ABNORMAL LOW (ref 13.0–17.0)
MCH: 30.5 pg (ref 26.0–34.0)
MCHC: 33.6 g/dL (ref 30.0–36.0)
MCV: 90.7 fL (ref 80.0–100.0)
Platelets: 201 10*3/uL (ref 150–400)
RBC: 3.64 MIL/uL — ABNORMAL LOW (ref 4.22–5.81)
RDW: 14.4 % (ref 11.5–15.5)
WBC: 3.5 10*3/uL — ABNORMAL LOW (ref 4.0–10.5)
nRBC: 0 % (ref 0.0–0.2)

## 2023-07-14 LAB — COMPREHENSIVE METABOLIC PANEL
ALT: 19 U/L (ref 0–44)
AST: 19 U/L (ref 15–41)
Albumin: 3.4 g/dL — ABNORMAL LOW (ref 3.5–5.0)
Alkaline Phosphatase: 77 U/L (ref 38–126)
Anion gap: 9 (ref 5–15)
BUN: 17 mg/dL (ref 6–20)
CO2: 26 mmol/L (ref 22–32)
Calcium: 8.7 mg/dL — ABNORMAL LOW (ref 8.9–10.3)
Chloride: 100 mmol/L (ref 98–111)
Creatinine, Ser: 1.55 mg/dL — ABNORMAL HIGH (ref 0.61–1.24)
GFR, Estimated: >60 mL/min (ref >60)
Glucose, Bld: 100 mg/dL — ABNORMAL HIGH (ref 70–99)
Potassium: 3.7 mmol/L (ref 3.5–5.1)
Sodium: 135 mmol/L (ref 135–145)
Total Bilirubin: 0.4 mg/dL (ref 0.0–1.2)
Total Protein: 6.8 g/dL (ref 6.5–8.1)

## 2023-07-14 LAB — BETA HCG QUANT (REF LAB): hCG Quant: <1 m[IU]/mL (ref 0–3)

## 2023-07-14 MED ORDER — GADOBUTROL 1 MMOL/ML IV SOLN
9.0000 mL | Freq: Once | INTRAVENOUS | Status: AC | PRN
  Administered 2023-07-14: 9 mL via INTRAVENOUS

## 2023-07-14 MED ORDER — LACTATED RINGERS IV SOLN: INTRAVENOUS | Status: AC

## 2023-07-14 NOTE — Progress Notes (Signed)
Daniel Shepherd will be having a colonoscopy today.  I would have to believe that he has some lower GI source for this bleeding.  Maybe, he has a internal hemorrhoid.  Maybe, there is diverticulosis.  He is on heparin.  I will go ahead and just stop the heparin for right now.  I do not know why he has always peripheral IVs.  We can get rid of the peripheral IVs and just use the Port-A-Cath.  His labs show white cell count 3.5.  Hemoglobin 11.1.  Platelet count 201,000.  Sodium 135.  Potassium 3.7.  BUN 17 creatinine 1.55.  Calcium 8.7 with albumin of 3.4.  Not sure why the creatinine has bumped up.  He is urinating without any difficulties.  He has not yet had the MRI.  I do not suspect that this gonadal vein thrombus is a vascular issue and not a tumor issue.  Otherwise, he is doing okay.  There is no cough.  There is no shortness of breath.  He has had no leg swelling.  He has had no fever.  There is been no obvious cough or shortness of breath.   Vital signs are temperature 98.2.  Pulse is 65.  Blood pressure 118/57.  His lungs are clear bilaterally.  Cardiac exam regular rate and rhythm.  Abdomen is soft.  Bowel sounds are present.  There is no guarding or rebound tenderness.  There is no abdominal mass.  Extremity shows no clubbing, cyanosis or edema.  Neurological exam is nonfocal.  We will see what the colonoscopy shows.  Again, I cannot imagine this being anything related to malignancy.  We will probably plan to get him on oral anticoagulation after his procedure.  I would think that the latest he would go home would be tomorrow.  Again we will have to watch his creatinine.   Christin Bach, MD  Charmian Muff 6:5

## 2023-07-14 NOTE — Consult Note (Cosign Needed)
Urology Consult Note   Requesting Attending Physician:  Kathlen Mody, MD Service Providing Consult: Urology  Consulting Attending: Dr. Berneice Heinrich   Reason for Consult:  hydronephrosis  HPI: Daniel Shepherd is seen in consultation for reasons noted above at the request of Kathlen Mody, MD. [facility] significant for b/l testicular cancer- s/p orchiectomy in 2019 and 2024 respectively. Pt had stg III disease with pulmonary mets with 1st diagnosis. His is still undergoing chemotherapy at his time. He presents to ED with hematochezia x1d. CT A/P revealed soft tissue mass and associated right hydronephrosis. Urology was asked to speak to these findings.   ------------------  Assessment:  28 y.o. male with testicular cancer, s/p b/l orchiectomy   Recommendations: #hydronephrosis #AKI  CT A/P: "There is elongated soft tissue mass along the course of the right gonadal/testicular vein, inseparable from the anterior surface of the right psoas muscle, highly concerning for tumor thrombus of right gonadal vein/testicular vein versus metastatic. Lymphadenopathy. There is resultant moderate right hydronephrosis and upper hydroureter."  AKI has started while pt was in hospital. He reports dehydration with concentrated urine. Started with IV fluids x 1d and reassess with am labs. If worse, may benefit from ureteral stent place ment to maximize renal function for ongoing chemotherapy. Briefly discussed possible need for PCNT in the future in the case of stent failure 2/2 extrinsic compression.  NPO at midnight  Case and plan discussed with Dr. 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   Testicular cancer [facility])     Past Surgical History:  Past Surgical History:  Procedure Laterality Date   HYDROCELE EXCISION Left 07/03/2017   Procedure: HYDROCELECTOMY ADULT/ INGUINAL APPROACH/ TESTICULAR BIOPSY/ ORCHIECTOMY;  Surgeon: Ihor Gully, MD;  Location: [facility];  Service: Urology;  Laterality: Left;   IR FLUORO GUIDE PORT INSERTION RIGHT  07/09/2017   IR IMAGING GUIDED PORT INSERTION  06/19/2023   IR REMOVAL TUN ACCESS W/ PORT W/O FL MOD SED  10/12/2017   IR US GUIDE VASC ACCESS RIGHT  07/09/2017   NO PAST SURGERIES     ORCHIECTOMY      Medication: Current Facility-Administered Medications  Medication Dose Route Frequency Provider Last Rate Last Admin   acetaminophen (TYLENOL) tablet 650 mg  650 mg Oral Q6H PRN Bobette Mo, MD       Or   acetaminophen (TYLENOL) suppository 650 mg  650 mg Rectal Q6H PRN Bobette Mo, MD       Chlorhexidine Gluconate Cloth 2 % PADS 6 each  6 each Topical Daily Kathlen Mody, MD   6 each at 07/13/23 1821   heparin ADULT infusion 100 units/mL (25000 units/266mL)  1,750 Units/hr Intravenous Continuous Norva Pavlov, RPH 17.5 mL/hr at 07/14/23 0047 1,750 Units/hr at 07/14/23 0047   HYDROcodone-acetaminophen (NORCO/VICODIN) 5-325 MG per tablet 1 tablet  1 tablet Oral Q6H PRN Bobette Mo, MD       losartan (COZAAR) tablet 12.5 mg  12.5 mg Oral Daily Kathlen Mody, MD   12.5 mg at 07/13/23  1753   ondansetron (ZOFRAN) tablet 4 mg  4 mg Oral Q6H PRN Bobette Mo, MD       Or   ondansetron [facility]) injection 4 mg  4 mg Intravenous Q6H PRN Bobette Mo, MD       pantoprazole (PROTONIX) EC tablet 40 mg  40 mg Oral Daily Norva Pavlov, [place]   40 mg at 07/13/23 1237   sodium chloride flush (NS) 0.9 % injection 10-40 mL  10-40 mL Intracatheter PRN Kathlen Mody, MD        Facility-Administered Medications Ordered in Other Encounters  Medication Dose Route Frequency Provider Last Rate Last Admin   0.9 %  sodium chloride infusion   Intravenous Continuous Josph Macho, MD   Stopped at 06/26/23 1112   pegfilgrastim (NEULASTA ONPRO KIT) injection 6 mg  6 mg Subcutaneous Once Erenest Blank, NP       sodium chloride flush (NS) 0.9 % injection 10 mL  10 mL Intracatheter PRN Josph Macho, MD   10 mL at 06/26/23 1352    Allergies: Allergies  Allergen Reactions   Bee Venom Anaphylaxis   Other Swelling    Bee allergy    Social History: Social History   Tobacco Use   Smoking status: Never   Smokeless tobacco: Never  Vaping Use   Vaping status: Never Used  Substance Use Topics   Alcohol use: Yes    Comment: seldom   Drug use: Yes    Types: Marijuana    Family History Family History  Problem Relation Age of Onset   Healthy Mother    Healthy Father    Colon cancer Other        Lynch syndrome   Breast cancer Maternal Great-grandmother     Review of Systems  Genitourinary:  Negative for dysuria, flank pain, frequency, hematuria and urgency.     Objective   Vital signs in last 24 hours: BP (!) 118/57 (BP Location: Left Arm)   Pulse 65   Temp 98.2 F (36.8 C)   Resp 16   Ht 6\' 1"  (1.854 m)   Wt 90.7 kg   SpO2 98%   BMI 26.39 kg/m   Physical Exam General: A&O, resting, appropriate HEENT: [place]/AT Pulmonary: Normal work of breathing Cardiovascular: no cyanosis   Most Recent Labs: Lab Results  Component Value Date   WBC 3.5 (L) 07/14/2023   HGB 11.1 (L) 07/14/2023   HCT 33.0 (L) 07/14/2023   PLT 201 07/14/2023    Lab Results  Component Value Date   NA 135 07/14/2023   K 3.7 07/14/2023   CL 100 07/14/2023   CO2 26 07/14/2023   BUN 17 07/14/2023   CREATININE 1.55 (H) 07/14/2023   CALCIUM 8.7 (L) 07/14/2023   MG 1.7 06/30/2023    Lab Results  Component Value Date   INR 1.05 10/12/2017   APTT 41 (H) 07/09/2017      Urine Culture: @[id number] (laburin,org,[id number])@   IMAGING: CT ABDOMEN PELVIS W CONTRAST Result Date: 07/12/2023 CLINICAL DATA:  Abdominal pain, acute, nonlocalized. History of testicular carcinoma. * Tracking Code: BO * EXAM: CT ABDOMEN AND PELVIS WITH CONTRAST TECHNIQUE: Multidetector CT imaging of the abdomen and pelvis was performed using the standard protocol following bolus administration of intravenous contrast. RADIATION DOSE REDUCTION: This exam was performed according to the departmental dose-optimization program which includes automated exposure control, adjustment of the mA and/or kV according to patient size and/or use of iterative reconstruction technique. CONTRAST:   OMNIPAQUE IOHEXOL 300 MG/ML  SOLN COMPARISON:  CT scan abdomen and pelvis from 10/23/2022. FINDINGS: Lower chest: The lung bases are clear. No pleural effusion. The heart is normal in size. No pericardial effusion. Hepatobiliary: The liver is normal in size. Non-cirrhotic configuration. No suspicious mass. There is a 5 mm hypoattenuating focus in the left hepatic dome (series 301, image 12), which is too small to adequately characterize but appears unchanged since the prior study. No intrahepatic or extrahepatic bile duct dilation. No calcified gallstones. Normal gallbladder wall thickness. No pericholecystic inflammatory changes. Pancreas: Unremarkable. No pancreatic ductal dilatation or surrounding inflammatory changes. Spleen: Within normal limits. No focal lesion. Adrenals/Urinary Tract: Adrenal glands are unremarkable. No suspicious renal mass within the limitations of this unenhanced exam. There is new moderate right hydronephrosis secondary to an elongated soft tissue mass compressing the right upper/mid ureter, described in detail below. Right lower ureter is unremarkable. No left hydroureteronephrosis. No nephroureterolithiasis on either side. Unremarkable urinary bladder. Stomach/Bowel: No disproportionate  dilation of the small or large bowel loops. No evidence of abnormal bowel wall thickening or inflammatory changes. The appendix was not visualized; however there is no acute inflammatory process in the right lower quadrant. Vascular/Lymphatic: No ascites or pneumoperitoneum. There is elongated soft tissue extending from the level of deep inguinal canal reaching up to the inferior vena cava along the course of the right gonadal vein, inseparable from the anterior surface of the right psoas muscle, highly concerning for tumor thrombus of gonadal vein/metastatic lymphadenopathy in this patient with known history of testicular carcinoma. No aneurysmal dilation of the major abdominal arteries. 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 Reproductive: There are postsurgical changes in the right inguinal region from prior orchiectomy. Partially seen testicular prosthesis. Right spermatic cord is also not seen and likely surgically absent. Correlate with surgical history. Other: There is a tiny fat containing umbilical hernia. The soft tissues and abdominal wall are otherwise unremarkable. Musculoskeletal: No suspicious osseous lesions. IMPRESSION: 1. There is elongated soft tissue mass along the course of the right gonadal/testicular vein, inseparable from the anterior surface of the right psoas muscle, highly concerning for tumor thrombus of right gonadal vein/testicular vein versus metastatic lymphadenopathy. 2. There is resultant moderate right hydronephrosis and upper hydroureter. 3. Otherwise no metastatic disease identified within the abdomen or pelvis. 4. Multiple other nonacute observations, as described above. Electronically Signed   By: Jules Schick M.D.   On: 07/12/2023 11:00    ------  Elmon Kirschner, NP Pager: [phone]   Please contact the urology consult pager with any further questions/concerns.

## 2023-07-14 NOTE — Progress Notes (Signed)
[name]                                                                               Daniel Shepherd, is a 28 y.o. male, DOB - Nov 25, 1995, ZOX:[id number] Admit date - 07/12/2023    Outpatient Primary MD for the patient is Default, Provider, MD  LOS - 1  days    Brief summary   Daniel Shepherd is a 28 y.o. 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 Myna Hidalgo who presented to emergency department complaints of rectal bleeding and lower abdominal pain.  He was recently constipated, they had some loose stools after treating the constipation with bowel movements normalizing in the past couple days. Patient was admitted for evaluation of blood in stools. GI consulted, recommended outpatient colonoscopy.    CT abdomen/pelvis with contrast showing an elongated soft tissue mass along the course of the right colon along with left testicular pain, inseparable from the anterior surface of the right psoas, highly concerning for tumor thrombus of right gonadal vein/testicular vein versus metastatic lymphadenopathy.  There is resultant moderate right hydronephrosis and upper hydroureter.  Oncology on board and recommendations given.   He was started on IV heparin and admitted for further evaluation.    Assessment & Plan    Assessment and Plan:   Hematochezia:  Painless  rectal bleeding. Intermittent, without significant drop in hemoglobin.  Pt refused rectal exam.  GI consulted and recommendations given, to schedule outpatient colonoscopy.  Transfuse to keep hemoglobin greater than 7.  Hemoglobin stable around 11.    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  Dr Myna Hidalgo ordered MRI abd and pelvis.     AKI with right hydronephrosis and hydroureter. Unclear etiology, differential include from poor renal perfusion vs obstruction.  MRI Pelvis  shows Moderate right hydronephrosis and proximal hydroureter, the right ureter obstructed proximally by retroperitoneal lymphadenopathy, also evident on CT from 04/2023.  Urology consulted, to see if he needs ureteral stent.  Gently hydrate and repeat renal parameters in am.    Testicular cancer with nodal spread: s/p bilateral orchiectomy and chemotherapy.  Further recommendations as per oncology.    Mild normocytic anemia/ anemia of blood loss Hemoglobin remains stale around 11.    Neutropenia:  Improving. Check cbc with differential in am.    Hypertension:  Optimal. Losartan held for AKI.    Hyponatremia:  Resolved.      Estimated body mass index is 26.39 kg/m as calculated from the following:   Height as of this encounter: 6\' 1"  (1.854 m).   Weight as of this encounter: 90.7 kg.  Code Status: full code.  DVT Prophylaxis:  IV heparin   Level of Care: Level of care: Med-Surg Family Communication: Updated patient's family at bedside.   Disposition Plan:     Remains inpatient appropriate:  pending clinical improvement.   Procedures:  MRI of the abdomen and pelvis.   Consultants:   Gastroenterology Oncology.  Urology.   Antimicrobials:   Anti-infectives (From admission, onward)    Start     Dose/Rate Route Frequency  Ordered Stop   07/12/23 1545  levofloxacin (LEVAQUIN) tablet 500 mg  Status:  Discontinued        500 mg Oral Daily 07/12/23 1449 07/13/23 1228        Medications  Scheduled Meds:  Chlorhexidine Gluconate Cloth  6 each Topical Daily   pantoprazole  40 mg Oral Daily   Continuous Infusions:  heparin 1,750 Units/hr (07/14/23 1316)   lactated ringers 100 mL/hr at 07/14/23 1309   PRN Meds:.acetaminophen **OR** acetaminophen, HYDROcodone-acetaminophen, ondansetron **OR** ondansetron (ZOFRAN) IV, sodium chloride flush    Subjective:   Daniel Shepherd was seen and examined today.  No BM today.   Objective:   Vitals:   07/13/23 1216  07/13/23 2020 07/14/23 0336 07/14/23 1323  BP: (!) 144/89 (!) 149/89 (!) 118/57 (!) 139/92  Pulse: 66 81 65 73  Resp: 16 17 16    Temp: 98.2 F (36.8 C) 98.8 F (37.1 C) 98.2 F (36.8 C) 98.2 F (36.8 C)  TempSrc:  Oral  Oral  SpO2: 98% 100% 98% 99%  Weight:      Height:       No intake or output data in the 24 hours ending 07/14/23 1614  Filed Weights   07/12/23 0832  Weight: 90.7 kg     Exam General exam: Appears calm and comfortable  Respiratory system: Clear to auscultation. Respiratory effort normal. Cardiovascular system: S1 & S2 heard, RRR. No JVD, Gastrointestinal system: Abdomen is nondistended, soft and nontender.  Central nervous system: Alert and oriented.  Extremities: Symmetric 5 x 5 power. Skin: No rashes,  Psychiatry:  Mood & affect appropriate.    Data Reviewed:  I have personally reviewed following labs and imaging studies   CBC Lab Results  Component Value Date   WBC 3.5 (L) 07/14/2023   RBC 3.64 (L) 07/14/2023   HGB 11.1 (L) 07/14/2023   HCT 33.0 (L) 07/14/2023   MCV 90.7 07/14/2023   MCH 30.5 07/14/2023   PLT 201 07/14/2023   MCHC 33.6 07/14/2023   RDW 14.4 07/14/2023   LYMPHSABS 1.1 07/13/2023   MONOABS 0.6 07/13/2023   EOSABS 0.1 07/13/2023   BASOSABS 0.0 07/13/2023     Last metabolic panel Lab Results  Component Value Date   NA 135 07/14/2023   K 3.7 07/14/2023   CL 100 07/14/2023   CO2 26 07/14/2023   BUN 17 07/14/2023   CREATININE 1.55 (H) 07/14/2023   GLUCOSE 100 (H) 07/14/2023   GFRNONAA >60 07/14/2023   GFRAA >60 11/01/2019   CALCIUM 8.7 (L) 07/14/2023   PROT 6.8 07/14/2023   ALBUMIN 3.4 (L) 07/14/2023   LABGLOB 2.6 10/29/2022   AGRATIO 1.8 10/29/2022   BILITOT 0.4 07/14/2023   ALKPHOS 77 07/14/2023   AST 19 07/14/2023   ALT 19 07/14/2023   ANIONGAP 9 07/14/2023    CBG (last 3)  No results for input(s): "GLUCAP" in the last 72 hours.    Coagulation Profile: No results for input(s): "INR", "PROTIME" in the  last 168 hours.   Radiology Studies: MR ABDOMEN W WO CONTRAST Result Date: 07/14/2023 CLINICAL DATA:  Testicular cancer, right gonadal thrombus versus tumor thrombus versus lymphadenopathy EXAM: MRI ABDOMEN AND PELVIS WITHOUT AND WITH CONTRAST TECHNIQUE: Multiplanar multisequence MR imaging of the abdomen and pelvis was performed both before and after the administration of intravenous contrast. CONTRAST:  9mL GADAVIST GADOBUTROL 1 MMOL/ML IV SOLN COMPARISON:  CT abdomen pelvis, 07/12/2023 CT chest, 05/09/2019 FINDINGS: COMBINED FINDINGS FOR BOTH MR ABDOMEN AND PELVIS Lower chest:  No acute abnormality. Hepatobiliary: No solid liver abnormality is seen. No gallstones, gallbladder wall thickening, or biliary dilatation. Pancreas: Unremarkable. No pancreatic ductal dilatation or surrounding inflammatory changes. Spleen: Normal in size without significant abnormality. Adrenals/Urinary Tract: Adrenal glands are unremarkable. Moderate right hydronephrosis and proximal hydroureter, the right ureter obstructed proximally left kidney is normal, without obvious renal calculi, solid lesion, or hydronephrosis. Bladder is unremarkable. Stomach/Bowel: Stomach is within normal limits. Appendix appears normal. No evidence of bowel wall thickening, distention, or inflammatory changes. 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 There is however matted appearing right-sided retroperitoneal lymphadenopathy, as seen by prior examination of the chest dated 05/09/2023. Most superior nodes included on prior examination of the chest are diminished in size, index node measuring 1.3 x 0.8 cm, previously 2.5 x 2.1 cm (series 30, image 63). Ill-defined, matted soft tissue in the right paracolic gutter and about the cecal base (series 36, image 23). Reproductive:  Status post bilateral orchiectomy with testicular prosthesis. Normal prostate and penis. Other: No abdominal wall hernia or abnormality. No ascites. Musculoskeletal: No acute or significant osseous findings. IMPRESSION: 1. 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 2. There is however matted appearing right-sided retroperitoneal lymphadenopathy, as partially imaged by prior examination of the chest dated 05/09/2023. Most superior aortocaval nodes included on prior examination of the chest are diminished in size. Findings are consistent with nodal metastatic disease and interval treatment response. Any imaging of the abdomen performed in the interval from chest CT 05/09/2023 would be helpful for comparison. 3. Ill-defined, matted soft tissue in the right paracolic gutter and about the cecal base, consistent with treated metastatic disease. 4. Moderate right hydronephrosis and proximal hydroureter, the right ureter obstructed proximally by the above described retroperitoneal lymphadenopathy, and similar to prior examination of the chest dated 05/09/2023. 5. No other evidence of metastatic disease in the abdomen or pelvis. 6. Status post bilateral orchiectomy with testicular prosthesis. Electronically Signed   By: Jearld Lesch M.D.   On: 07/14/2023 14:46   MR PELVIS W WO CONTRAST Result Date: 07/14/2023 CLINICAL DATA:  Testicular cancer, right gonadal thrombus versus tumor thrombus versus lymphadenopathy EXAM: MRI ABDOMEN AND PELVIS WITHOUT AND WITH CONTRAST TECHNIQUE: Multiplanar multisequence MR imaging of the abdomen and pelvis was performed both before and after the administration of intravenous contrast. CONTRAST:  9mL GADAVIST GADOBUTROL 1 MMOL/ML IV SOLN COMPARISON:  CT abdomen pelvis, 07/12/2023 CT chest, 05/09/2019 FINDINGS: COMBINED FINDINGS FOR BOTH MR ABDOMEN AND PELVIS Lower chest:  No acute abnormality. Hepatobiliary: No solid liver abnormality is seen. No gallstones, gallbladder wall thickening, or biliary dilatation. Pancreas: Unremarkable. No pancreatic ductal dilatation or surrounding inflammatory changes. Spleen: Normal in size without significant abnormality. Adrenals/Urinary Tract: Adrenal glands are unremarkable. Moderate right hydronephrosis and proximal hydroureter, the right ureter obstructed proximally left kidney is normal, without obvious renal calculi, solid lesion, or hydronephrosis. Bladder is unremarkable. Stomach/Bowel: Stomach is within normal limits. Appendix appears normal. No evidence of bowel wall thickening, distention, or inflammatory changes. 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 There is however matted appearing right-sided retroperitoneal lymphadenopathy, as seen by prior examination of the chest dated 05/09/2023. Most superior nodes included  on prior examination of the chest are diminished in size, index node measuring 1.3 x 0.8 cm, previously 2.5 x 2.1 cm (series 30, image 63). Ill-defined, matted soft tissue in the right paracolic gutter and about the cecal base (series 36, image 23). Reproductive: Status post bilateral orchiectomy with testicular prosthesis. Normal prostate and penis. Other: No abdominal wall hernia or abnormality. No ascites. Musculoskeletal: No acute or significant osseous findings. IMPRESSION: 1. 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 2. There is however matted appearing right-sided retroperitoneal lymphadenopathy, as partially imaged by prior examination of the chest dated 05/09/2023. Most superior aortocaval nodes included on prior  examination of the chest are diminished in size. Findings are consistent with nodal metastatic disease and interval treatment response. Any imaging of the abdomen performed in the interval from chest CT 05/09/2023 would be helpful for comparison. 3. Ill-defined, matted soft tissue in the right paracolic gutter and about the cecal base, consistent with treated metastatic disease. 4. Moderate right hydronephrosis and proximal hydroureter, the right ureter obstructed proximally by the above described retroperitoneal lymphadenopathy, and similar to prior examination of the chest dated 05/09/2023. 5. No other evidence of metastatic disease in the abdomen or pelvis. 6. Status post bilateral orchiectomy with testicular prosthesis. Electronically Signed   By: Jearld Lesch M.D.   On: 07/14/2023 14:46       Kathlen Mody M.D. [name] 07/14/2023, 4:14 PM  Available via Epic secure chat 7am-7pm After 7 pm, please refer to night coverage provider listed on amion.

## 2023-07-14 NOTE — Progress Notes (Signed)
PHARMACY - ANTICOAGULATION CONSULT NOTE  Pharmacy Consult for heparin  Indication: Groin area tumoral thrombosis  Allergies  Allergen Reactions   Bee Venom Anaphylaxis   Other Swelling    Bee allergy    Patient Measurements: Height: 6\' 1"  (185.4 cm) Weight: 90.7 kg (200 lb) IBW/kg (Calculated) : 79.9 Heparin Dosing Weight: 90.7 kg  Vital Signs: Temp: 98.8 F (37.1 C) (01/27 2020) Temp Source: Oral (01/27 2020) BP: 149/89 (01/27 2020) Pulse Rate: 81 (01/27 2020)  Labs: Recent Labs    07/12/23 0846 07/12/23 1725 07/12/23 2356 07/13/23 0729 07/13/23 1200 07/13/23 1639 07/13/23 2253  HGB 10.9*   < > 10.7* 11.2* 11.2*  11.4*  --   --   HCT 32.7*   < > 32.8* 34.2* 34.7*  34.6*  --   --   PLT 175  --  187 197 216  --   --   HEPARINUNFRC  --    < > 0.17* 0.27*  --  0.59 0.38  CREATININE 0.97  --  1.09 1.04  --   --   --    < > = values in this interval not displayed.    Estimated Creatinine Clearance: 118.4 mL/min (by C-G formula based on SCr of 1.04 mg/dL).   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 Pharmacy consulted to dose heparin for R groin area tumoral thrombosis per Onc recs.   Pt CC: hematochezia this morning. Pt reports this morning when he went to the bathroom he saw BRBPR.  Hg 10.9 ( was 10.4 on 07/08/23, PLT 175 (was 124 07/07/22) , SCr 0.97  07/14/2023: Heparin level 0.38- therapeutic x 2  on IV heparin 1750 units/hr CBC:  Hg 11.4-low/stable, pltc WNL No bleeding or infusion concerns reported    Goal of Therapy:  Heparin level 0.3-0.7 units/ml Monitor platelets by anticoagulation protocol: Yes   Plan:  Continue heparin drip at 1750 units/hr Daily CBC & heparin level  Terrilee Files, PharmD 07/14/2023, 12:02 AM

## 2023-07-15 ENCOUNTER — Encounter: Payer: Self-pay | Admitting: Hematology & Oncology

## 2023-07-15 ENCOUNTER — Inpatient Hospital Stay: Payer: Managed Care, Other (non HMO)

## 2023-07-15 ENCOUNTER — Other Ambulatory Visit (HOSPITAL_COMMUNITY): Payer: Self-pay

## 2023-07-15 DIAGNOSIS — R1909 Other intra-abdominal and pelvic swelling, mass and lump: Secondary | ICD-10-CM | POA: Diagnosis not present

## 2023-07-15 LAB — CBC
HCT: 32.7 % — ABNORMAL LOW (ref 39.0–52.0)
Hemoglobin: 10.8 g/dL — ABNORMAL LOW (ref 13.0–17.0)
MCH: 29.8 pg (ref 26.0–34.0)
MCHC: 33 g/dL (ref 30.0–36.0)
MCV: 90.3 fL (ref 80.0–100.0)
Platelets: 222 10*3/uL (ref 150–400)
RBC: 3.62 MIL/uL — ABNORMAL LOW (ref 4.22–5.81)
RDW: 14.6 % (ref 11.5–15.5)
WBC: 4.7 10*3/uL (ref 4.0–10.5)
nRBC: 0 % (ref 0.0–0.2)

## 2023-07-15 LAB — COMPREHENSIVE METABOLIC PANEL
ALT: 24 U/L (ref 0–44)
AST: 25 U/L (ref 15–41)
Albumin: 3.3 g/dL — ABNORMAL LOW (ref 3.5–5.0)
Alkaline Phosphatase: 75 U/L (ref 38–126)
Anion gap: 9 (ref 5–15)
BUN: 16 mg/dL (ref 6–20)
CO2: 25 mmol/L (ref 22–32)
Calcium: 8.8 mg/dL — ABNORMAL LOW (ref 8.9–10.3)
Chloride: 106 mmol/L (ref 98–111)
Creatinine, Ser: 1.22 mg/dL (ref 0.61–1.24)
GFR, Estimated: >60 mL/min (ref >60)
Glucose, Bld: 110 mg/dL — ABNORMAL HIGH (ref 70–99)
Potassium: 3.7 mmol/L (ref 3.5–5.1)
Sodium: 140 mmol/L (ref 135–145)
Total Bilirubin: 0.5 mg/dL (ref 0.0–1.2)
Total Protein: 6.6 g/dL (ref 6.5–8.1)

## 2023-07-15 LAB — HEPARIN LEVEL (UNFRACTIONATED): Heparin Unfractionated: 0.37 [IU]/mL (ref 0.30–0.70)

## 2023-07-15 LAB — LACTATE DEHYDROGENASE: LDH: 179 U/L (ref 98–192)

## 2023-07-15 MED ORDER — APIXABAN 5 MG PO TABS
10.0000 mg | ORAL_TABLET | Freq: Two times a day (BID) | ORAL | Status: DC
  Administered 2023-07-15: 10 mg via ORAL
  Filled 2023-07-15: qty 2

## 2023-07-15 MED ORDER — APIXABAN 5 MG PO TABS: 5.0000 mg | ORAL_TABLET | Freq: Two times a day (BID) | ORAL | Status: DC

## 2023-07-15 MED ORDER — APIXABAN (ELIQUIS) VTE STARTER PACK (10MG AND 5MG)
ORAL_TABLET | ORAL | 0 refills | Status: DC
  Filled 2023-07-15: qty 74, 30d supply, fill #0

## 2023-07-15 MED ORDER — APIXABAN (ELIQUIS) VTE STARTER PACK (10MG AND 5MG): ORAL_TABLET | ORAL | 0 refills | Status: DC

## 2023-07-15 NOTE — Progress Notes (Signed)
Pt d/c with AVS. Pt had a visit from pharmacy staff regarding education on Eliquis. Pt had meds delivered to room. Pt left ambulating with wife. Daniel Shepherd S Bobak Oguinn

## 2023-07-15 NOTE — Progress Notes (Signed)
It looks like he is going to have a colonoscopy as an outpatient.  It looks like today, Urology may put a stent in the right kidney.  His renal function is better.  With hydration, the creatinine is down to 1.22.  As such, maybe, urology may not have to put a stent in..  The MRI is not surprising.  He is responding to treatment.  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  His beta-hCG is less than 1.  Again, he is responding to treatment.  I am not surprised by this at all.  Hopefully, he will be able to go home today.  If there is no urologic procedure, I would go ahead and put him on Eliquis.  I think this would be reasonable.  He has had no problems with pain.  He has had no rectal bleeding.  He is urinating okay.  He has had no cough or shortness of breath.  His white count is 4.7.  Hemoglobin 10.8.  Platelet count 222,000.  LDH is now 179.  His BUN is 16 creatinine 1.22.  Again, he is responding to treatment.  The LDH is come down quite nicely.  The beta-hCG is also come down quite nicely.  The MRI shows that he is responding.  Again, if Urology does not do any type of stent, I would go ahead and put him on Eliquis and let him go home.  I do not see a reason for him to be in the hospital right now.  I will still set him up for treatment on Monday for his recurrent testicular cancer.  I know that he has gotten great care from everybody upon [facility].    Christin Bach, MD  Ivin Booty 1:9

## 2023-07-15 NOTE — Consult Note (Signed)
PHARMACY - ANTICOAGULATION CONSULT NOTE  Pharmacy Consult for heparin to apixaban switch Indication: Groin area tumoral thrombosis   Allergies  Allergen Reactions   Bee Venom Anaphylaxis   Other Swelling    Bee allergy    Patient Measurements: Height: 6\' 1"  (185.4 cm) Weight: 90.7 kg (200 lb) IBW/kg (Calculated) : 79.9  Vital Signs: Temp: 98.5 F (36.9 C) (01/29 1159) Temp Source: Oral (01/29 1159) BP: 143/76 (01/29 1159) Pulse Rate: 71 (01/29 1159)  Labs: Recent Labs    07/13/23 0729 07/13/23 1200 07/13/23 1639 07/13/23 2253 07/14/23 0330 07/15/23 0350  HGB 11.2* 11.2*  11.4*  --   --  11.1* 10.8*  HCT 34.2* 34.7*  34.6*  --   --  33.0* 32.7*  PLT 197 216  --   --  201 222  HEPARINUNFRC 0.27*  --    < > 0.38 0.48 0.37  CREATININE 1.04  --   --   --  1.55* 1.22   < > = values in this interval not displayed.    Estimated Creatinine Clearance: 101 mL/min (by C-G formula based on SCr of 1.22 mg/dL).   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 Pharmacy consulted to dose heparin for R groin area tumoral thrombosis per Onc recs. Pharmacy consulted to dose apixaban prior to d/c this afternoon.  Pt CC: no hematochezia this morning. Hg 10.8 ( was 10.4 on 07/08/23, PLT 175 (was 124 07/07/22) , SCr 1.22  Goal of Therapy:  Prevent current thrombus from increasing in size Prevent future clots   Plan:  D/c heparin Start apixaban 10 mg BID x 7 days, 5 mg BID after, 3-6 months (pt  instructed to f/u with oncologist) Meds to beds- apixaban starter kit given to pt  Educated pt and wife on apixaban Monitor for signs of bleeding  Alva Garnet- PharmD Student 07/15/2023,1:18 PM

## 2023-07-15 NOTE — Plan of Care (Signed)

## 2023-07-15 NOTE — Discharge Instructions (Signed)
Information on my medicine - ELIQUIS (apixaban)  This medication education was reviewed with me or my healthcare representative as part of my discharge preparation.    Why was Eliquis prescribed for you? Eliquis was prescribed to treat blood clots that may have been found in the veins of your legs (deep vein thrombosis) or in your lungs (pulmonary embolism) and to reduce the risk of them occurring again.  What do You need to know about Eliquis ? The starting dose is 10 mg (two 5 mg tablets) taken TWICE daily for the FIRST SEVEN (7) DAYS, then on 07/22/2023 the dose is reduced to ONE 5 mg tablet taken TWICE daily.  Eliquis may be taken with or without food.   Try to take the dose about the same time in the morning and in the evening. If you have difficulty swallowing the tablet whole please discuss with your pharmacist how to take the medication safely.  Take Eliquis exactly as prescribed and DO NOT stop taking Eliquis without talking to the doctor who prescribed the medication.  Stopping may increase your risk of developing a new blood clot.  Refill your prescription before you run out.  After discharge, you should have regular check-up appointments with your healthcare provider that is prescribing your Eliquis.    What do you do if you miss a dose? If a dose of ELIQUIS is not taken at the scheduled time, take it as soon as possible on the same day and twice-daily administration should be resumed. The dose should not be doubled to make up for a missed dose.  Important Safety Information A possible side effect of Eliquis is bleeding. You should call your healthcare provider right away if you experience any of the following: Bleeding from an injury or your nose that does not stop. Unusual colored urine (red or dark brown) or unusual colored stools (red or black). Unusual bruising for unknown reasons. A serious fall or if you hit your head (even if there is no bleeding).  Some  medicines may interact with Eliquis and might increase your risk of bleeding or clotting while on Eliquis. To help avoid this, consult your healthcare provider or pharmacist prior to using any new prescription or non-prescription medications, including herbals, vitamins, non-steroidal anti-inflammatory drugs (NSAIDs) and supplements.  This website has more information on Eliquis (apixaban): http://www.eliquis.com/eliquis/home

## 2023-07-15 NOTE — Progress Notes (Signed)
Subjective: Daniel Shepherd. Pt was accompanied by his wife and resting in bed on rounds. No specific complaints aside from being thirsty.   Objective: Vital signs in last 24 hours: Temp:  [97.9 F (36.6 C)-98.6 F (37 C)] 97.9 F (36.6 C) (01/29 0409) Pulse Rate:  [64-83] 64 (01/29 0409) Resp:  [18] 18 (01/29 0409) BP: (139-145)/(81-92) 139/90 (01/29 0409) SpO2:  [99 %] 99 % (01/29 0409)  Assessment/Plan: #hydronephrosis #AKI- resolved  Renal function normalized overnight with IV rehydration. Hydronephrosis stable. OK to discharge from a urologic perspective.   Pt advised to monitor urine concentration and add fluids accordingly. Close ongoing follow up with Oncology. Repeat labs within one week.   Intake/Output from previous day: 01/28 0701 - 01/29 0700 In: 2526.9 [I.V.:2526.9] Out: -   Intake/Output this shift: No intake/output data recorded.  Physical Exam:  General: Alert and oriented CV: No cyanosis Lungs: equal chest rise   Lab Results: Recent Labs    07/13/23 1200 07/14/23 0330 07/15/23 0350  HGB 11.2*  11.4* 11.1* 10.8*  HCT 34.7*  34.6* 33.0* 32.7*   BMET Recent Labs    07/14/23 0330 07/15/23 0350  NA 135 140  K 3.7 3.7  CL 100 106  CO2 26 25  GLUCOSE 100* 110*  BUN 17 16  CREATININE 1.55* 1.22  CALCIUM 8.7* 8.8*     Studies/Results: MR ABDOMEN W WO CONTRAST Result Date: 07/14/2023 CLINICAL DATA:  Testicular cancer, right gonadal thrombus versus tumor thrombus versus lymphadenopathy EXAM: MRI ABDOMEN AND PELVIS WITHOUT AND WITH CONTRAST TECHNIQUE: Multiplanar multisequence MR imaging of the abdomen and pelvis was performed both before and after the administration of intravenous contrast. CONTRAST:  9mL GADAVIST GADOBUTROL 1 MMOL/ML IV SOLN COMPARISON:  CT abdomen pelvis, 07/12/2023 CT chest, 05/09/2019 FINDINGS: COMBINED FINDINGS FOR BOTH MR ABDOMEN AND PELVIS Lower chest: No acute abnormality. Hepatobiliary: No solid liver abnormality is seen.  No gallstones, gallbladder wall thickening, or biliary dilatation. Pancreas: Unremarkable. No pancreatic ductal dilatation or surrounding inflammatory changes. Spleen: Normal in size without significant abnormality. Adrenals/Urinary Tract: Adrenal glands are unremarkable. Moderate right hydronephrosis and proximal hydroureter, the right ureter obstructed proximally left kidney is normal, without obvious renal calculi, solid lesion, or hydronephrosis. Bladder is unremarkable. Stomach/Bowel: Stomach is within normal limits. Appendix appears normal. No evidence of bowel wall thickening, distention, or inflammatory changes. 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 There is however matted appearing right-sided retroperitoneal lymphadenopathy, as seen by prior examination of the chest dated 05/09/2023. Most superior nodes included on prior examination of the chest are diminished in size, index node measuring 1.3 x 0.8 cm, previously 2.5 x 2.1 cm (series 30, image 63). Ill-defined, matted soft tissue in the right paracolic gutter and about the cecal base (series 36, image 23). Reproductive: Status post bilateral orchiectomy with testicular prosthesis. Normal prostate and penis. Other: No abdominal wall hernia or abnormality. No ascites. Musculoskeletal: No acute or significant osseous findings. IMPRESSION: 1. 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 2. There is however matted appearing right-sided retroperitoneal lymphadenopathy, as partially imaged by prior examination of the chest dated 05/09/2023. Most superior aortocaval nodes included on prior examination of the chest are diminished in size. Findings are consistent  with nodal metastatic disease and interval treatment response. Any  imaging of the abdomen performed in the interval from chest CT 05/09/2023 would be helpful for comparison. 3. Ill-defined, matted soft tissue in the right paracolic gutter and about the cecal base, consistent with treated metastatic disease. 4. Moderate right hydronephrosis and proximal hydroureter, the right ureter obstructed proximally by the above described retroperitoneal lymphadenopathy, and similar to prior examination of the chest dated 05/09/2023. 5. No other evidence of metastatic disease in the abdomen or pelvis. 6. Status post bilateral orchiectomy with testicular prosthesis. Electronically Signed   By: Jearld Lesch M.D.   On: 07/14/2023 14:46   MR PELVIS W WO CONTRAST Result Date: 07/14/2023 CLINICAL DATA:  Testicular cancer, right gonadal thrombus versus tumor thrombus versus lymphadenopathy EXAM: MRI ABDOMEN AND PELVIS WITHOUT AND WITH CONTRAST TECHNIQUE: Multiplanar multisequence MR imaging of the abdomen and pelvis was performed both before and after the administration of intravenous contrast. CONTRAST:  9mL GADAVIST GADOBUTROL 1 MMOL/ML IV SOLN COMPARISON:  CT abdomen pelvis, 07/12/2023 CT chest, 05/09/2019 FINDINGS: COMBINED FINDINGS FOR BOTH MR ABDOMEN AND PELVIS Lower chest: No acute abnormality. Hepatobiliary: No solid liver abnormality is seen. No gallstones, gallbladder wall thickening, or biliary dilatation. Pancreas: Unremarkable. No pancreatic ductal dilatation or surrounding inflammatory changes. Spleen: Normal in size without significant abnormality. Adrenals/Urinary Tract: Adrenal glands are unremarkable. Moderate right hydronephrosis and proximal hydroureter, the right ureter obstructed proximally left kidney is normal, without obvious renal calculi, solid lesion, or hydronephrosis. Bladder is unremarkable. Stomach/Bowel: Stomach is within normal limits. Appendix appears normal. No evidence of bowel wall thickening,  distention, or inflammatory changes. 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 There is however matted appearing right-sided retroperitoneal lymphadenopathy, as seen by prior examination of the chest dated 05/09/2023. Most superior nodes included on prior examination of the chest are diminished in size, index node measuring 1.3 x 0.8 cm, previously 2.5 x 2.1 cm (series 30, image 63). Ill-defined, matted soft tissue in the right paracolic gutter and about the cecal base (series 36, image 23). Reproductive: Status post bilateral orchiectomy with testicular prosthesis. Normal prostate and penis. Other: No abdominal wall hernia or abnormality. No ascites. Musculoskeletal: No acute or significant osseous findings. IMPRESSION: 1. 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 2. There is however matted appearing right-sided retroperitoneal lymphadenopathy, as partially imaged by prior examination of the chest dated 05/09/2023. Most superior aortocaval nodes included on prior examination of the chest are diminished in size. Findings are consistent with nodal metastatic disease and interval treatment response. Any imaging of the abdomen performed in the interval from chest CT 05/09/2023 would be helpful for comparison. 3. Ill-defined, matted soft tissue in the right paracolic gutter and about the cecal base, consistent with treated metastatic disease. 4. Moderate right hydronephrosis and proximal hydroureter, the right ureter obstructed proximally by the above described retroperitoneal lymphadenopathy, and similar to prior examination of the chest dated 05/09/2023. 5. No other evidence of metastatic disease in the abdomen or  pelvis. 6. Status post bilateral orchiectomy with testicular prosthesis. Electronically Signed   By: Jearld Lesch M.D.   On: 07/14/2023 14:46      LOS: 2 days   Elmon Kirschner, NP [facility] Pager: [phone]  07/15/2023, 10:03 AM

## 2023-07-15 NOTE — Discharge Summary (Signed)
Physician Discharge Summary   Patient: Daniel Shepherd MRN: [id number] DOB: 29-Apr-1996  Admit date:     07/12/2023  Discharge date: 07/15/23  Discharge Physician: Tyrone Nine   PCP: Default, Provider, MD   Recommendations at discharge:  Follow up with urology in 1 week. Suggest recheck renal function parameters.  Follow up with oncology on Monday per Dr. Myna Hidalgo. Continue monitoring BMP and CBC w/differential.  Outpatient colonoscopy recommended by [facility], Dr. Lorenso Quarry.   Discharge Diagnoses: Principal Problem:   Right groin mass Active Problems:   Seminoma of right testis (HCC)   Neutropenia (HCC)   Normocytic anemia   Hyponatremia   Constipation   Rectal bleed   Thrombosis  Hospital Course: Daniel Shepherd is a 28 y.o. 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 Myna Hidalgo who presented to emergency department complaints of rectal bleeding and lower abdominal pain.  He was recently constipated, they had some loose stools after treating the constipation with bowel movements normalizing in the past couple days. Patient was admitted for evaluation of blood in stools. GI consulted, recommended outpatient colonoscopy.     CT abdomen/pelvis with contrast showing an elongated soft tissue mass along the course of the right colon along with left testicular pain, inseparable from the anterior surface of the right psoas, highly concerning for tumor thrombus of right gonadal vein/testicular vein versus metastatic lymphadenopathy.  There is resultant moderate right hydronephrosis and upper hydroureter.  Oncology on board and recommendations given.    He was started on IV heparin and admitted for further evaluation.   1. Long segment occlusion and expansile appearance of the right gonadal vein, without clear evidence of internal contrast enhancement to suggest tumor thrombus per  se. 2. There is however matted appearing right-sided retroperitoneal lymphadenopathy, as partially imaged by prior examination of the chest dated 05/09/2023. Most superior aortocaval nodes included on prior examination of the chest are diminished in size. Findings are consistent with nodal metastatic disease and interval treatment response. Any imaging of the abdomen performed in the interval from chest CT 05/09/2023 would be helpful for comparison. 3. Ill-defined, matted soft tissue in the right paracolic gutter and about the cecal base, consistent with treated metastatic disease. 4. Moderate right hydronephrosis and proximal hydroureter, the right ureter obstructed proximally by the above described retroperitoneal lymphadenopathy, and similar to prior examination of the chest dated 05/09/2023. 5. No other evidence of metastatic disease in the abdomen or pelvis. 6. Status post bilateral orchiectomy with testicular prosthesis.  With anticoagulation, no further bleeding was noted, pain improved significantly, and he was discharged after clearance by the oncology, urology, and medical teams on 1/29.  Assessment and Plan: Hematochezia: Painless, resolved with stable Hgb. Refused rectal exam. GI consulted, suspected constipation-related hemorrhoidal bleeding and signed off recommending outpatient colonoscopy.     Right gonadal vein thrombosis: - Convert IV heparin to eliquis. Ok to discharge per oncology and medical teams. No ongoing bleeding of any kind, VSS, hgb completely stable.    Testicular cancer: s/p orchiectomy and chemotherapy.  - MRI ordered and noted by oncology, Dr. Myna Hidalgo, with whom Daniel Shepherd will follow up.   Mild normocytic anemia/ anemia of blood loss: Stable with hgb in high 10's.    Neutropenia: Remains afebrile. Pt empirically started on levaquin 1/22 by Dr. Myna Hidalgo and has completed Tx.  - Recheck at follow up    Hypertension:  - Continue losartan.  Hyponatremia: Resolved.   Consultants: Oncology, urology Procedures performed: None  Disposition: Home Diet recommendation:  Cardiac diet DISCHARGE MEDICATION: Allergies as of 07/15/2023       Reactions   Bee Venom Anaphylaxis   Other Swelling   Bee allergy        Medication List     STOP taking these medications    ondansetron 8 MG tablet Commonly known as: Zofran       TAKE these medications    dexamethasone 4 MG tablet Commonly known as: DECADRON Take 2 tablets (8 mg total) by mouth daily. Start the day after last cisplatin dose on day 6 of chemo x 3 days.Take with food.   Eliquis DVT/PE Starter Pack Generic drug: Apixaban Starter Pack (10mg  and 5mg ) Take as directed on package: start with two-5mg  tablets twice daily for 7 days. On day 8, switch to one-5mg  tablet twice daily.   HYDROcodone-acetaminophen 5-325 MG tablet Commonly known as: Norco Take 1 tablet by mouth every 6 (six) hours as needed for moderate pain (pain score 4-6).   levofloxacin 500 MG tablet Commonly known as: LEVAQUIN Take 1 tablet (500 mg total) by mouth daily.   lidocaine-prilocaine cream Commonly known as: EMLA Apply to affected area once   losartan 25 MG tablet Commonly known as: COZAAR Take 12.5 mg by mouth daily.   multivitamin tablet Take 1 tablet by mouth daily.   prochlorperazine 10 MG tablet Commonly known as: COMPAZINE Take 1 tablet (10 mg total) by mouth every 6 (six) hours as needed for nausea or vomiting.   testosterone cypionate 200 MG/ML injection Commonly known as: DEPOTESTOSTERONE CYPIONATE Inject into the muscle every 7 (seven) days.        Follow-up Information     Lynann Bologna, DO. Call in 1 week(s).   Specialty: Gastroenterology Why: Call to schedule office visit to monitor symptoms of rectal bleeding and to schedule colonoscopy. Contact information: [address] [place] [address] [phone]         Josph Macho, MD Follow up.   Specialty: Oncology Contact information: [address] [place] [address] [phone]         [facility] Follow up.   Contact information: [address] [place] [address] [place] [address] [phone]               Discharge Exam: Ceasar Mons Weights   07/12/23 2440  Weight: 90.7 kg  BP (!) 143/76 (BP Location: Left Arm)   Pulse 71   Temp 98.5 F (36.9 C) (Oral)   Resp 18   Ht 6\' 1"  (1.854 m)   Wt 90.7 kg   SpO2 98%   BMI 26.39 kg/m   Well-appearing male in no acute distress Clear, nonlabored RRR, no MRG or edema Soft, NT, ND, +BS Alert, oriented, spouse at bedside  Condition at discharge: stable  The results of significant diagnostics from this hospitalization (including imaging, microbiology, ancillary and laboratory) are listed below for reference.   Imaging Studies: MR ABDOMEN W WO CONTRAST Result Date: 07/14/2023 CLINICAL DATA:  Testicular cancer, right gonadal thrombus versus tumor thrombus versus lymphadenopathy EXAM: MRI ABDOMEN AND PELVIS WITHOUT AND WITH CONTRAST TECHNIQUE: Multiplanar multisequence MR imaging of the abdomen and pelvis was performed both before and after the administration of intravenous contrast. CONTRAST:  9mL GADAVIST GADOBUTROL 1 MMOL/ML IV SOLN COMPARISON:  CT abdomen pelvis, 07/12/2023 CT chest, 05/09/2019 FINDINGS: COMBINED FINDINGS FOR BOTH MR  ABDOMEN AND PELVIS Lower chest: No acute abnormality. Hepatobiliary: No solid liver abnormality is seen. No gallstones, gallbladder wall thickening, or biliary dilatation. Pancreas: Unremarkable. No pancreatic ductal dilatation or surrounding inflammatory changes. Spleen: Normal in size without significant abnormality. Adrenals/Urinary Tract: Adrenal glands are unremarkable. Moderate right hydronephrosis and proximal hydroureter, the right ureter obstructed proximally left kidney is normal, without obvious renal calculi, solid lesion, or  hydronephrosis. Bladder is unremarkable. Stomach/Bowel: Stomach is within normal limits. Appendix appears normal. No evidence of bowel wall thickening, distention, or inflammatory changes. 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 There is however matted appearing right-sided retroperitoneal lymphadenopathy, as seen by prior examination of the chest dated 05/09/2023. Most superior nodes included on prior examination of the chest are diminished in size, index node measuring 1.3 x 0.8 cm, previously 2.5 x 2.1 cm (series 30, image 63). Ill-defined, matted soft tissue in the right paracolic gutter and about the cecal base (series 36, image 23). Reproductive: Status post bilateral orchiectomy with testicular prosthesis. Normal prostate and penis. Other: No abdominal wall hernia or abnormality. No ascites. Musculoskeletal: No acute or significant osseous findings. IMPRESSION: 1. 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 2. There is however matted appearing right-sided retroperitoneal lymphadenopathy, as partially imaged by prior examination of the chest dated 05/09/2023. Most superior aortocaval nodes included on prior examination of the chest are diminished in size. Findings are consistent with nodal metastatic disease and interval treatment response. Any imaging of the abdomen performed in the interval from chest CT 05/09/2023 would be helpful for comparison. 3. Ill-defined, matted soft tissue in the right paracolic gutter and about the cecal base, consistent with treated metastatic disease. 4. Moderate right hydronephrosis and proximal hydroureter, the right ureter obstructed proximally by the above described  retroperitoneal lymphadenopathy, and similar to prior examination of the chest dated 05/09/2023. 5. No other evidence of metastatic disease in the abdomen or pelvis. 6. Status post bilateral orchiectomy with testicular prosthesis. Electronically Signed   By: Jearld Lesch M.D.   On: 07/14/2023 14:46   MR PELVIS W WO CONTRAST Result Date: 07/14/2023 CLINICAL DATA:  Testicular cancer, right gonadal thrombus versus tumor thrombus versus lymphadenopathy EXAM: MRI ABDOMEN AND PELVIS WITHOUT AND WITH CONTRAST TECHNIQUE: Multiplanar multisequence MR imaging of the abdomen and pelvis was performed both before and after the administration of intravenous contrast. CONTRAST:  9mL GADAVIST GADOBUTROL 1 MMOL/ML IV SOLN COMPARISON:  CT abdomen pelvis, 07/12/2023 CT chest, 05/09/2019 FINDINGS: COMBINED FINDINGS FOR BOTH MR ABDOMEN AND PELVIS Lower chest: No acute abnormality. Hepatobiliary: No solid liver abnormality is seen. No gallstones, gallbladder wall thickening, or biliary dilatation. Pancreas: Unremarkable. No pancreatic ductal dilatation or surrounding inflammatory changes. Spleen: Normal in size without significant abnormality. Adrenals/Urinary Tract: Adrenal glands are unremarkable. Moderate right hydronephrosis and proximal hydroureter, the right ureter obstructed proximally left kidney is normal, without obvious renal calculi, solid lesion, or hydronephrosis. Bladder is unremarkable. Stomach/Bowel: Stomach is within normal limits. Appendix appears normal. No evidence of bowel wall thickening, distention, or inflammatory changes. 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 There is however matted appearing right-sided retroperitoneal lymphadenopathy, as seen by prior examination of the chest dated  05/09/2023. Most superior nodes included on  prior examination of the chest are diminished in size, index node measuring 1.3 x 0.8 cm, previously 2.5 x 2.1 cm (series 30, image 63). Ill-defined, matted soft tissue in the right paracolic gutter and about the cecal base (series 36, image 23). Reproductive: Status post bilateral orchiectomy with testicular prosthesis. Normal prostate and penis. Other: No abdominal wall hernia or abnormality. No ascites. Musculoskeletal: No acute or significant osseous findings. IMPRESSION: 1. 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 2. There is however matted appearing right-sided retroperitoneal lymphadenopathy, as partially imaged by prior examination of the chest dated 05/09/2023. Most superior aortocaval nodes included on prior examination of the chest are diminished in size. Findings are consistent with nodal metastatic disease and interval treatment response. Any imaging of the abdomen performed in the interval from chest CT 05/09/2023 would be helpful for comparison. 3. Ill-defined, matted soft tissue in the right paracolic gutter and about the cecal base, consistent with treated metastatic disease. 4. Moderate right hydronephrosis and proximal hydroureter, the right ureter obstructed proximally by the above described retroperitoneal lymphadenopathy, and similar to prior examination of the chest dated 05/09/2023. 5. No other evidence of metastatic disease in the abdomen or pelvis. 6. Status post bilateral orchiectomy with testicular prosthesis. Electronically Signed   By: Jearld Lesch M.D.   On: 07/14/2023 14:46   CT ABDOMEN PELVIS W CONTRAST Result Date: 07/12/2023 CLINICAL DATA:  Abdominal pain, acute, nonlocalized. History of testicular carcinoma. * Tracking Code: BO * EXAM: CT ABDOMEN AND PELVIS WITH CONTRAST TECHNIQUE: Multidetector CT imaging of the abdomen and pelvis was performed using the  standard protocol following bolus administration of intravenous contrast. RADIATION DOSE REDUCTION: This exam was performed according to the departmental dose-optimization program which includes automated exposure control, adjustment of the mA and/or kV according to patient size and/or use of iterative reconstruction technique. CONTRAST:  OMNIPAQUE IOHEXOL 300 MG/ML  SOLN COMPARISON:  CT scan abdomen and pelvis from 10/23/2022. FINDINGS: Lower chest: The lung bases are clear. No pleural effusion. The heart is normal in size. No pericardial effusion. Hepatobiliary: The liver is normal in size. Non-cirrhotic configuration. No suspicious mass. There is a 5 mm hypoattenuating focus in the left hepatic dome (series 301, image 12), which is too small to adequately characterize but appears unchanged since the prior study. No intrahepatic or extrahepatic bile duct dilation. No calcified gallstones. Normal gallbladder wall thickness. No pericholecystic inflammatory changes. Pancreas: Unremarkable. No pancreatic ductal dilatation or surrounding inflammatory changes. Spleen: Within normal limits. No focal lesion. Adrenals/Urinary Tract: Adrenal glands are unremarkable. No suspicious renal mass within the limitations of this unenhanced exam. There is new moderate right hydronephrosis secondary to an elongated soft tissue mass compressing the right upper/mid ureter, described in detail below. Right lower ureter is unremarkable. No left hydroureteronephrosis. No nephroureterolithiasis on either side. Unremarkable urinary bladder. Stomach/Bowel: No disproportionate dilation of the small or large bowel loops. No evidence of abnormal bowel wall thickening or inflammatory changes. The appendix was not visualized; however there is no acute inflammatory process in the right lower quadrant. Vascular/Lymphatic: No ascites or pneumoperitoneum. There is elongated soft tissue extending from the level of deep inguinal canal reaching up  to the inferior vena cava along the course of the right gonadal vein, inseparable from the anterior surface of the right psoas muscle, highly concerning for tumor thrombus of gonadal vein/metastatic lymphadenopathy  in this patient with known history of testicular carcinoma. No aneurysmal dilation of the major abdominal arteries. 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 Reproductive: There are postsurgical changes in the right inguinal region from prior orchiectomy. Partially seen testicular prosthesis. Right spermatic cord is also not seen and likely surgically absent. Correlate with surgical history. Other: There is a tiny fat containing umbilical hernia. The soft tissues and abdominal wall are otherwise unremarkable. Musculoskeletal: No suspicious osseous lesions. IMPRESSION: 1. There is elongated soft tissue mass along the course of the right gonadal/testicular vein, inseparable from the anterior surface of the right psoas muscle, highly concerning for tumor thrombus of right gonadal vein/testicular vein versus metastatic lymphadenopathy. 2. There is resultant moderate right hydronephrosis and upper hydroureter. 3. Otherwise no metastatic disease identified within the abdomen or pelvis. 4. Multiple other nonacute observations, as described above. Electronically Signed   By: Jules Schick M.D.   On: 07/12/2023 11:00   IR IMAGING GUIDED PORT INSERTION Result Date: 06/20/2023 INDICATION: 28 year old male referred for port catheter EXAM: IMAGE GUIDED PORT CATHETER MEDICATIONS: None ANESTHESIA/SEDATION: Moderate (conscious) sedation was employed during this procedure. A total of Versed 2.0 mg and Fentanyl 100 mcg was administered intravenously. Moderate Sedation Time: 21 minutes. The patient's level of consciousness and vital signs were monitored continuously by radiology nursing throughout the procedure under my direct supervision. FLUOROSCOPY TIME:  Fluoroscopy Time:  (1 mGy). COMPLICATIONS: None  PROCEDURE: Informed written consent was obtained from the patient after a discussion of the risks, benefits, and alternatives to treatment. Questions regarding the procedure were encouraged and answered. The right neck and chest were prepped with chlorhexidine in a sterile fashion, and a sterile drape was applied covering the operative field. Maximum barrier sterile technique with sterile gowns and gloves were used for the procedure. A timeout was performed prior to the initiation of the procedure. Ultrasound survey was performed with images stored and sent to PACs. Right IJ vein documented to be patent. The right neck and chest was prepped with chlorhexidine, and draped in the usual sterile fashion using maximum barrier technique (cap and mask, sterile gown, sterile gloves, large sterile sheet, hand hygiene and cutaneous antiseptic). Local anesthesia was attained by infiltration with 1% lidocaine without epinephrine. Ultrasound demonstrated patency of the right internal jugular vein, and this was documented with an image. Under real-time ultrasound guidance, this vein was accessed with a 21 gauge micropuncture needle and image documentation was performed. A small dermatotomy was made at the access site with an 11 scalpel. A 0.018" wire was advanced into the SVC and used to estimate the length of the internal catheter. The access needle exchanged for a 57F micropuncture vascular sheath. The 0.018" wire was then removed and a 0.035" wire advanced into the IVC. An appropriate location for the subcutaneous reservoir was selected below the clavicle and an incision was made through the skin and underlying soft tissues. The subcutaneous tissues were then dissected using a combination of blunt and sharp surgical technique and a pocket was formed. A single lumen power injectable portacatheter was then tunneled through the subcutaneous tissues from the pocket to the dermatotomy and the port reservoir placed within the  subcutaneous pocket. The venous access site was then serially dilated and a peel away vascular sheath placed over the wire. The wire was removed and the port catheter advanced into position under fluoroscopic guidance. The catheter tip is positioned in the cavoatrial junction. This was documented with a spot image. The portacatheter was  then tested and found to flush and aspirate well. The port was flushed with saline followed by 100 units/mL heparinized saline. The pocket was then closed in two layers using first subdermal inverted interrupted absorbable sutures followed by a running subcuticular suture. The epidermis was then sealed with Dermabond. The dermatotomy at the venous access site was also seal with Dermabond. Patient tolerated the procedure well and remained hemodynamically stable throughout. No complications encountered and no significant blood loss encountered IMPRESSION: Status post right IJ port catheter. Signed, Yvone Neu. Miachel Roux, RPVI [facility] Electronically Signed   By: Gilmer Mor D.O.   On: 06/20/2023 07:23    Microbiology: Results for orders placed or performed during the hospital encounter of 07/10/17  Culture, blood (routine x 2)     Status: None   Collection Time: 07/11/17 12:10 AM   Specimen: BLOOD RIGHT ARM  Result Value Ref Range Status   Specimen Description BLOOD RIGHT ARM  Final   Special Requests   Final    BOTTLES DRAWN AEROBIC AND ANAEROBIC Blood Culture adequate volume   Culture   Final    NO GROWTH 5 DAYS Performed at [facility], [address] [place], [place] [address]    Report Status 07/16/2017 FINAL  Final  Culture, blood (routine x 2)     Status: None   Collection Time: 07/11/17 12:25 AM   Specimen: BLOOD LEFT ARM  Result Value Ref Range Status   Specimen Description BLOOD LEFT ARM  Final   Special Requests   Final    BOTTLES DRAWN AEROBIC AND ANAEROBIC Blood Culture  adequate volume   Culture   Final    NO GROWTH 5 DAYS Performed at [facility], [address], [place], [place] [address]    Report Status 07/16/2017 FINAL  Final    Labs: CBC: Recent Labs  Lab 07/12/23 2356 07/13/23 0729 07/13/23 1200 07/14/23 0330 07/15/23 0350  WBC 2.8* 2.8* 3.2* 3.5* 4.7  NEUTROABS  --   --  1.1*  --   --   HGB 10.7* 11.2* 11.2*  11.4* 11.1* 10.8*  HCT 32.8* 34.2* 34.7*  34.6* 33.0* 32.7*  MCV 92.1 91.7 92.0 90.7 90.3  PLT 187 197 216 201 222   Basic Metabolic Panel: Recent Labs  Lab 07/12/23 0846 07/12/23 2356 07/13/23 0729 07/14/23 0330 07/15/23 0350  NA 134* 138 137 135 140  K 4.0 3.8 4.0 3.7 3.7  CL 101 103 102 100 106  CO2 26 26 26 26 25   GLUCOSE 101* 104* 102* 100* 110*  BUN 14 15 12 17 16   CREATININE 0.97 1.09 1.04 1.55* 1.22  CALCIUM 8.9 8.8* 9.1 8.7* 8.8*   Liver Function Tests: Recent Labs  Lab 07/12/23 0846 07/12/23 2356 07/13/23 0729 07/14/23 0330 07/15/23 0350  AST 18 16 17 19 25   ALT 20 18 19 19 24   ALKPHOS 77 75 73 77 75  BILITOT 0.5 0.6 0.4 0.4 0.5  PROT 6.8 6.8 7.0 6.8 6.6  ALBUMIN 3.7 3.6 3.6 3.4* 3.3*   CBG: No results for input(s): "GLUCAP" in the last 168 hours.  Discharge time spent: greater than 30 minutes.  Signed: Tyrone Nine, MD [facility] 07/15/2023

## 2023-07-16 ENCOUNTER — Inpatient Hospital Stay: Payer: Managed Care, Other (non HMO) | Admitting: Dietician

## 2023-07-16 ENCOUNTER — Inpatient Hospital Stay: Payer: Managed Care, Other (non HMO)

## 2023-07-16 NOTE — Progress Notes (Signed)
Nutrition Follow Up: Reached out to patient at his mobile#. Patient reports weight may be up.  He is trying to eat 3 full meals with protein bars fruit in between.  Recently hospitalized and treatments rescheduled.  He had AKI during hospitalization and is working to make sure he stays hydrated.  He has had some nausea but no vomiting is using to Compazine to help.  Nausea is usually occurring when he's at work and busy.  He's been drinking 4 bottles water and one cup coffee in morning and his goal is gallon of water.  Labs: 07/13/23 reviewed   Anthropometrics: last weight in EMR estimated by patient, he reports weight at 210 stable since last nutrition consult  Height: 74" Weight:  07/12/23 patient report this was estimated 06/19/23  210# DBW: 220# BMI: 26.96   Estimated Energy Needs  Kcals: 2900-3400 Protein: 115-144 g Fluid: 3 L   NUTRITION DIAGNOSIS: Inadequate PO intake to meet increased nutrient needs r/t cancer and treatments. Resolving, PO increased with more frequent feeds and attention to hydration.   INTERVENTION:   Provided hydration goal. Encouraged high protein foods as well as more frequent meals/snacks -  Suggested oral nutrition supplement at breakfast meal as this is very light and not enough protein. Discussed strategies management of nausea Encouraged him to reach out if unable to maintain usual PO and goal hydration.   MONITORING, EVALUATION, GOAL: weight trends, nutrition impact symptoms, PO intake, labs  Goal is increase in LBM and weight gain.  Next Visit: PRN at patient or provider request  Daniel Shepherd, RDN, LDN Registered Dietitian, [facility] Part Time Remote (Usual office hours: Tuesday-Thursday) Mobile: [phone]

## 2023-07-17 ENCOUNTER — Inpatient Hospital Stay: Payer: Managed Care, Other (non HMO)

## 2023-07-17 ENCOUNTER — Encounter: Payer: Self-pay | Admitting: *Deleted

## 2023-07-17 NOTE — Progress Notes (Signed)
Patient discharged. He will follow up in the office on Monday for consideration of next cycle of treatment.   Oncology Nurse Navigator Documentation     07/17/2023    8:30 AM  Oncology Nurse Navigator Flowsheets  Navigator Follow Up Date: 07/20/2023  Navigator Follow Up Reason: Follow-up Appointment;Chemotherapy  Navigator Location [facility]  Navigator Encounter Type Appt/Treatment Plan Review  Patient Visit Type MedOnc  Treatment Phase Active Tx  Barriers/Navigation Needs Coordination of Care  Interventions None Required  Acuity Level 2-Minimal Needs (1-2 Barriers Identified)  Support Groups/Services Friends and Family  Time Spent with Patient 15

## 2023-07-20 ENCOUNTER — Ambulatory Visit: Payer: Medicaid Other

## 2023-07-20 ENCOUNTER — Other Ambulatory Visit: Payer: Self-pay

## 2023-07-20 ENCOUNTER — Inpatient Hospital Stay: Payer: Managed Care, Other (non HMO) | Attending: Hematology & Oncology

## 2023-07-20 ENCOUNTER — Other Ambulatory Visit: Payer: Self-pay | Admitting: *Deleted

## 2023-07-20 ENCOUNTER — Inpatient Hospital Stay: Payer: Medicaid Other

## 2023-07-20 ENCOUNTER — Inpatient Hospital Stay: Payer: Managed Care, Other (non HMO)

## 2023-07-20 ENCOUNTER — Inpatient Hospital Stay (HOSPITAL_BASED_OUTPATIENT_CLINIC_OR_DEPARTMENT_OTHER): Payer: Medicaid Other | Admitting: Hematology & Oncology

## 2023-07-20 ENCOUNTER — Encounter: Payer: Self-pay | Admitting: Hematology & Oncology

## 2023-07-20 VITALS — BP 131/72 | HR 70 | Resp 18

## 2023-07-20 VITALS — Ht 73.0 in | Wt 207.0 lb

## 2023-07-20 DIAGNOSIS — D649 Anemia, unspecified: Secondary | ICD-10-CM

## 2023-07-20 DIAGNOSIS — C6292 Malignant neoplasm of left testis, unspecified whether descended or undescended: Secondary | ICD-10-CM

## 2023-07-20 DIAGNOSIS — I440 Atrioventricular block, first degree: Secondary | ICD-10-CM

## 2023-07-20 DIAGNOSIS — C6291 Malignant neoplasm of right testis, unspecified whether descended or undescended: Secondary | ICD-10-CM

## 2023-07-20 DIAGNOSIS — M25512 Pain in left shoulder: Secondary | ICD-10-CM

## 2023-07-20 DIAGNOSIS — Z5111 Encounter for antineoplastic chemotherapy: Secondary | ICD-10-CM | POA: Insufficient documentation

## 2023-07-20 DIAGNOSIS — K625 Hemorrhage of anus and rectum: Secondary | ICD-10-CM

## 2023-07-20 DIAGNOSIS — R1909 Other intra-abdominal and pelvic swelling, mass and lump: Secondary | ICD-10-CM

## 2023-07-20 DIAGNOSIS — Z1379 Encounter for other screening for genetic and chromosomal anomalies: Secondary | ICD-10-CM

## 2023-07-20 DIAGNOSIS — Z800 Family history of malignant neoplasm of digestive organs: Secondary | ICD-10-CM

## 2023-07-20 DIAGNOSIS — D709 Neutropenia, unspecified: Secondary | ICD-10-CM

## 2023-07-20 DIAGNOSIS — E871 Hypo-osmolality and hyponatremia: Secondary | ICD-10-CM

## 2023-07-20 DIAGNOSIS — C7802 Secondary malignant neoplasm of left lung: Secondary | ICD-10-CM

## 2023-07-20 DIAGNOSIS — Z5189 Encounter for other specified aftercare: Secondary | ICD-10-CM | POA: Diagnosis not present

## 2023-07-20 DIAGNOSIS — I8290 Acute embolism and thrombosis of unspecified vein: Secondary | ICD-10-CM

## 2023-07-20 DIAGNOSIS — S46912A Strain of unspecified muscle, fascia and tendon at shoulder and upper arm level, left arm, initial encounter: Secondary | ICD-10-CM

## 2023-07-20 DIAGNOSIS — Z803 Family history of malignant neoplasm of breast: Secondary | ICD-10-CM

## 2023-07-20 DIAGNOSIS — R0789 Other chest pain: Secondary | ICD-10-CM

## 2023-07-20 LAB — CBC WITH DIFFERENTIAL (CANCER CENTER ONLY)
Abs Immature Granulocytes: 0.22 10*3/uL — ABNORMAL HIGH (ref 0.00–0.07)
Basophils Absolute: 0.1 10*3/uL (ref 0.0–0.1)
Basophils Relative: 1 %
Eosinophils Absolute: 0.1 10*3/uL (ref 0.0–0.5)
Eosinophils Relative: 3 %
HCT: 35.4 % — ABNORMAL LOW (ref 39.0–52.0)
Hemoglobin: 12.1 g/dL — ABNORMAL LOW (ref 13.0–17.0)
Immature Granulocytes: 5 %
Lymphocytes Relative: 30 %
Lymphs Abs: 1.5 10*3/uL (ref 0.7–4.0)
MCH: 30.4 pg (ref 26.0–34.0)
MCHC: 34.2 g/dL (ref 30.0–36.0)
MCV: 88.9 fL (ref 80.0–100.0)
Monocytes Absolute: 0.5 10*3/uL (ref 0.1–1.0)
Monocytes Relative: 11 %
Neutro Abs: 2.5 10*3/uL (ref 1.7–7.7)
Neutrophils Relative %: 50 %
Platelet Count: 244 10*3/uL (ref 150–400)
RBC: 3.98 MIL/uL — ABNORMAL LOW (ref 4.22–5.81)
RDW: 15.1 % (ref 11.5–15.5)
WBC Count: 4.9 10*3/uL (ref 4.0–10.5)
nRBC: 0 % (ref 0.0–0.2)

## 2023-07-20 LAB — CMP (CANCER CENTER ONLY)
ALT: 84 U/L — ABNORMAL HIGH (ref 0–44)
AST: 35 U/L (ref 15–41)
Albumin: 4.4 g/dL (ref 3.5–5.0)
Alkaline Phosphatase: 76 U/L (ref 38–126)
Anion gap: 9 (ref 5–15)
BUN: 14 mg/dL (ref 6–20)
CO2: 28 mmol/L (ref 22–32)
Calcium: 9.5 mg/dL (ref 8.9–10.3)
Chloride: 101 mmol/L (ref 98–111)
Creatinine: 1.09 mg/dL (ref 0.61–1.24)
GFR, Estimated: >60 mL/min (ref >60)
Glucose, Bld: 73 mg/dL (ref 70–99)
Potassium: 3.9 mmol/L (ref 3.5–5.1)
Sodium: 138 mmol/L (ref 135–145)
Total Bilirubin: 0.4 mg/dL (ref 0.0–1.2)
Total Protein: 7.3 g/dL (ref 6.5–8.1)

## 2023-07-20 LAB — LACTATE DEHYDROGENASE: LDH: 195 U/L — ABNORMAL HIGH (ref 98–192)

## 2023-07-20 MED ORDER — MAGNESIUM SULFATE 2 GM/50ML IV SOLN
2.0000 g | Freq: Once | INTRAVENOUS | Status: AC
  Administered 2023-07-20: 2 g via INTRAVENOUS
  Filled 2023-07-20: qty 50

## 2023-07-20 MED ORDER — SODIUM CHLORIDE 0.9 % IV SOLN
INTRAVENOUS | Status: DC
Start: 2023-07-20 — End: 2023-07-20

## 2023-07-20 MED ORDER — SODIUM CHLORIDE 0.9 % IV SOLN
150.0000 mg | Freq: Once | INTRAVENOUS | Status: AC
  Administered 2023-07-20: 150 mg via INTRAVENOUS
  Filled 2023-07-20: qty 150

## 2023-07-20 MED ORDER — SODIUM CHLORIDE 0.9% FLUSH
10.0000 mL | INTRAVENOUS | Status: DC | PRN
  Administered 2023-07-20: 10 mL

## 2023-07-20 MED ORDER — POTASSIUM CHLORIDE IN NACL 20-0.9 MEQ/L-% IV SOLN
Freq: Once | INTRAVENOUS | Status: AC
  Filled 2023-07-20: qty 1000

## 2023-07-20 MED ORDER — SODIUM CHLORIDE 0.9 % IV SOLN
100.0000 mg/m2 | Freq: Once | INTRAVENOUS | Status: AC
  Administered 2023-07-20: 222 mg via INTRAVENOUS
  Filled 2023-07-20: qty 11.1

## 2023-07-20 MED ORDER — HEPARIN SOD (PORK) LOCK FLUSH 100 UNIT/ML IV SOLN
500.0000 [IU] | Freq: Once | INTRAVENOUS | Status: AC | PRN
  Administered 2023-07-20: 500 [IU]

## 2023-07-20 MED ORDER — DEXAMETHASONE SODIUM PHOSPHATE 10 MG/ML IJ SOLN
10.0000 mg | Freq: Once | INTRAMUSCULAR | Status: AC
  Administered 2023-07-20: 10 mg via INTRAVENOUS
  Filled 2023-07-20: qty 1

## 2023-07-20 MED ORDER — PALONOSETRON HCL INJECTION 0.25 MG/5ML
0.2500 mg | Freq: Once | INTRAVENOUS | Status: AC
  Administered 2023-07-20: 0.25 mg via INTRAVENOUS
  Filled 2023-07-20: qty 5

## 2023-07-20 MED ORDER — SODIUM CHLORIDE 0.9 % IV SOLN
20.0000 mg/m2 | Freq: Once | INTRAVENOUS | Status: AC
  Administered 2023-07-20: 44 mg via INTRAVENOUS
  Filled 2023-07-20: qty 44

## 2023-07-20 NOTE — Addendum Note (Signed)
Addended by: Neita Goodnight on: 07/20/2023 04:17 PM   Modules accepted: Orders

## 2023-07-20 NOTE — Progress Notes (Addendum)
Ok to begin hydration fluids without CMET results. Ok to treat with Mag level from 06/30/23. Will add Mag level to labs from today.  Anola Gurney [place], [place], BCPS, BCOP 07/20/2023 9:10 AM

## 2023-07-20 NOTE — Patient Instructions (Signed)

## 2023-07-20 NOTE — Patient Instructions (Signed)
Etoposide Capsules What is this medication? ETOPOSIDE (e toe POE side) treats lung cancer. It works by slowing down the growth of cancer cells. This medicine may be used for other purposes; ask your health care provider or pharmacist if you have questions. COMMON BRAND NAME(S): VePesid What should I tell my care team before I take this medication? They need to know if you have any of these conditions: Infection Kidney disease Liver disease Low blood counts, such as low white cell, platelet, red cell counts An unusual or allergic reaction to etoposide, other medications, foods, dyes, or preservatives Pregnant or trying to get pregnant Breastfeeding How should I use this medication? Take this medication by mouth with a glass of water. Take it as directed on the prescription label. Do not cut, crush, or chew this medication. Swallow the capsules whole. Do not take it more often than directed. Keep taking it unless your care team tells you to stop. Handling this medication may be harmful. Wear gloves while touching this medication or bottle. Talk to your care team about how to handle this medication. Special instructions may apply. Talk to your care team about the use of this medication in children. Special care may be needed. Overdosage: If you think you have taken too much of this medicine contact a poison control center or emergency room at once. NOTE: This medicine is only for you. Do not share this medicine with others. What if I miss a dose? If you miss a dose, take it as soon as you can. If it is almost time for your next dose, take only that dose. Do not take double or extra doses. What may interact with this medication? Cyclosporine Warfarin This list may not describe all possible interactions. Give your health care provider a list of all the medicines, herbs, non-prescription drugs, or dietary supplements you use. Also tell them if you smoke, drink alcohol, or use illegal drugs. Some  items may interact with your medicine. What should I watch for while using this medication? Your condition will be monitored carefully while you are receiving this medication. This medication may make you feel generally unwell. This is not uncommon as chemotherapy can affect healthy cells as well as cancer cells. Report any side effects. Continue your course of treatment even though you feel ill unless your care team tells you to stop. This medication may increase your risk of getting an infection. Call your care team for advice if you get a fever, chills, sore throat, or other symptoms of a cold or flu. Do not treat yourself. Try to avoid being around people who are sick. This medication may increase your risk to bruise or bleed. Call your care team if you notice any unusual bleeding. Talk to your care team about your risk of cancer. You may be more at risk for certain types of cancers if you take this medication. Talk to your care team if you may be pregnant. Serious birth defects can occur if you take this medication during pregnancy and for 6 months after the last dose. You will need a negative pregnancy test before starting this medication. Contraception is recommended while taking this medication and for 6 months after the last dose. Your care team can help you find the option that works for you. If your partner can get pregnant, use a condom during sex while taking this medication and for 4 months after the last dose. Do not breastfeed while taking this medication. This medication may cause infertility. Talk  to your care team if you are concerned about your fertility. What side effects may I notice from receiving this medication? Side effects that you should report to your care team as soon as possible: Allergic reactions--skin rash, itching, hives, swelling of the face, lips, tongue, or throat Infection--fever, chills, cough, sore throat, wounds that don't heal, pain or trouble when passing  urine, general feeling of discomfort or being unwell Low red blood cell level--unusual weakness or fatigue, dizziness, headache, trouble breathing Unusual bruising or bleeding Side effects that usually do not require medical attention (report to your care team if they continue or are bothersome): Diarrhea Fatigue Hair loss Loss of appetite Nausea Pain, redness, or swelling with sores inside the mouth or throat Vomiting This list may not describe all possible side effects. Call your doctor for medical advice about side effects. You may report side effects to FDA at 1-800-FDA-1088. Where should I keep my medication? Keep out of the reach of children and pets. Store in a refrigerator between 2 and 8 degrees C (36 and 46 degrees F). Do not freeze. Get rid any unused medication after the expiration date. To get rid of medications that are no longer needed or have expired: Take the medication to a medication take-back program. Check with your pharmacy or law enforcement to find a location. If you cannot return the medication, ask your pharmacist or care team how to get rid of this medication safely. NOTE: This sheet is a summary. It may not cover all possible information. If you have questions about this medicine, talk to your doctor, pharmacist, or health care provider.  2024 Elsevier/Gold Standard (2021-10-24 00:00:00) Cisplatin Injection What is this medication? CISPLATIN (SIS pla tin) treats some types of cancer. It works by slowing down the growth of cancer cells. This medicine may be used for other purposes; ask your health care provider or pharmacist if you have questions. COMMON BRAND NAME(S): Platinol, Platinol -AQ What should I tell my care team before I take this medication? They need to know if you have any of these conditions: Eye disease, vision problems Hearing problems Kidney disease Low blood counts, such as low white cells, platelets, or red blood cells Tingling of the  fingers or toes, or other nerve disorder An unusual or allergic reaction to cisplatin, carboplatin, oxaliplatin, other medications, foods, dyes, or preservatives If you or your partner are pregnant or trying to get pregnant Breast-feeding How should I use this medication? This medication is injected into a vein. It is given by your care team in a hospital or clinic setting. Talk to your care team about the use of this medication in children. Special care may be needed. Overdosage: If you think you have taken too much of this medicine contact a poison control center or emergency room at once. NOTE: This medicine is only for you. Do not share this medicine with others. What if I miss a dose? Keep appointments for follow-up doses. It is important not to miss your dose. Call your care team if you are unable to keep an appointment. What may interact with this medication? Do not take this medication with any of the following: Live virus vaccines This medication may also interact with the following: Certain antibiotics, such as amikacin, gentamicin, neomycin, polymyxin B, streptomycin, tobramycin, vancomycin Foscarnet This list may not describe all possible interactions. Give your health care provider a list of all the medicines, herbs, non-prescription drugs, or dietary supplements you use. Also tell them if you smoke,  drink alcohol, or use illegal drugs. Some items may interact with your medicine. What should I watch for while using this medication? Your condition will be monitored carefully while you are receiving this medication. You may need blood work done while taking this medication. This medication may make you feel generally unwell. This is not uncommon, as chemotherapy can affect healthy cells as well as cancer cells. Report any side effects. Continue your course of treatment even though you feel ill unless your care team tells you to stop. This medication may increase your risk of getting  an infection. Call your care team for advice if you get a fever, chills, sore throat, or other symptoms of a cold or flu. Do not treat yourself. Try to avoid being around people who are sick. Avoid taking medications that contain aspirin, acetaminophen, ibuprofen, naproxen, or ketoprofen unless instructed by your care team. These medications may hide a fever. This medication may increase your risk to bruise or bleed. Call your care team if you notice any unusual bleeding. Be careful brushing or flossing your teeth or using a toothpick because you may get an infection or bleed more easily. If you have any dental work done, tell your dentist you are receiving this medication. Drink fluids as directed while you are taking this medication. This will help protect your kidneys. Call your care team if you get diarrhea. Do not treat yourself. Talk to your care team if you or your partner wish to become pregnant or think you might be pregnant. This medication can cause serious birth defects if taken during pregnancy and for 14 months after the last dose. A negative pregnancy test is required before starting this medication. A reliable form of contraception is recommended while taking this medication and for 14 months after the last dose. Talk to your care team about effective forms of contraception. Do not father a child while taking this medication and for 11 months after the last dose. Use a condom during sex during this time period. Do not breast-feed while taking this medication. This medication may cause infertility. Talk to your care team if you are concerned about your fertility. What side effects may I notice from receiving this medication? Side effects that you should report to your care team as soon as possible: Allergic reactions--skin rash, itching, hives, swelling of the face, lips, tongue, or throat Eye pain, change in vision, vision loss Hearing loss, ringing in ears Infection--fever, chills,  cough, sore throat, wounds that don't heal, pain or trouble when passing urine, general feeling of discomfort or being unwell Kidney injury--decrease in the amount of urine, swelling of the ankles, hands, or feet Low red blood cell level--unusual weakness or fatigue, dizziness, headache, trouble breathing Painful swelling, warmth, or redness of the skin, blisters or sores at the infusion site Pain, tingling, or numbness in the hands or feet Unusual bruising or bleeding Side effects that usually do not require medical attention (report to your care team if they continue or are bothersome): Hair loss Nausea Vomiting This list may not describe all possible side effects. Call your doctor for medical advice about side effects. You may report side effects to FDA at 1-800-FDA-1088. Where should I keep my medication? This medication is given in a hospital or clinic. It will not be stored at home. NOTE: This sheet is a summary. It may not cover all possible information. If you have questions about this medicine, talk to your doctor, pharmacist, or health care provider.  2024  Elsevier/Gold Standard (2021-10-04 00:00:00)

## 2023-07-20 NOTE — Progress Notes (Signed)
Hematology and Oncology Follow Up Visit  Daniel Shepherd [id number] Jul 20, 1995 28 y.o. 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 twice daily -started on 07/13/2023     Interim History:  Daniel Shepherd is  back for follow-up.  He was in the hospital recently.  He has he came in with rectal bleeding.  This was bright red blood per rectum.  He was seen by Gastroenterology.  They are holding off on doing any type of study on him.  While in the hospital, he also was found to have a thrombus in the right gonadal vein.  This certainly could have been from where the psoas mass/lymph nodes were pressing on the vein.  He had scans done which actually showed improvement in his adenopathy.  His beta-hCG went from 27 down to less than 1.  I also need to mention the fact that he is on chronic testosterone injections.  This could also be a factor in the blood clot development.  He is on Eliquis right now.  He is doing well on Eliquis.  There is no problems with abdominal pain.  Again, I think that he is doing great with respect to the cancer.  The fact that the beta-hCG normalized is certainly encouraging.  I just think that once we get done with his 3 cycles of treatment, that he may need to have  RPLND.  I will have to talk to his Urologist about this.  He has had no cough.  There is no shortness of breath.  He has had no chest pain.   Currently, I would say his performance status is ECOG 0.     Medications:  Current Outpatient Medications:    APIXABAN (ELIQUIS) VTE STARTER PACK (10MG  AND 5MG ), Take as directed on package: start with two-5mg  tablets twice daily for 7  days. On day 8, switch to one-5mg  tablet twice daily., Disp: 74 each, Rfl: 0   dexamethasone (DECADRON) 4 MG tablet, Take 2 tablets (8 mg total) by mouth daily. Start the day after last cisplatin dose on day 6 of chemo x 3 days.Take with food., Disp: 30 tablet, Rfl: 1   HYDROcodone-acetaminophen (NORCO) 5-325 MG tablet, Take 1 tablet by mouth every 6 (six) hours as needed for moderate pain (pain score 4-6)., Disp: 60 tablet, Rfl: 0   levofloxacin (LEVAQUIN) 500 MG tablet, Take 1 tablet (500 mg total) by mouth daily., Disp: 30 tablet, Rfl: 1   lidocaine-prilocaine (EMLA) cream, Apply to affected area once, Disp: 30 g, Rfl: 3   losartan (COZAAR) 25 MG tablet, Take 12.5 mg by mouth daily., Disp: , Rfl:    Multiple Vitamin (MULTIVITAMIN) tablet, Take 1 tablet by mouth daily., Disp: , Rfl:    prochlorperazine (COMPAZINE) 10 MG tablet, Take 1 tablet (10 mg total) by mouth every 6 (six) hours as needed for nausea or vomiting., Disp: 30 tablet, Rfl: 1   testosterone cypionate (DEPOTESTOSTERONE CYPIONATE) 200 MG/ML injection, Inject into the muscle every 7 (seven) days., Disp: , Rfl:  No current facility-administered medications for this visit.  Facility-Administered Medications Ordered in Other Visits:    0.9 %  sodium chloride infusion, , Intravenous, Continuous, Tiphani Mells, Rose Phi,  MD, Stopped at 06/26/23 1112   0.9 %  sodium chloride infusion, , Intravenous, Continuous, Duante Arocho, Rose Phi, MD, Last Rate: 10 mL/hr at 07/20/23 0912, New Bag at 07/20/23 0912   0.9 % NaCl with KCl 20 mEq/ L  infusion, , Intravenous, Once, Santosha Jividen, Rose Phi, MD   magnesium sulfate IVPB 2 g 50 mL, 2 g, Intravenous, Once, Daun Rens, Rose Phi, MD   pegfilgrastim (NEULASTA ONPRO KIT) injection 6 mg, 6 mg, Subcutaneous, Once, Erenest Blank, NP   sodium chloride flush (NS) 0.9 % injection 10 mL, 10 mL, Intracatheter, PRN, Josph Macho, MD, 10 mL at 06/26/23 1352  Allergies:  Allergies  Allergen Reactions   Bee Venom Anaphylaxis    Other Swelling    Bee allergy    Past Medical History, Surgical history, Social history, and Family History were reviewed and updated.  Review of Systems: Review of Systems  Constitutional:  Positive for fatigue.  HENT:  Negative.    Eyes: Negative.   Respiratory:  Positive for shortness of breath.   Cardiovascular: Negative.   Gastrointestinal:  Positive for nausea.  Endocrine: Negative.   Genitourinary: Negative.    Musculoskeletal: Negative.   Skin: Negative.   Neurological: Negative.   Hematological: Negative.   Psychiatric/Behavioral: Negative.      Physical Exam:  height is 6\' 1"  (1.854 m) and weight is 207 lb (93.9 kg).   Wt Readings from Last 3 Encounters:  07/20/23 207 lb (93.9 kg)  07/20/23 207 lb (93.9 kg)  07/12/23 200 lb (90.7 kg)    Physical Exam Vitals reviewed.  HENT:     Head: Normocephalic and atraumatic.  Eyes:     Pupils: Pupils are equal, round, and reactive to light.  Cardiovascular:     Rate and Rhythm: Normal rate and regular rhythm.     Heart sounds: Normal heart sounds.  Pulmonary:     Effort: Pulmonary effort is normal.     Breath sounds: Normal breath sounds.  Abdominal:     General: Bowel sounds are normal.     Palpations: Abdomen is soft.     Comments: Has a well-healed left inguinal orchiectomy scar.  There is no swelling.  There is no erythema.  There is no tenderness.  Genitourinary:    Comments: Scrotal exam does show a localized area of swelling.  This is in the upper portion of the scrotum.  It is slightly tender.  It probably measures about 1 x 1 cm.  The rest of the scrotum is unremarkable.  He is status post left orchiectomy.  There is no inguinal adenopathy in the right inguinal chain. Musculoskeletal:        General: No tenderness or deformity. Normal range of motion.     Cervical back: Normal range of motion.  Lymphadenopathy:     Cervical: No cervical adenopathy.  Skin:    General: Skin is warm and dry.      Findings: No erythema or rash.  Neurological:     Mental Status: He is alert and oriented to person, place, and time.  Psychiatric:        Behavior: Behavior normal.        Thought Content: Thought content normal.        Judgment: Judgment normal.      Lab Results  Component Value Date   WBC 4.9 07/20/2023   HGB 12.1 (L) 07/20/2023   HCT 35.4 (L) 07/20/2023   MCV 88.9 07/20/2023   PLT 244 07/20/2023  Chemistry      Component Value Date/Time   NA 138 07/20/2023 0838   NA 141 10/29/2022 0931   K 3.9 07/20/2023 0838   CL 101 07/20/2023 0838   CO2 28 07/20/2023 0838   BUN 14 07/20/2023 0838   BUN 18 10/29/2022 0931   CREATININE 1.09 07/20/2023 0838      Component Value Date/Time   CALCIUM 9.5 07/20/2023 0838   ALKPHOS 76 07/20/2023 0838   AST 35 07/20/2023 0838   ALT 84 (H) 07/20/2023 0838   BILITOT 0.4 07/20/2023 0838      Impression and Plan: Mr. Karwowski is a 28 year old white male with metastatic embryonal cell carcinoma.  He had a left orchiectomy.  This was performed back on July 03, 2017.  A week later, he showed up in the emergency room with chest discomfort and some shortness of breath.  He had innumerable pulmonary nodules.  He was treated with systemic chemotherapy.  He had a complete response.  He then developed a second testicular cancer in the right testicle.  This was a pure seminoma.  It was early-stage.  Now, the seminoma has relapsed.  We will go ahead with the second cycle of treatment.  I will still plan for 3 cycles of chemotherapy and then we will have to see about the possibility of RPLND.  We will follow-up with his beta-hCG and his LDH.  He does have the right gonadal vein thrombus.  Unfortunately, this might require him to be on long-term anticoagulation since he is also taking supplemental testosterone.  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  Our goal clearly is to cure him.  We will plan to get him  back in another 3 weeks for his third cycle of treatment.  After the third cycle we will then plan for his scans.     Josph Macho, MD 2/3/20259:13 AM

## 2023-07-20 NOTE — Addendum Note (Signed)
Addended by: Neita Goodnight on: 07/20/2023 04:20 PM   Modules accepted: Orders

## 2023-07-21 ENCOUNTER — Other Ambulatory Visit: Payer: Self-pay

## 2023-07-21 ENCOUNTER — Inpatient Hospital Stay: Payer: Managed Care, Other (non HMO)

## 2023-07-21 ENCOUNTER — Encounter: Payer: Self-pay | Admitting: *Deleted

## 2023-07-21 VITALS — BP 158/79 | HR 82 | Temp 98.0°F | Resp 18

## 2023-07-21 DIAGNOSIS — Z5111 Encounter for antineoplastic chemotherapy: Secondary | ICD-10-CM | POA: Diagnosis not present

## 2023-07-21 DIAGNOSIS — C6291 Malignant neoplasm of right testis, unspecified whether descended or undescended: Secondary | ICD-10-CM

## 2023-07-21 LAB — BETA HCG QUANT (REF LAB): hCG Quant: <1 m[IU]/mL (ref 0–3)

## 2023-07-21 MED ORDER — HEPARIN SOD (PORK) LOCK FLUSH 100 UNIT/ML IV SOLN
500.0000 [IU] | Freq: Once | INTRAVENOUS | Status: AC | PRN
  Administered 2023-07-21: 500 [IU]

## 2023-07-21 MED ORDER — SODIUM CHLORIDE 0.9 % IV SOLN
INTRAVENOUS | Status: DC
Start: 2023-07-21 — End: 2023-07-21

## 2023-07-21 MED ORDER — SODIUM CHLORIDE 0.9 % IV SOLN
20.0000 mg/m2 | Freq: Once | INTRAVENOUS | Status: AC
  Administered 2023-07-21: 44 mg via INTRAVENOUS
  Filled 2023-07-21: qty 44

## 2023-07-21 MED ORDER — POTASSIUM CHLORIDE IN NACL 20-0.9 MEQ/L-% IV SOLN
Freq: Once | INTRAVENOUS | Status: AC
  Filled 2023-07-21: qty 1000

## 2023-07-21 MED ORDER — MAGNESIUM SULFATE 2 GM/50ML IV SOLN
2.0000 g | Freq: Once | INTRAVENOUS | Status: AC
  Administered 2023-07-21: 2 g via INTRAVENOUS
  Filled 2023-07-21: qty 50

## 2023-07-21 MED ORDER — SODIUM CHLORIDE 0.9 % IV SOLN
30.0000 [IU] | Freq: Once | INTRAVENOUS | Status: AC
  Administered 2023-07-21: 30 [IU] via INTRAVENOUS
  Filled 2023-07-21: qty 10

## 2023-07-21 MED ORDER — SODIUM CHLORIDE 0.9% FLUSH
10.0000 mL | INTRAVENOUS | Status: DC | PRN
  Administered 2023-07-21: 10 mL

## 2023-07-21 MED ORDER — DEXAMETHASONE SODIUM PHOSPHATE 10 MG/ML IJ SOLN
10.0000 mg | Freq: Once | INTRAMUSCULAR | Status: AC
  Administered 2023-07-21: 10 mg via INTRAVENOUS
  Filled 2023-07-21: qty 1

## 2023-07-21 MED ORDER — SODIUM CHLORIDE 0.9 % IV SOLN
100.0000 mg/m2 | Freq: Once | INTRAVENOUS | Status: AC
  Administered 2023-07-21: 222 mg via INTRAVENOUS
  Filled 2023-07-21: qty 11.1

## 2023-07-21 NOTE — Patient Instructions (Signed)
 [facility] - A DEPT OF [facility]  Discharge Instructions: Thank you for choosing [facility] to provide your oncology and hematology care.   If you have a lab appointment with the Cancer Center, please go directly to the Cancer Center and check in at the registration area.  Wear comfortable clothing and clothing appropriate for easy access to any Portacath or PICC line.   We strive to give you quality time with your provider. You may need to reschedule your appointment if you arrive late (15 or more minutes).  Arriving late affects you and other patients whose appointments are after yours.  Also, if you miss three or more appointments without notifying the office, you may be dismissed from the clinic at the provider's discretion.      For prescription refill requests, have your pharmacy contact our office and allow 72 hours for refills to be completed.    Today you received the following chemotherapy and/or immunotherapy agents Cisplatin, Etoposide      To help prevent nausea and vomiting after your treatment, we encourage you to take your nausea medication as directed.  BELOW ARE SYMPTOMS THAT SHOULD BE REPORTED IMMEDIATELY: *FEVER GREATER THAN 100.4 F (38 C) OR HIGHER *CHILLS OR SWEATING *NAUSEA AND VOMITING THAT IS NOT CONTROLLED WITH YOUR NAUSEA MEDICATION *UNUSUAL SHORTNESS OF BREATH *UNUSUAL BRUISING OR BLEEDING *URINARY PROBLEMS (pain or burning when urinating, or frequent urination) *BOWEL PROBLEMS (unusual diarrhea, constipation, pain near the anus) TENDERNESS IN MOUTH AND THROAT WITH OR WITHOUT PRESENCE OF ULCERS (sore throat, sores in mouth, or a toothache) UNUSUAL RASH, SWELLING OR PAIN  UNUSUAL VAGINAL DISCHARGE OR ITCHING   Items with * indicate a potential emergency and should be followed up as soon as possible or go to the Emergency Department if any problems should occur.  Please show the CHEMOTHERAPY ALERT CARD or  IMMUNOTHERAPY ALERT CARD at check-in to the Emergency Department and triage nurse. Should you have questions after your visit or need to cancel or reschedule your appointment, please contact [facility] - A DEPT OF [facility]  [phone] and follow the prompts.  Office hours are 8:00 a.m. to 4:30 p.m. Monday - Friday. Please note that voicemails left after 4:00 p.m. may not be returned until the following business day.  We are closed weekends and major holidays. You have access to a nurse at all times for urgent questions. Please call the main number to the clinic [phone] and follow the prompts.  For any non-urgent questions, you may also contact your provider using MyChart. We now offer e-Visits for anyone 31 and older to request care online for non-urgent symptoms. For details visit [email or web address].   Also download the MyChart app! Go to the app store, search "MyChart", open the app, select [place], and log in with your MyChart username and password.

## 2023-07-21 NOTE — Progress Notes (Signed)
 Patient proceeded with his second cycle of treatment. He is  planned for three cycles and then repeat scans. Possible retroperitoneal lymph node dissection; Dr Timmy will speak to his urologist.   Oncology Nurse Navigator Documentation     07/21/2023   10:45 AM  Oncology Nurse Navigator Flowsheets  Navigator Follow Up Date: 08/10/2023  Navigator Follow Up Reason: Follow-up Appointment;Chemotherapy  Navigator Location [facility]  Navigator Encounter Type Appt/Treatment Plan Review  Patient Visit Type MedOnc  Treatment Phase Active Tx  Barriers/Navigation Needs Coordination of Care  Interventions None Required  Acuity Level 2-Minimal Needs (1-2 Barriers Identified)  Support Groups/Services Friends and Family  Time Spent with Patient 15

## 2023-07-22 ENCOUNTER — Other Ambulatory Visit: Payer: Self-pay | Admitting: Oncology

## 2023-07-22 ENCOUNTER — Other Ambulatory Visit: Payer: Self-pay

## 2023-07-22 ENCOUNTER — Encounter: Payer: Self-pay | Admitting: Hematology & Oncology

## 2023-07-22 ENCOUNTER — Inpatient Hospital Stay: Payer: Managed Care, Other (non HMO)

## 2023-07-22 ENCOUNTER — Inpatient Hospital Stay: Payer: Managed Care, Other (non HMO) | Admitting: Licensed Clinical Social Worker

## 2023-07-22 VITALS — BP 128/76 | HR 74 | Temp 98.5°F | Resp 18

## 2023-07-22 DIAGNOSIS — Z5111 Encounter for antineoplastic chemotherapy: Secondary | ICD-10-CM | POA: Diagnosis not present

## 2023-07-22 DIAGNOSIS — C6291 Malignant neoplasm of right testis, unspecified whether descended or undescended: Secondary | ICD-10-CM

## 2023-07-22 MED ORDER — SODIUM CHLORIDE 0.9 % IV SOLN
20.0000 mg/m2 | Freq: Once | INTRAVENOUS | Status: AC
  Administered 2023-07-22: 44 mg via INTRAVENOUS
  Filled 2023-07-22: qty 44

## 2023-07-22 MED ORDER — PALONOSETRON HCL INJECTION 0.25 MG/5ML
0.2500 mg | Freq: Once | INTRAVENOUS | Status: AC
  Administered 2023-07-22: 0.25 mg via INTRAVENOUS
  Filled 2023-07-22: qty 5

## 2023-07-22 MED ORDER — SODIUM CHLORIDE 0.9 % IV SOLN
INTRAVENOUS | Status: DC
Start: 2023-07-22 — End: 2023-07-22

## 2023-07-22 MED ORDER — DEXAMETHASONE SODIUM PHOSPHATE 10 MG/ML IJ SOLN
10.0000 mg | Freq: Once | INTRAMUSCULAR | Status: AC
  Administered 2023-07-22: 10 mg via INTRAVENOUS
  Filled 2023-07-22: qty 1

## 2023-07-22 MED ORDER — SODIUM CHLORIDE 0.9% FLUSH
10.0000 mL | INTRAVENOUS | Status: DC | PRN
  Administered 2023-07-22: 10 mL

## 2023-07-22 MED ORDER — MAGNESIUM SULFATE 2 GM/50ML IV SOLN
2.0000 g | Freq: Once | INTRAVENOUS | Status: AC
  Administered 2023-07-22: 2 g via INTRAVENOUS
  Filled 2023-07-22: qty 50

## 2023-07-22 MED ORDER — SODIUM CHLORIDE 0.9 % IV SOLN
100.0000 mg/m2 | Freq: Once | INTRAVENOUS | Status: AC
  Administered 2023-07-22: 222 mg via INTRAVENOUS
  Filled 2023-07-22: qty 11.1

## 2023-07-22 MED ORDER — ONDANSETRON HCL 8 MG PO TABS: 8.0000 mg | ORAL_TABLET | Freq: Three times a day (TID) | ORAL | 3 refills | Status: DC | PRN

## 2023-07-22 MED ORDER — HEPARIN SOD (PORK) LOCK FLUSH 100 UNIT/ML IV SOLN
500.0000 [IU] | Freq: Once | INTRAVENOUS | Status: AC | PRN
  Administered 2023-07-22: 500 [IU]

## 2023-07-22 MED ORDER — POTASSIUM CHLORIDE IN NACL 20-0.9 MEQ/L-% IV SOLN
Freq: Once | INTRAVENOUS | Status: AC
  Filled 2023-07-22: qty 1000

## 2023-07-22 MED ORDER — SODIUM CHLORIDE 0.9 % IV SOLN
150.0000 mg | Freq: Once | INTRAVENOUS | Status: AC
  Administered 2023-07-22: 150 mg via INTRAVENOUS
  Filled 2023-07-22: qty 150

## 2023-07-22 NOTE — Progress Notes (Signed)
 OK to run IVF's with Cisplatin  per order of Dr. Maria Shiner.

## 2023-07-22 NOTE — Patient Instructions (Signed)
 [facility] - A DEPT OF [facility]  Discharge Instructions: Thank you for choosing [facility] to provide your oncology and hematology care.   If you have a lab appointment with the Cancer Center, please go directly to the Cancer Center and check in at the registration area.  Wear comfortable clothing and clothing appropriate for easy access to any Portacath or PICC line.   We strive to give you quality time with your provider. You may need to reschedule your appointment if you arrive late (15 or more minutes).  Arriving late affects you and other patients whose appointments are after yours.  Also, if you miss three or more appointments without notifying the office, you may be dismissed from the clinic at the provider's discretion.      For prescription refill requests, have your pharmacy contact our office and allow 72 hours for refills to be completed.    Today you received the following chemotherapy and/or immunotherapy agents Cisplatin/VP 16      To help prevent nausea and vomiting after your treatment, we encourage you to take your nausea medication as directed.  BELOW ARE SYMPTOMS THAT SHOULD BE REPORTED IMMEDIATELY: *FEVER GREATER THAN 100.4 F (38 C) OR HIGHER *CHILLS OR SWEATING *NAUSEA AND VOMITING THAT IS NOT CONTROLLED WITH YOUR NAUSEA MEDICATION *UNUSUAL SHORTNESS OF BREATH *UNUSUAL BRUISING OR BLEEDING *URINARY PROBLEMS (pain or burning when urinating, or frequent urination) *BOWEL PROBLEMS (unusual diarrhea, constipation, pain near the anus) TENDERNESS IN MOUTH AND THROAT WITH OR WITHOUT PRESENCE OF ULCERS (sore throat, sores in mouth, or a toothache) UNUSUAL RASH, SWELLING OR PAIN  UNUSUAL VAGINAL DISCHARGE OR ITCHING   Items with * indicate a potential emergency and should be followed up as soon as possible or go to the Emergency Department if any problems should occur.  Please show the CHEMOTHERAPY ALERT CARD or  IMMUNOTHERAPY ALERT CARD at check-in to the Emergency Department and triage nurse. Should you have questions after your visit or need to cancel or reschedule your appointment, please contact [facility] - A DEPT OF Eligha Bridegroom [facility]  [phone] and follow the prompts.  Office hours are 8:00 a.m. to 4:30 p.m. Monday - Friday. Please note that voicemails left after 4:00 p.m. may not be returned until the following business day.  We are closed weekends and major holidays. You have access to a nurse at all times for urgent questions. Please call the main number to the clinic [phone] and follow the prompts.  For any non-urgent questions, you may also contact your provider using MyChart. We now offer e-Visits for anyone 87 and older to request care online for non-urgent symptoms. For details visit [email or web address].   Also download the MyChart app! Go to the app store, search "MyChart", open the app, select [place], and log in with your MyChart username and password.

## 2023-07-22 NOTE — Progress Notes (Signed)
 [facility] CSW Progress Note  Visual Merchandiser met with patient in infusion to assess needs.  Discussed disability.  CSW to make referral to the [facility].  He feels he may be out of work for up to a year.  He confirmed he did receive the massage certificates.SABRA Macario CHRISTELLA Linton, LCSW Clinical Social Worker [facility]

## 2023-07-23 ENCOUNTER — Inpatient Hospital Stay: Payer: Managed Care, Other (non HMO)

## 2023-07-23 VITALS — BP 146/85 | HR 62 | Temp 97.8°F | Resp 18

## 2023-07-23 DIAGNOSIS — Z5111 Encounter for antineoplastic chemotherapy: Secondary | ICD-10-CM | POA: Diagnosis not present

## 2023-07-23 DIAGNOSIS — C6291 Malignant neoplasm of right testis, unspecified whether descended or undescended: Secondary | ICD-10-CM

## 2023-07-23 MED ORDER — MAGNESIUM SULFATE 2 GM/50ML IV SOLN
2.0000 g | Freq: Once | INTRAVENOUS | Status: AC
  Administered 2023-07-23: 2 g via INTRAVENOUS
  Filled 2023-07-23: qty 50

## 2023-07-23 MED ORDER — DEXAMETHASONE SODIUM PHOSPHATE 10 MG/ML IJ SOLN
10.0000 mg | Freq: Once | INTRAMUSCULAR | Status: AC
  Administered 2023-07-23: 10 mg via INTRAVENOUS
  Filled 2023-07-23: qty 1

## 2023-07-23 MED ORDER — SODIUM CHLORIDE 0.9 % IV SOLN
20.0000 mg/m2 | Freq: Once | INTRAVENOUS | Status: AC
  Administered 2023-07-23: 44 mg via INTRAVENOUS
  Filled 2023-07-23: qty 44

## 2023-07-23 MED ORDER — HOT PACK MISC ONCOLOGY: 1.0000 | Freq: Once | Status: DC | PRN

## 2023-07-23 MED ORDER — HEPARIN SOD (PORK) LOCK FLUSH 100 UNIT/ML IV SOLN
500.0000 [IU] | Freq: Once | INTRAVENOUS | Status: AC | PRN
Start: 2023-07-23 — End: 2023-07-23
  Administered 2023-07-23: 500 [IU]

## 2023-07-23 MED ORDER — SODIUM CHLORIDE 0.9 % IV SOLN: INTRAVENOUS | Status: DC

## 2023-07-23 MED ORDER — SODIUM CHLORIDE 0.9 % IV SOLN
100.0000 mg/m2 | Freq: Once | INTRAVENOUS | Status: AC
  Administered 2023-07-23: 222 mg via INTRAVENOUS
  Filled 2023-07-23: qty 11.1

## 2023-07-23 MED ORDER — POTASSIUM CHLORIDE IN NACL 20-0.9 MEQ/L-% IV SOLN
Freq: Once | INTRAVENOUS | Status: AC
  Filled 2023-07-23: qty 1000

## 2023-07-23 MED ORDER — SODIUM CHLORIDE 0.9% FLUSH
10.0000 mL | INTRAVENOUS | Status: DC | PRN
  Administered 2023-07-23: 10 mL

## 2023-07-23 NOTE — Patient Instructions (Addendum)
 [facility]  Discharge Instructions: Thank you for choosing [facility] to provide your oncology and hematology care.   If you have a lab appointment with the Cancer Center, please go directly to the Cancer Center and check in at the registration area.  Wear comfortable clothing and clothing appropriate for easy access to any Portacath or PICC line.   We strive to give you quality time with your provider. You may need to reschedule your appointment if you arrive late (15 or more minutes).  Arriving late affects you and other patients whose appointments are after yours.  Also, if you miss three or more appointments without notifying the office, you may be dismissed from the clinic at the provider's discretion.      For prescription refill requests, have your pharmacy contact our office and allow 72 hours for refills to be completed.    Today you received the following chemotherapy and/or immunotherapy agents Cisplatin, Etoposide      To help prevent nausea and vomiting after your treatment, we encourage you to take your nausea medication as directed.  BELOW ARE SYMPTOMS THAT SHOULD BE REPORTED IMMEDIATELY: *FEVER GREATER THAN 100.4 F (38 C) OR HIGHER *CHILLS OR SWEATING *NAUSEA AND VOMITING THAT IS NOT CONTROLLED WITH YOUR NAUSEA MEDICATION *UNUSUAL SHORTNESS OF BREATH *UNUSUAL BRUISING OR BLEEDING *URINARY PROBLEMS (pain or burning when urinating, or frequent urination) *BOWEL PROBLEMS (unusual diarrhea, constipation, pain near the anus) TENDERNESS IN MOUTH AND THROAT WITH OR WITHOUT PRESENCE OF ULCERS (sore throat, sores in mouth, or a toothache) UNUSUAL RASH, SWELLING OR PAIN  UNUSUAL VAGINAL DISCHARGE OR ITCHING   Items with * indicate a potential emergency and should be followed up as soon as possible or go to the Emergency Department if any problems should occur.  Please show the CHEMOTHERAPY ALERT CARD or  IMMUNOTHERAPY ALERT CARD at check-in to the Emergency Department and triage nurse. Should you have questions after your visit or need to cancel or reschedule your appointment, please contact [facility] - A DEPT OF [facility]  [phone] and follow the prompts.  Office hours are 8:00 a.m. to 4:30 p.m. Monday - Friday. Please note that voicemails left after 4:00 p.m. may not be returned until the following business day.  We are closed weekends and major holidays. You have access to a nurse at all times for urgent questions. Please call the main number to the clinic [phone] and follow the prompts.  For any non-urgent questions, you may also contact your provider using MyChart. We now offer e-Visits for anyone 31 and older to request care online for non-urgent symptoms. For details visit [email or web address].   Also download the MyChart app! Go to the app store, search "MyChart", open the app, select [place], and log in with your MyChart username and password.

## 2023-07-23 NOTE — Progress Notes (Signed)
 To run fluids concomitant with Cisplatin per Dr. Myna Hidalgo.

## 2023-07-24 ENCOUNTER — Inpatient Hospital Stay: Payer: Managed Care, Other (non HMO)

## 2023-07-24 VITALS — BP 154/86 | HR 76 | Temp 97.7°F | Resp 20

## 2023-07-24 DIAGNOSIS — Z5111 Encounter for antineoplastic chemotherapy: Secondary | ICD-10-CM | POA: Diagnosis not present

## 2023-07-24 DIAGNOSIS — C6291 Malignant neoplasm of right testis, unspecified whether descended or undescended: Secondary | ICD-10-CM

## 2023-07-24 MED ORDER — SODIUM CHLORIDE 0.9% FLUSH
10.0000 mL | INTRAVENOUS | Status: DC | PRN
  Administered 2023-07-24 (×2): 10 mL

## 2023-07-24 MED ORDER — SODIUM CHLORIDE 0.9 % IV SOLN: INTRAVENOUS | Status: DC

## 2023-07-24 MED ORDER — MAGNESIUM SULFATE 2 GM/50ML IV SOLN
2.0000 g | Freq: Once | INTRAVENOUS | Status: AC
  Administered 2023-07-24: 2 g via INTRAVENOUS
  Filled 2023-07-24: qty 50

## 2023-07-24 MED ORDER — POTASSIUM CHLORIDE IN NACL 20-0.9 MEQ/L-% IV SOLN
Freq: Once | INTRAVENOUS | Status: AC
  Filled 2023-07-24: qty 1000

## 2023-07-24 MED ORDER — SODIUM CHLORIDE 0.9 % IV SOLN
150.0000 mg | Freq: Once | INTRAVENOUS | Status: AC
  Administered 2023-07-24: 150 mg via INTRAVENOUS
  Filled 2023-07-24: qty 150

## 2023-07-24 MED ORDER — SODIUM CHLORIDE 0.9 % IV SOLN
100.0000 mg/m2 | Freq: Once | INTRAVENOUS | Status: AC
  Administered 2023-07-24: 222 mg via INTRAVENOUS
  Filled 2023-07-24: qty 11.1

## 2023-07-24 MED ORDER — SODIUM CHLORIDE 0.9 % IV SOLN
20.0000 mg/m2 | Freq: Once | INTRAVENOUS | Status: AC
  Administered 2023-07-24: 44 mg via INTRAVENOUS
  Filled 2023-07-24: qty 44

## 2023-07-24 MED ORDER — HEPARIN SOD (PORK) LOCK FLUSH 100 UNIT/ML IV SOLN
500.0000 [IU] | Freq: Once | INTRAVENOUS | Status: AC | PRN
  Administered 2023-07-24: 500 [IU]

## 2023-07-24 MED ORDER — PALONOSETRON HCL INJECTION 0.25 MG/5ML
0.2500 mg | Freq: Once | INTRAVENOUS | Status: AC
  Administered 2023-07-24: 0.25 mg via INTRAVENOUS
  Filled 2023-07-24: qty 5

## 2023-07-24 MED ORDER — DEXAMETHASONE SODIUM PHOSPHATE 10 MG/ML IJ SOLN
10.0000 mg | Freq: Once | INTRAMUSCULAR | Status: AC
  Administered 2023-07-24: 10 mg via INTRAVENOUS
  Filled 2023-07-24: qty 1

## 2023-07-24 NOTE — Patient Instructions (Signed)
 [facility] - A DEPT OF [facility]  Discharge Instructions: Thank you for choosing [facility] to provide your oncology and hematology care.   If you have a lab appointment with the Cancer Center, please go directly to the Cancer Center and check in at the registration area.  Wear comfortable clothing and clothing appropriate for easy access to any Portacath or PICC line.   We strive to give you quality time with your provider. You may need to reschedule your appointment if you arrive late (15 or more minutes).  Arriving late affects you and other patients whose appointments are after yours.  Also, if you miss three or more appointments without notifying the office, you may be dismissed from the clinic at the provider's discretion.      For prescription refill requests, have your pharmacy contact our office and allow 72 hours for refills to be completed.    Today you received the following chemotherapy and/or immunotherapy agents Cisplatin, Etoposide      To help prevent nausea and vomiting after your treatment, we encourage you to take your nausea medication as directed.  BELOW ARE SYMPTOMS THAT SHOULD BE REPORTED IMMEDIATELY: *FEVER GREATER THAN 100.4 F (38 C) OR HIGHER *CHILLS OR SWEATING *NAUSEA AND VOMITING THAT IS NOT CONTROLLED WITH YOUR NAUSEA MEDICATION *UNUSUAL SHORTNESS OF BREATH *UNUSUAL BRUISING OR BLEEDING *URINARY PROBLEMS (pain or burning when urinating, or frequent urination) *BOWEL PROBLEMS (unusual diarrhea, constipation, pain near the anus) TENDERNESS IN MOUTH AND THROAT WITH OR WITHOUT PRESENCE OF ULCERS (sore throat, sores in mouth, or a toothache) UNUSUAL RASH, SWELLING OR PAIN  UNUSUAL VAGINAL DISCHARGE OR ITCHING   Items with * indicate a potential emergency and should be followed up as soon as possible or go to the Emergency Department if any problems should occur.  Please show the CHEMOTHERAPY ALERT CARD or  IMMUNOTHERAPY ALERT CARD at check-in to the Emergency Department and triage nurse. Should you have questions after your visit or need to cancel or reschedule your appointment, please contact [facility] - A DEPT OF [facility]  [phone] and follow the prompts.  Office hours are 8:00 a.m. to 4:30 p.m. Monday - Friday. Please note that voicemails left after 4:00 p.m. may not be returned until the following business day.  We are closed weekends and major holidays. You have access to a nurse at all times for urgent questions. Please call the main number to the clinic [phone] and follow the prompts.  For any non-urgent questions, you may also contact your provider using MyChart. We now offer e-Visits for anyone 31 and older to request care online for non-urgent symptoms. For details visit [email or web address].   Also download the MyChart app! Go to the app store, search "MyChart", open the app, select [place], and log in with your MyChart username and password.

## 2023-07-24 NOTE — Progress Notes (Signed)
 Per Dr. Maria Shiner ok to run Cisplatin  with hydration fluids.

## 2023-07-27 ENCOUNTER — Inpatient Hospital Stay: Payer: Managed Care, Other (non HMO)

## 2023-07-27 VITALS — BP 150/73 | HR 68 | Temp 98.0°F | Resp 18

## 2023-07-27 DIAGNOSIS — Z5111 Encounter for antineoplastic chemotherapy: Secondary | ICD-10-CM | POA: Diagnosis not present

## 2023-07-27 DIAGNOSIS — C6291 Malignant neoplasm of right testis, unspecified whether descended or undescended: Secondary | ICD-10-CM

## 2023-07-27 MED ORDER — PEGFILGRASTIM-CBQV 6 MG/0.6ML ~~LOC~~ SOSY
6.0000 mg | PREFILLED_SYRINGE | Freq: Once | SUBCUTANEOUS | Status: AC
  Administered 2023-07-27: 6 mg via SUBCUTANEOUS
  Filled 2023-07-27: qty 0.6

## 2023-07-27 NOTE — Patient Instructions (Signed)

## 2023-07-27 NOTE — Addendum Note (Signed)
 Encounter addended by: Janice Lynwood BROCKS on: 07/27/2023 2:06 PM  Actions taken: Imaging Exam ended

## 2023-07-28 ENCOUNTER — Inpatient Hospital Stay: Payer: Managed Care, Other (non HMO)

## 2023-07-28 VITALS — BP 132/74 | HR 72 | Temp 98.1°F | Resp 17

## 2023-07-28 DIAGNOSIS — Z5111 Encounter for antineoplastic chemotherapy: Secondary | ICD-10-CM | POA: Diagnosis not present

## 2023-07-28 DIAGNOSIS — C6291 Malignant neoplasm of right testis, unspecified whether descended or undescended: Secondary | ICD-10-CM

## 2023-07-28 MED ORDER — SODIUM CHLORIDE 0.9 % IV SOLN
30.0000 [IU] | Freq: Once | INTRAVENOUS | Status: AC
  Administered 2023-07-28: 30 [IU] via INTRAVENOUS
  Filled 2023-07-28: qty 10

## 2023-07-28 MED ORDER — SODIUM CHLORIDE 0.9% FLUSH
10.0000 mL | INTRAVENOUS | Status: DC | PRN
  Administered 2023-07-28: 10 mL

## 2023-07-28 MED ORDER — PROCHLORPERAZINE MALEATE 10 MG PO TABS
10.0000 mg | ORAL_TABLET | Freq: Once | ORAL | Status: AC
  Administered 2023-07-28: 10 mg via ORAL
  Filled 2023-07-28: qty 1

## 2023-07-28 MED ORDER — SODIUM CHLORIDE 0.9 % IV SOLN: Freq: Once | INTRAVENOUS | Status: AC

## 2023-07-28 MED ORDER — SODIUM CHLORIDE 0.9 % IV SOLN: INTRAVENOUS | Status: DC

## 2023-07-28 MED ORDER — HEPARIN SOD (PORK) LOCK FLUSH 100 UNIT/ML IV SOLN
500.0000 [IU] | Freq: Once | INTRAVENOUS | Status: AC | PRN
  Administered 2023-07-28: 500 [IU]

## 2023-07-28 NOTE — Progress Notes (Signed)
Patient states his mouth feels dry and he was not able to keep up with his fluid intake through the weekend. He started drinking a little better yesterday. Clent Jacks, PA notified. Order given and carried out for 1 liter Normal Saline over 1.5 hours.

## 2023-08-04 ENCOUNTER — Inpatient Hospital Stay: Payer: Managed Care, Other (non HMO)

## 2023-08-04 ENCOUNTER — Ambulatory Visit: Payer: Managed Care, Other (non HMO) | Admitting: Hematology & Oncology

## 2023-08-04 VITALS — BP 130/75 | HR 93 | Temp 98.1°F | Resp 18

## 2023-08-04 DIAGNOSIS — C6291 Malignant neoplasm of right testis, unspecified whether descended or undescended: Secondary | ICD-10-CM

## 2023-08-04 DIAGNOSIS — Z5111 Encounter for antineoplastic chemotherapy: Secondary | ICD-10-CM | POA: Diagnosis not present

## 2023-08-04 DIAGNOSIS — C7802 Secondary malignant neoplasm of left lung: Secondary | ICD-10-CM

## 2023-08-04 LAB — CBC WITH DIFFERENTIAL (CANCER CENTER ONLY)
Abs Immature Granulocytes: 0 10*3/uL (ref 0.00–0.07)
Basophils Absolute: 0.1 10*3/uL (ref 0.0–0.1)
Basophils Relative: 1 %
Eosinophils Absolute: 1.1 10*3/uL — ABNORMAL HIGH (ref 0.0–0.5)
Eosinophils Relative: 11 %
HCT: 31.5 % — ABNORMAL LOW (ref 39.0–52.0)
Hemoglobin: 10.7 g/dL — ABNORMAL LOW (ref 13.0–17.0)
Lymphocytes Relative: 20 %
Lymphs Abs: 2.1 10*3/uL (ref 0.7–4.0)
MCH: 31.1 pg (ref 26.0–34.0)
MCHC: 34 g/dL (ref 30.0–36.0)
MCV: 91.6 fL (ref 80.0–100.0)
Monocytes Absolute: 1.2 10*3/uL — ABNORMAL HIGH (ref 0.1–1.0)
Monocytes Relative: 12 %
Neutro Abs: 5.8 10*3/uL (ref 1.7–7.7)
Neutrophils Relative %: 56 %
Platelet Count: 81 10*3/uL — ABNORMAL LOW (ref 150–400)
RBC: 3.44 MIL/uL — ABNORMAL LOW (ref 4.22–5.81)
RDW: 16.6 % — ABNORMAL HIGH (ref 11.5–15.5)
Smear Review: NORMAL
WBC Count: 10.4 10*3/uL (ref 4.0–10.5)
nRBC: 0.2 % (ref 0.0–0.2)

## 2023-08-04 LAB — CMP (CANCER CENTER ONLY)
ALT: 14 U/L (ref 0–44)
AST: 19 U/L (ref 15–41)
Albumin: 4 g/dL (ref 3.5–5.0)
Alkaline Phosphatase: 70 U/L (ref 38–126)
Anion gap: 6 (ref 5–15)
BUN: 14 mg/dL (ref 6–20)
CO2: 28 mmol/L (ref 22–32)
Calcium: 8.9 mg/dL (ref 8.9–10.3)
Chloride: 102 mmol/L (ref 98–111)
Creatinine: 0.89 mg/dL (ref 0.61–1.24)
GFR, Estimated: >60 mL/min (ref >60)
Glucose, Bld: 86 mg/dL (ref 70–99)
Potassium: 3.9 mmol/L (ref 3.5–5.1)
Sodium: 136 mmol/L (ref 135–145)
Total Bilirubin: 0.3 mg/dL (ref 0.0–1.2)
Total Protein: 6.4 g/dL — ABNORMAL LOW (ref 6.5–8.1)

## 2023-08-04 MED ORDER — PROCHLORPERAZINE MALEATE 10 MG PO TABS
10.0000 mg | ORAL_TABLET | Freq: Once | ORAL | Status: AC
  Administered 2023-08-04: 10 mg via ORAL
  Filled 2023-08-04: qty 1

## 2023-08-04 MED ORDER — HEPARIN SOD (PORK) LOCK FLUSH 100 UNIT/ML IV SOLN
500.0000 [IU] | Freq: Once | INTRAVENOUS | Status: AC | PRN
  Administered 2023-08-04: 500 [IU]

## 2023-08-04 MED ORDER — SODIUM CHLORIDE 0.9 % IV SOLN: Freq: Once | INTRAVENOUS | Status: AC

## 2023-08-04 MED ORDER — SODIUM CHLORIDE 0.9 % IV SOLN
30.0000 [IU] | Freq: Once | INTRAVENOUS | Status: AC
  Administered 2023-08-04: 30 [IU] via INTRAVENOUS
  Filled 2023-08-04: qty 10

## 2023-08-04 MED ORDER — SODIUM CHLORIDE 0.9% FLUSH
10.0000 mL | INTRAVENOUS | Status: DC | PRN
  Administered 2023-08-04: 10 mL

## 2023-08-04 NOTE — Patient Instructions (Signed)
 [facility]  Discharge Instructions: Thank you for choosing [facility] to provide your oncology and hematology care.   If you have a lab appointment with the Cancer Center, please go directly to the Cancer Center and check in at the registration area.  Wear comfortable clothing and clothing appropriate for easy access to any Portacath or PICC line.   We strive to give you quality time with your provider. You may need to reschedule your appointment if you arrive late (15 or more minutes).  Arriving late affects you and other patients whose appointments are after yours.  Also, if you miss three or more appointments without notifying the office, you may be dismissed from the clinic at the provider's discretion.      For prescription refill requests, have your pharmacy contact our office and allow 72 hours for refills to be completed.    Today you received the following chemotherapy and/or immunotherapy agents: Bleomycin      To help prevent nausea and vomiting after your treatment, we encourage you to take your nausea medication as directed.  BELOW ARE SYMPTOMS THAT SHOULD BE REPORTED IMMEDIATELY: *FEVER GREATER THAN 100.4 F (38 C) OR HIGHER *CHILLS OR SWEATING *NAUSEA AND VOMITING THAT IS NOT CONTROLLED WITH YOUR NAUSEA MEDICATION *UNUSUAL SHORTNESS OF BREATH *UNUSUAL BRUISING OR BLEEDING *URINARY PROBLEMS (pain or burning when urinating, or frequent urination) *BOWEL PROBLEMS (unusual diarrhea, constipation, pain near the anus) TENDERNESS IN MOUTH AND THROAT WITH OR WITHOUT PRESENCE OF ULCERS (sore throat, sores in mouth, or a toothache) UNUSUAL RASH, SWELLING OR PAIN  UNUSUAL VAGINAL DISCHARGE OR ITCHING   Items with * indicate a potential emergency and should be followed up as soon as possible or go to the Emergency Department if any problems should occur.  Please show the CHEMOTHERAPY ALERT CARD or IMMUNOTHERAPY  ALERT CARD at check-in to the Emergency Department and triage nurse. Should you have questions after your visit or need to cancel or reschedule your appointment, please contact [facility] - A DEPT OF [facility] Brideg[facility]  [phone] and follow the prompts.  Office hours are 8:00 a.m. to 4:30 p.m. Monday - Friday. Please note that voicemails left after 4:00 p.m. may not be returned until the following business day.  We are closed weekends and major holidays. You have access to a nurse at all times for urgent questions. Please call the main number to the clinic [phone] and follow the prompts.  For any non-urgent questions, you may also contact your provider using MyChart. We now offer e-Visits for anyone 43 and older to request care online for non-urgent symptoms. For details visit [email or web address].   Also download the MyChart app! Go to the app store, search "MyChart", open the app, select [place], and log in with your MyChart username and password.

## 2023-08-04 NOTE — Progress Notes (Signed)
Patient states he has not been able to drink well. Patient states his mouth is dry. He received fluids with treatment last week and felt better. Order given and carried out for 1 liter Normal Saline concurrently infusing with treatment per Dr. Myna Hidalgo.   Ok to treat with platelets 81,000 per Dr. Myna Hidalgo.

## 2023-08-10 ENCOUNTER — Encounter: Payer: Self-pay | Admitting: Hematology & Oncology

## 2023-08-10 ENCOUNTER — Inpatient Hospital Stay (HOSPITAL_BASED_OUTPATIENT_CLINIC_OR_DEPARTMENT_OTHER): Payer: Managed Care, Other (non HMO) | Admitting: Hematology & Oncology

## 2023-08-10 ENCOUNTER — Inpatient Hospital Stay: Payer: Managed Care, Other (non HMO)

## 2023-08-10 ENCOUNTER — Encounter: Payer: Self-pay | Admitting: *Deleted

## 2023-08-10 VITALS — BP 130/69 | HR 64 | Resp 20 | Ht 73.0 in | Wt 210.0 lb

## 2023-08-10 DIAGNOSIS — C6291 Malignant neoplasm of right testis, unspecified whether descended or undescended: Secondary | ICD-10-CM

## 2023-08-10 DIAGNOSIS — C6292 Malignant neoplasm of left testis, unspecified whether descended or undescended: Secondary | ICD-10-CM

## 2023-08-10 DIAGNOSIS — Z5111 Encounter for antineoplastic chemotherapy: Secondary | ICD-10-CM | POA: Diagnosis not present

## 2023-08-10 LAB — CBC WITH DIFFERENTIAL (CANCER CENTER ONLY)
Abs Immature Granulocytes: 0.88 10*3/uL — ABNORMAL HIGH (ref 0.00–0.07)
Basophils Absolute: 0.1 10*3/uL (ref 0.0–0.1)
Basophils Relative: 1 %
Eosinophils Absolute: 0 10*3/uL (ref 0.0–0.5)
Eosinophils Relative: 0 %
HCT: 33.4 % — ABNORMAL LOW (ref 39.0–52.0)
Hemoglobin: 11.3 g/dL — ABNORMAL LOW (ref 13.0–17.0)
Immature Granulocytes: 10 %
Lymphocytes Relative: 17 %
Lymphs Abs: 1.6 10*3/uL (ref 0.7–4.0)
MCH: 30.7 pg (ref 26.0–34.0)
MCHC: 33.8 g/dL (ref 30.0–36.0)
MCV: 90.8 fL (ref 80.0–100.0)
Monocytes Absolute: 0.7 10*3/uL (ref 0.1–1.0)
Monocytes Relative: 8 %
Neutro Abs: 6 10*3/uL (ref 1.7–7.7)
Neutrophils Relative %: 64 %
Platelet Count: 179 10*3/uL (ref 150–400)
RBC: 3.68 MIL/uL — ABNORMAL LOW (ref 4.22–5.81)
RDW: 16.5 % — ABNORMAL HIGH (ref 11.5–15.5)
Smear Review: NORMAL
WBC Count: 9.3 10*3/uL (ref 4.0–10.5)
nRBC: 0 % (ref 0.0–0.2)

## 2023-08-10 LAB — CMP (CANCER CENTER ONLY)
ALT: 14 U/L (ref 0–44)
AST: 15 U/L (ref 15–41)
Albumin: 4.3 g/dL (ref 3.5–5.0)
Alkaline Phosphatase: 67 U/L (ref 38–126)
Anion gap: 6 (ref 5–15)
BUN: 17 mg/dL (ref 6–20)
CO2: 29 mmol/L (ref 22–32)
Calcium: 9.5 mg/dL (ref 8.9–10.3)
Chloride: 104 mmol/L (ref 98–111)
Creatinine: 1.01 mg/dL (ref 0.61–1.24)
GFR, Estimated: >60 mL/min (ref >60)
Glucose, Bld: 83 mg/dL (ref 70–99)
Potassium: 4 mmol/L (ref 3.5–5.1)
Sodium: 139 mmol/L (ref 135–145)
Total Bilirubin: 0.3 mg/dL (ref 0.0–1.2)
Total Protein: 6.6 g/dL (ref 6.5–8.1)

## 2023-08-10 LAB — LACTATE DEHYDROGENASE: LDH: 254 U/L — ABNORMAL HIGH (ref 98–192)

## 2023-08-10 LAB — MAGNESIUM: Magnesium: 2 mg/dL (ref 1.7–2.4)

## 2023-08-10 MED ORDER — SODIUM CHLORIDE 0.9 % IV SOLN
20.0000 mg/m2 | Freq: Once | INTRAVENOUS | Status: AC
  Administered 2023-08-10: 44 mg via INTRAVENOUS
  Filled 2023-08-10: qty 44

## 2023-08-10 MED ORDER — DEXAMETHASONE SODIUM PHOSPHATE 10 MG/ML IJ SOLN
10.0000 mg | Freq: Once | INTRAMUSCULAR | Status: AC
Start: 2023-08-10 — End: 2023-08-10
  Administered 2023-08-10: 10 mg via INTRAVENOUS
  Filled 2023-08-10: qty 1

## 2023-08-10 MED ORDER — SODIUM CHLORIDE 0.9% FLUSH
10.0000 mL | INTRAVENOUS | Status: DC | PRN
  Administered 2023-08-10: 10 mL

## 2023-08-10 MED ORDER — MAGNESIUM SULFATE 2 GM/50ML IV SOLN
2.0000 g | Freq: Once | INTRAVENOUS | Status: AC
Start: 2023-08-10 — End: 2023-08-10
  Administered 2023-08-10: 2 g via INTRAVENOUS
  Filled 2023-08-10: qty 50

## 2023-08-10 MED ORDER — PALONOSETRON HCL INJECTION 0.25 MG/5ML
0.2500 mg | Freq: Once | INTRAVENOUS | Status: AC
  Administered 2023-08-10: 0.25 mg via INTRAVENOUS
  Filled 2023-08-10: qty 5

## 2023-08-10 MED ORDER — POTASSIUM CHLORIDE IN NACL 20-0.9 MEQ/L-% IV SOLN
Freq: Once | INTRAVENOUS | Status: AC
  Filled 2023-08-10: qty 1000

## 2023-08-10 MED ORDER — SODIUM CHLORIDE 0.9 % IV SOLN
150.0000 mg | Freq: Once | INTRAVENOUS | Status: AC
  Administered 2023-08-10: 150 mg via INTRAVENOUS
  Filled 2023-08-10: qty 150

## 2023-08-10 MED ORDER — SODIUM CHLORIDE 0.9 % IV SOLN: INTRAVENOUS | Status: DC

## 2023-08-10 MED ORDER — HEPARIN SOD (PORK) LOCK FLUSH 100 UNIT/ML IV SOLN
500.0000 [IU] | Freq: Once | INTRAVENOUS | Status: AC | PRN
  Administered 2023-08-10: 500 [IU]

## 2023-08-10 MED ORDER — ETOPOSIDE CHEMO INJECTION 1 GM/50ML
100.0000 mg/m2 | Freq: Once | INTRAVENOUS | Status: AC
  Administered 2023-08-10: 222 mg via INTRAVENOUS
  Filled 2023-08-10: qty 11.1

## 2023-08-10 NOTE — Patient Instructions (Signed)

## 2023-08-10 NOTE — Progress Notes (Signed)
 Patient will proceed with his final cycle of treatment. He will need post treatment pulmonary function tests and scans. Dr Myna Hidalgo will also reach out to his urologist about a possible RPLND.   Oncology Nurse Navigator Documentation     08/10/2023    8:45 AM  Oncology Nurse Navigator Flowsheets  Phase of Treatment Chemotherapy  Navigator Follow Up Date: 08/25/2023  Navigator Follow Up Reason: Chemotherapy  Navigator Location [facility]  Navigator Encounter Type Follow-up Appt  Patient Visit Type MedOnc  Treatment Phase Active Tx  Barriers/Navigation Needs No Barriers At This Time  Interventions None Required  Acuity Level 1-No Barriers  Support Groups/Services Friends and Family  Time Spent with Patient 15

## 2023-08-10 NOTE — Progress Notes (Signed)
 Hematology and Oncology Follow Up Visit  Daniel Shepherd [id number] 1996/02/23 28 y.o. 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 twice daily -started on 07/13/2023     Interim History:  Daniel Shepherd is  back for follow-up.  This is his third and final cycle of chemotherapy.   He is doing pretty well right now.  Thankfully, there is been no issues with respect to rectal bleeding.  Also not sure as why or how he had that.  This is a very self-limited.  His last beta-hCG was less than 1.  I think this is a very good indicator that he is responding well.  His last LDH was 195.  He has had no cough or shortness of breath.  He has had little bit of a fever after the bleomycin.  Exam no change in or bladder habits.  He has had no headache.  He does have a rash on his back.  Again I suspect this probably is from the bleomycin.  He has had no leg swelling.  He has had no weakness.  He does have a lot of fatigue which is surprising.  Overall, he is doing well on the Eliquis.  We will continue him on this for right now.  Overall, I would say that his performance status is ECOG 0.      Medications:  Current Outpatient Medications:    APIXABAN (ELIQUIS) VTE STARTER PACK (10MG  AND 5MG ), Take as directed on package: start with two-5mg  tablets twice daily for 7 days. On day 8, switch to one-5mg  tablet twice daily., Disp: 74 each, Rfl: 0   dexamethasone (DECADRON) 4 MG tablet, Take 2 tablets (8 mg total) by mouth daily. Start the day after last cisplatin dose on day 6 of chemo x 3 days.Take with food., Disp: 30 tablet, Rfl: 1   HYDROcodone-acetaminophen (NORCO)  5-325 MG tablet, Take 1 tablet by mouth every 6 (six) hours as needed for moderate pain (pain score 4-6)., Disp: 60 tablet, Rfl: 0   lidocaine-prilocaine (EMLA) cream, Apply to affected area once, Disp: 30 g, Rfl: 3   losartan (COZAAR) 25 MG tablet, Take 12.5 mg by mouth daily., Disp: , Rfl:    Multiple Vitamin (MULTIVITAMIN) tablet, Take 1 tablet by mouth daily., Disp: , Rfl:    ondansetron (ZOFRAN) 8 MG tablet, Take 1 tablet (8 mg total) by mouth every 8 (eight) hours as needed for nausea or vomiting., Disp: 20 tablet, Rfl: 3   prochlorperazine (COMPAZINE) 10 MG tablet, Take 1 tablet (10 mg total) by mouth every 6 (six) hours as needed for nausea or vomiting., Disp: 30 tablet, Rfl: 1   testosterone cypionate (DEPOTESTOSTERONE CYPIONATE) 200 MG/ML injection, Inject into the muscle every 7 (seven) days., Disp: , Rfl:  No current facility-administered medications for this visit.  Facility-Administered Medications Ordered in Other Visits:    0.9 %  sodium chloride infusion, , Intravenous, Continuous, Mckynna Vanloan, Rose Phi, MD, Stopped at 06/26/23 1112   pegfilgrastim (NEULASTA ONPRO KIT) injection 6 mg, 6 mg, Subcutaneous, Once, Erenest Blank, NP   sodium chloride flush (NS) 0.9 % injection 10 mL, 10 mL, Intracatheter, PRN, Josph Macho, MD, 10 mL at  06/26/23 1352  Allergies:  Allergies  Allergen Reactions   Bee Venom Anaphylaxis   Other Swelling    Bee allergy    Past Medical History, Surgical history, Social history, and Family History were reviewed and updated.  Review of Systems: Review of Systems  Constitutional:  Positive for fatigue.  HENT:  Negative.    Eyes: Negative.   Respiratory:  Positive for shortness of breath.   Cardiovascular: Negative.   Gastrointestinal:  Positive for nausea.  Endocrine: Negative.   Genitourinary: Negative.    Musculoskeletal: Negative.   Skin: Negative.   Neurological: Negative.   Hematological: Negative.   Psychiatric/Behavioral: Negative.       Physical Exam:  height is 6\' 1"  (1.854 m) and weight is 210 lb (95.3 kg). His blood pressure is 130/69 and his pulse is 64. His respiration is 20 and oxygen saturation is 100%.   Wt Readings from Last 3 Encounters:  08/10/23 210 lb (95.3 kg)  07/20/23 207 lb (93.9 kg)  07/20/23 207 lb (93.9 kg)    Physical Exam Vitals reviewed.  HENT:     Head: Normocephalic and atraumatic.  Eyes:     Pupils: Pupils are equal, round, and reactive to light.  Cardiovascular:     Rate and Rhythm: Normal rate and regular rhythm.     Heart sounds: Normal heart sounds.  Pulmonary:     Effort: Pulmonary effort is normal.     Breath sounds: Normal breath sounds.  Abdominal:     General: Bowel sounds are normal.     Palpations: Abdomen is soft.     Comments: Has a well-healed left inguinal orchiectomy scar.  There is no swelling.  There is no erythema.  There is no tenderness.  Genitourinary:    Comments: Scrotal exam does show a localized area of swelling.  This is in the upper portion of the scrotum.  It is slightly tender.  It probably measures about 1 x 1 cm.  The rest of the scrotum is unremarkable.  He is status post left orchiectomy.  There is no inguinal adenopathy in the right inguinal chain. Musculoskeletal:        General: No tenderness or deformity. Normal range of motion.     Cervical back: Normal range of motion.  Lymphadenopathy:     Cervical: No cervical adenopathy.  Skin:    General: Skin is warm and dry.     Findings: No erythema or rash.  Neurological:     Mental Status: He is alert and oriented to person, place, and time.  Psychiatric:        Behavior: Behavior normal.        Thought Content: Thought content normal.        Judgment: Judgment normal.     Lab Results  Component Value Date   WBC 9.3 08/10/2023   HGB 11.3 (L) 08/10/2023   HCT 33.4 (L) 08/10/2023   MCV 90.8 08/10/2023   PLT 179 08/10/2023     Chemistry      Component Value Date/Time   NA 136  08/04/2023 1041   NA 141 10/29/2022 0931   K 3.9 08/04/2023 1041   CL 102 08/04/2023 1041   CO2 28 08/04/2023 1041   BUN 14 08/04/2023 1041   BUN 18 10/29/2022 0931   CREATININE 0.89 08/04/2023 1041      Component Value Date/Time   CALCIUM 8.9 08/04/2023 1041   ALKPHOS 70 08/04/2023 1041   AST 19 08/04/2023 1041   ALT  14 08/04/2023 1041   BILITOT 0.3 08/04/2023 1041      Impression and Plan: Daniel Shepherd is a 28 year old white male with metastatic embryonal cell carcinoma.  He had a left orchiectomy.  This was performed back on July 03, 2017.  A week later, he showed up in the emergency room with chest discomfort and some shortness of breath.  He had innumerable pulmonary nodules.  He was treated with systemic chemotherapy.  He had a complete response.  He then developed a second testicular cancer in the right testicle.  This was a pure seminoma.  It was early-stage.  Now, the seminoma has relapsed.  This will be his third and final cycle of chemotherapy.  After this cycle, we will go ahead and repeat his scans.  I think through next question is whether or not he will need a RPLND.  This is certainly might be reasonable.  He is young.  He is in good health.  This clearly is a very unusual circumstance.  A RPLND is certainly eradicate any microscopic disease that he has.  I will have to speak with Urology about this.  Again we will have to set him up with CT scan.  He will also need to have pulmonary function test since he is getting the bleomycin.  I will plan to get back to see me in another month or so.   Josph Macho, MD 2/24/20259:16 AM

## 2023-08-11 ENCOUNTER — Other Ambulatory Visit: Payer: Self-pay | Admitting: *Deleted

## 2023-08-11 ENCOUNTER — Inpatient Hospital Stay: Payer: Managed Care, Other (non HMO)

## 2023-08-11 ENCOUNTER — Telehealth: Payer: Self-pay

## 2023-08-11 ENCOUNTER — Other Ambulatory Visit (HOSPITAL_BASED_OUTPATIENT_CLINIC_OR_DEPARTMENT_OTHER): Payer: Self-pay

## 2023-08-11 ENCOUNTER — Encounter: Payer: Self-pay | Admitting: Hematology & Oncology

## 2023-08-11 VITALS — BP 152/81 | HR 60 | Temp 98.2°F | Resp 20

## 2023-08-11 DIAGNOSIS — Z5111 Encounter for antineoplastic chemotherapy: Secondary | ICD-10-CM | POA: Diagnosis not present

## 2023-08-11 DIAGNOSIS — C6291 Malignant neoplasm of right testis, unspecified whether descended or undescended: Secondary | ICD-10-CM

## 2023-08-11 LAB — BETA HCG QUANT (REF LAB): hCG Quant: <1 m[IU]/mL (ref 0–3)

## 2023-08-11 MED ORDER — SODIUM CHLORIDE 0.9 % IV SOLN
30.0000 [IU] | Freq: Once | INTRAVENOUS | Status: AC
  Administered 2023-08-11: 30 [IU] via INTRAVENOUS
  Filled 2023-08-11: qty 10

## 2023-08-11 MED ORDER — OLANZAPINE 10 MG PO TABS
10.0000 mg | ORAL_TABLET | Freq: Every day | ORAL | 0 refills | Status: AC
  Filled 2023-08-11: qty 30, 30d supply, fill #0

## 2023-08-11 MED ORDER — HEPARIN SOD (PORK) LOCK FLUSH 100 UNIT/ML IV SOLN
500.0000 [IU] | Freq: Once | INTRAVENOUS | Status: AC | PRN
  Administered 2023-08-11: 500 [IU]

## 2023-08-11 MED ORDER — SODIUM CHLORIDE 0.9 % IV SOLN
100.0000 mg/m2 | Freq: Once | INTRAVENOUS | Status: AC
  Administered 2023-08-11: 222 mg via INTRAVENOUS
  Filled 2023-08-11: qty 11.1

## 2023-08-11 MED ORDER — SODIUM CHLORIDE 0.9 % IV SOLN
INTRAVENOUS | Status: DC
Start: 2023-08-11 — End: 2023-08-11

## 2023-08-11 MED ORDER — LORAZEPAM 2 MG/ML IJ SOLN
1.0000 mg | Freq: Once | INTRAMUSCULAR | Status: AC
  Administered 2023-08-11: 1 mg via INTRAVENOUS
  Filled 2023-08-11: qty 1

## 2023-08-11 MED ORDER — SODIUM CHLORIDE 0.9 % IV SOLN
20.0000 mg/m2 | Freq: Once | INTRAVENOUS | Status: AC
  Administered 2023-08-11: 44 mg via INTRAVENOUS
  Filled 2023-08-11: qty 44

## 2023-08-11 MED ORDER — PROCHLORPERAZINE MALEATE 10 MG PO TABS
10.0000 mg | ORAL_TABLET | Freq: Four times a day (QID) | ORAL | 1 refills | Status: DC | PRN
  Filled 2023-08-11: qty 30, 8d supply, fill #0

## 2023-08-11 MED ORDER — DEXAMETHASONE SODIUM PHOSPHATE 10 MG/ML IJ SOLN
10.0000 mg | Freq: Once | INTRAMUSCULAR | Status: AC
  Administered 2023-08-11: 10 mg via INTRAVENOUS
  Filled 2023-08-11: qty 1

## 2023-08-11 MED ORDER — MAGNESIUM SULFATE 2 GM/50ML IV SOLN
2.0000 g | Freq: Once | INTRAVENOUS | Status: AC
  Administered 2023-08-11: 2 g via INTRAVENOUS
  Filled 2023-08-11: qty 50

## 2023-08-11 MED ORDER — POTASSIUM CHLORIDE IN NACL 20-0.9 MEQ/L-% IV SOLN
Freq: Once | INTRAVENOUS | Status: AC
  Filled 2023-08-11: qty 1000

## 2023-08-11 MED ORDER — SODIUM CHLORIDE 0.9% FLUSH
10.0000 mL | INTRAVENOUS | Status: DC | PRN
  Administered 2023-08-11: 10 mL

## 2023-08-11 NOTE — Progress Notes (Signed)
 OK to run hydration fluids with Cisplatin per order of Dr. Myna Hidalgo.

## 2023-08-11 NOTE — Telephone Encounter (Signed)
 Notified Patient of prior authorization approval for Olanzapine 10mg  tablets. Medication is approved through 08/10/2024. Pharmacy notified. No other needs or concerns noted at this time.

## 2023-08-11 NOTE — Patient Instructions (Signed)
 [facility]  Discharge Instructions: Thank you for choosing [facility] to provide your oncology and hematology care.   If you have a lab appointment with the Cancer Center, please go directly to the Cancer Center and check in at the registration area.  Wear comfortable clothing and clothing appropriate for easy access to any Portacath or PICC line.   We strive to give you quality time with your provider. You may need to reschedule your appointment if you arrive late (15 or more minutes).  Arriving late affects you and other patients whose appointments are after yours.  Also, if you miss three or more appointments without notifying the office, you may be dismissed from the clinic at the provider's discretion.      For prescription refill requests, have your pharmacy contact our office and allow 72 hours for refills to be completed.    Today you received the following chemotherapy and/or immunotherapy agents Cisplatin, Etoposide, Bleomycin      To help prevent nausea and vomiting after your treatment, we encourage you to take your nausea medication as directed.  BELOW ARE SYMPTOMS THAT SHOULD BE REPORTED IMMEDIATELY: *FEVER GREATER THAN 100.4 F (38 C) OR HIGHER *CHILLS OR SWEATING *NAUSEA AND VOMITING THAT IS NOT CONTROLLED WITH YOUR NAUSEA MEDICATION *UNUSUAL SHORTNESS OF BREATH *UNUSUAL BRUISING OR BLEEDING *URINARY PROBLEMS (pain or burning when urinating, or frequent urination) *BOWEL PROBLEMS (unusual diarrhea, constipation, pain near the anus) TENDERNESS IN MOUTH AND THROAT WITH OR WITHOUT PRESENCE OF ULCERS (sore throat, sores in mouth, or a toothache) UNUSUAL RASH, SWELLING OR PAIN  UNUSUAL VAGINAL DISCHARGE OR ITCHING   Items with * indicate a potential emergency and should be followed up as soon as possible or go to the Emergency Department if any problems should occur.  Please show the CHEMOTHERAPY ALERT  CARD or IMMUNOTHERAPY ALERT CARD at check-in to the Emergency Department and triage nurse. Should you have questions after your visit or need to cancel or reschedule your appointment, please contact [facility] - A DEPT OF Eligha Bridegroom [facility]  [phone] and follow the prompts.  Office hours are 8:00 a.m. to 4:30 p.m. Monday - Friday. Please note that voicemails left after 4:00 p.m. may not be returned until the following business day.  We are closed weekends and major holidays. You have access to a nurse at all times for urgent questions. Please call the main number to the clinic [phone] and follow the prompts.  For any non-urgent questions, you may also contact your provider using MyChart. We now offer e-Visits for anyone 24 and older to request care online for non-urgent symptoms. For details visit [email or web address].   Also download the MyChart app! Go to the app store, search "MyChart", open the app, select [place], and log in with your MyChart username and password.

## 2023-08-12 ENCOUNTER — Inpatient Hospital Stay: Payer: Managed Care, Other (non HMO)

## 2023-08-12 VITALS — BP 143/89 | HR 67 | Temp 98.1°F | Resp 18

## 2023-08-12 DIAGNOSIS — Z5111 Encounter for antineoplastic chemotherapy: Secondary | ICD-10-CM | POA: Diagnosis not present

## 2023-08-12 DIAGNOSIS — C6291 Malignant neoplasm of right testis, unspecified whether descended or undescended: Secondary | ICD-10-CM

## 2023-08-12 MED ORDER — SODIUM CHLORIDE 0.9% FLUSH
10.0000 mL | INTRAVENOUS | Status: DC | PRN
  Administered 2023-08-12: 10 mL

## 2023-08-12 MED ORDER — PALONOSETRON HCL INJECTION 0.25 MG/5ML
0.2500 mg | Freq: Once | INTRAVENOUS | Status: AC
  Administered 2023-08-12: 0.25 mg via INTRAVENOUS
  Filled 2023-08-12: qty 5

## 2023-08-12 MED ORDER — DEXAMETHASONE SODIUM PHOSPHATE 10 MG/ML IJ SOLN
10.0000 mg | Freq: Once | INTRAMUSCULAR | Status: AC
  Administered 2023-08-12: 10 mg via INTRAVENOUS
  Filled 2023-08-12: qty 1

## 2023-08-12 MED ORDER — SODIUM CHLORIDE 0.9 % IV SOLN
100.0000 mg/m2 | Freq: Once | INTRAVENOUS | Status: AC
  Administered 2023-08-12: 222 mg via INTRAVENOUS
  Filled 2023-08-12: qty 11.1

## 2023-08-12 MED ORDER — SODIUM CHLORIDE 0.9 % IV SOLN: INTRAVENOUS | Status: DC

## 2023-08-12 MED ORDER — HEPARIN SOD (PORK) LOCK FLUSH 100 UNIT/ML IV SOLN
500.0000 [IU] | Freq: Once | INTRAVENOUS | Status: AC | PRN
  Administered 2023-08-12: 500 [IU]

## 2023-08-12 MED ORDER — POTASSIUM CHLORIDE IN NACL 20-0.9 MEQ/L-% IV SOLN
Freq: Once | INTRAVENOUS | Status: AC
  Filled 2023-08-12: qty 1000

## 2023-08-12 MED ORDER — CISPLATIN CHEMO INJECTION 100MG/100ML
20.0000 mg/m2 | Freq: Once | INTRAVENOUS | Status: AC
  Administered 2023-08-12: 44 mg via INTRAVENOUS
  Filled 2023-08-12: qty 44

## 2023-08-12 MED ORDER — SODIUM CHLORIDE 0.9 % IV SOLN
150.0000 mg | Freq: Once | INTRAVENOUS | Status: AC
  Administered 2023-08-12: 150 mg via INTRAVENOUS
  Filled 2023-08-12: qty 150

## 2023-08-12 MED ORDER — MAGNESIUM SULFATE 2 GM/50ML IV SOLN
2.0000 g | Freq: Once | INTRAVENOUS | Status: AC
  Administered 2023-08-12: 2 g via INTRAVENOUS
  Filled 2023-08-12: qty 50

## 2023-08-12 NOTE — Progress Notes (Signed)
 Ok to concurrently infuse Cisplatin and hydration fluids per Dr. Myna Hidalgo.

## 2023-08-12 NOTE — Patient Instructions (Signed)
 [facility] - A DEPT OF [facility]  Discharge Instructions: Thank you for choosing [facility] to provide your oncology and hematology care.   If you have a lab appointment with the Cancer Center, please go directly to the Cancer Center and check in at the registration area.  Wear comfortable clothing and clothing appropriate for easy access to any Portacath or PICC line.   We strive to give you quality time with your provider. You may need to reschedule your appointment if you arrive late (15 or more minutes).  Arriving late affects you and other patients whose appointments are after yours.  Also, if you miss three or more appointments without notifying the office, you may be dismissed from the clinic at the provider's discretion.      For prescription refill requests, have your pharmacy contact our office and allow 72 hours for refills to be completed.    Today you received the following chemotherapy and/or immunotherapy agents Cisplatin/VP 16      To help prevent nausea and vomiting after your treatment, we encourage you to take your nausea medication as directed.  BELOW ARE SYMPTOMS THAT SHOULD BE REPORTED IMMEDIATELY: *FEVER GREATER THAN 100.4 F (38 C) OR HIGHER *CHILLS OR SWEATING *NAUSEA AND VOMITING THAT IS NOT CONTROLLED WITH YOUR NAUSEA MEDICATION *UNUSUAL SHORTNESS OF BREATH *UNUSUAL BRUISING OR BLEEDING *URINARY PROBLEMS (pain or burning when urinating, or frequent urination) *BOWEL PROBLEMS (unusual diarrhea, constipation, pain near the anus) TENDERNESS IN MOUTH AND THROAT WITH OR WITHOUT PRESENCE OF ULCERS (sore throat, sores in mouth, or a toothache) UNUSUAL RASH, SWELLING OR PAIN  UNUSUAL VAGINAL DISCHARGE OR ITCHING   Items with * indicate a potential emergency and should be followed up as soon as possible or go to the Emergency Department if any problems should occur.  Please show the CHEMOTHERAPY ALERT CARD or  IMMUNOTHERAPY ALERT CARD at check-in to the Emergency Department and triage nurse. Should you have questions after your visit or need to cancel or reschedule your appointment, please contact [facility] - A DEPT OF Eligha Bridegroom [facility]  [phone] and follow the prompts.  Office hours are 8:00 a.m. to 4:30 p.m. Monday - Friday. Please note that voicemails left after 4:00 p.m. may not be returned until the following business day.  We are closed weekends and major holidays. You have access to a nurse at all times for urgent questions. Please call the main number to the clinic [phone] and follow the prompts.  For any non-urgent questions, you may also contact your provider using MyChart. We now offer e-Visits for anyone 87 and older to request care online for non-urgent symptoms. For details visit [email or web address].   Also download the MyChart app! Go to the app store, search "MyChart", open the app, select [place], and log in with your MyChart username and password.

## 2023-08-13 ENCOUNTER — Inpatient Hospital Stay: Payer: Managed Care, Other (non HMO)

## 2023-08-13 ENCOUNTER — Other Ambulatory Visit: Payer: Self-pay

## 2023-08-13 VITALS — BP 149/82 | HR 63 | Temp 97.7°F | Resp 20

## 2023-08-13 DIAGNOSIS — C6291 Malignant neoplasm of right testis, unspecified whether descended or undescended: Secondary | ICD-10-CM

## 2023-08-13 DIAGNOSIS — Z5111 Encounter for antineoplastic chemotherapy: Secondary | ICD-10-CM | POA: Diagnosis not present

## 2023-08-13 MED ORDER — MAGNESIUM SULFATE 2 GM/50ML IV SOLN
2.0000 g | Freq: Once | INTRAVENOUS | Status: AC
  Administered 2023-08-13: 2 g via INTRAVENOUS
  Filled 2023-08-13: qty 50

## 2023-08-13 MED ORDER — HEPARIN SOD (PORK) LOCK FLUSH 100 UNIT/ML IV SOLN
500.0000 [IU] | Freq: Once | INTRAVENOUS | Status: AC | PRN
  Administered 2023-08-13: 500 [IU]

## 2023-08-13 MED ORDER — SODIUM CHLORIDE 0.9% FLUSH
10.0000 mL | INTRAVENOUS | Status: DC | PRN
  Administered 2023-08-13: 10 mL

## 2023-08-13 MED ORDER — SODIUM CHLORIDE 0.9 % IV SOLN
20.0000 mg/m2 | Freq: Once | INTRAVENOUS | Status: AC
  Administered 2023-08-13: 44 mg via INTRAVENOUS
  Filled 2023-08-13: qty 44

## 2023-08-13 MED ORDER — SODIUM CHLORIDE 0.9 % IV SOLN
100.0000 mg/m2 | Freq: Once | INTRAVENOUS | Status: AC
  Administered 2023-08-13: 222 mg via INTRAVENOUS
  Filled 2023-08-13: qty 11.1

## 2023-08-13 MED ORDER — LORAZEPAM 2 MG/ML IJ SOLN
1.0000 mg | Freq: Once | INTRAMUSCULAR | Status: AC
  Administered 2023-08-13: 1 mg via INTRAVENOUS
  Filled 2023-08-13: qty 1

## 2023-08-13 MED ORDER — DEXAMETHASONE SODIUM PHOSPHATE 10 MG/ML IJ SOLN
10.0000 mg | Freq: Once | INTRAMUSCULAR | Status: AC
  Administered 2023-08-13: 10 mg via INTRAVENOUS
  Filled 2023-08-13: qty 1

## 2023-08-13 MED ORDER — POTASSIUM CHLORIDE IN NACL 20-0.9 MEQ/L-% IV SOLN
Freq: Once | INTRAVENOUS | Status: AC
  Filled 2023-08-13: qty 1000

## 2023-08-13 MED ORDER — SODIUM CHLORIDE 0.9 % IV SOLN: INTRAVENOUS | Status: DC

## 2023-08-13 NOTE — Patient Instructions (Signed)
 [facility]  Discharge Instructions: Thank you for choosing [facility] to provide your oncology and hematology care.   If you have a lab appointment with the Cancer Center, please go directly to the Cancer Center and check in at the registration area.  Wear comfortable clothing and clothing appropriate for easy access to any Portacath or PICC line.   We strive to give you quality time with your provider. You may need to reschedule your appointment if you arrive late (15 or more minutes).  Arriving late affects you and other patients whose appointments are after yours.  Also, if you miss three or more appointments without notifying the office, you may be dismissed from the clinic at the provider's discretion.      For prescription refill requests, have your pharmacy contact our office and allow 72 hours for refills to be completed.    Today you received the following chemotherapy and/or immunotherapy agents:  Cisplatin and VP-16      To help prevent nausea and vomiting after your treatment, we encourage you to take your nausea medication as directed.  BELOW ARE SYMPTOMS THAT SHOULD BE REPORTED IMMEDIATELY: *FEVER GREATER THAN 100.4 F (38 C) OR HIGHER *CHILLS OR SWEATING *NAUSEA AND VOMITING THAT IS NOT CONTROLLED WITH YOUR NAUSEA MEDICATION *UNUSUAL SHORTNESS OF BREATH *UNUSUAL BRUISING OR BLEEDING *URINARY PROBLEMS (pain or burning when urinating, or frequent urination) *BOWEL PROBLEMS (unusual diarrhea, constipation, pain near the anus) TENDERNESS IN MOUTH AND THROAT WITH OR WITHOUT PRESENCE OF ULCERS (sore throat, sores in mouth, or a toothache) UNUSUAL RASH, SWELLING OR PAIN  UNUSUAL VAGINAL DISCHARGE OR ITCHING   Items with * indicate a potential emergency and should be followed up as soon as possible or go to the Emergency Department if any problems should occur.  Please show the CHEMOTHERAPY ALERT CARD or  IMMUNOTHERAPY ALERT CARD at check-in to the Emergency Department and triage nurse. Should you have questions after your visit or need to cancel or reschedule your appointment, please contact [facility] - A DEPT OF [facility]  [phone] and follow the prompts.  Office hours are 8:00 a.m. to 4:30 p.m. Monday - Friday. Please note that voicemails left after 4:00 p.m. may not be returned until the following business day.  We are closed weekends and major holidays. You have access to a nurse at all times for urgent questions. Please call the main number to the clinic [phone] and follow the prompts.  For any non-urgent questions, you may also contact your provider using MyChart. We now offer e-Visits for anyone 49 and older to request care online for non-urgent symptoms. For details visit [email or web address].   Also download the MyChart app! Go to the app store, search "MyChart", open the app, select [place] [facility], and log in with your MyChart username and password.

## 2023-08-13 NOTE — Progress Notes (Signed)
 OK to run Cisplatin with hydration fluids per order of Dr. Myna Hidalgo.

## 2023-08-14 ENCOUNTER — Inpatient Hospital Stay: Payer: Managed Care, Other (non HMO)

## 2023-08-14 VITALS — BP 142/74 | HR 65 | Temp 97.7°F | Resp 18

## 2023-08-14 DIAGNOSIS — Z5111 Encounter for antineoplastic chemotherapy: Secondary | ICD-10-CM | POA: Diagnosis not present

## 2023-08-14 DIAGNOSIS — C6291 Malignant neoplasm of right testis, unspecified whether descended or undescended: Secondary | ICD-10-CM

## 2023-08-14 MED ORDER — SODIUM CHLORIDE 0.9 % IV SOLN
20.0000 mg/m2 | Freq: Once | INTRAVENOUS | Status: AC
  Administered 2023-08-14: 44 mg via INTRAVENOUS
  Filled 2023-08-14: qty 22

## 2023-08-14 MED ORDER — POTASSIUM CHLORIDE IN NACL 20-0.9 MEQ/L-% IV SOLN
Freq: Once | INTRAVENOUS | Status: AC
  Filled 2023-08-14: qty 1000

## 2023-08-14 MED ORDER — SODIUM CHLORIDE 0.9 % IV SOLN: INTRAVENOUS | Status: DC

## 2023-08-14 MED ORDER — MAGNESIUM SULFATE 2 GM/50ML IV SOLN
2.0000 g | Freq: Once | INTRAVENOUS | Status: AC
  Administered 2023-08-14: 2 g via INTRAVENOUS
  Filled 2023-08-14: qty 50

## 2023-08-14 MED ORDER — SODIUM CHLORIDE 0.9 % IV SOLN
100.0000 mg/m2 | Freq: Once | INTRAVENOUS | Status: AC
  Administered 2023-08-14: 222 mg via INTRAVENOUS
  Filled 2023-08-14: qty 11.1

## 2023-08-14 MED ORDER — SODIUM CHLORIDE 0.9 % IV SOLN
150.0000 mg | Freq: Once | INTRAVENOUS | Status: AC
  Administered 2023-08-14: 150 mg via INTRAVENOUS
  Filled 2023-08-14: qty 150

## 2023-08-14 MED ORDER — DEXAMETHASONE SODIUM PHOSPHATE 10 MG/ML IJ SOLN
10.0000 mg | Freq: Once | INTRAMUSCULAR | Status: AC
  Administered 2023-08-14: 10 mg via INTRAVENOUS
  Filled 2023-08-14: qty 1

## 2023-08-14 MED ORDER — PALONOSETRON HCL INJECTION 0.25 MG/5ML
0.2500 mg | Freq: Once | INTRAVENOUS | Status: AC
  Administered 2023-08-14: 0.25 mg via INTRAVENOUS
  Filled 2023-08-14: qty 5

## 2023-08-14 NOTE — Progress Notes (Signed)
 To run fluids concomitant with Cisplatin per Dr. Myna Hidalgo.

## 2023-08-16 ENCOUNTER — Other Ambulatory Visit: Payer: Self-pay

## 2023-08-17 ENCOUNTER — Inpatient Hospital Stay: Payer: Managed Care, Other (non HMO) | Attending: Hematology & Oncology

## 2023-08-17 VITALS — BP 129/74 | HR 92 | Temp 98.4°F | Resp 20

## 2023-08-17 DIAGNOSIS — C7800 Secondary malignant neoplasm of unspecified lung: Secondary | ICD-10-CM | POA: Diagnosis not present

## 2023-08-17 DIAGNOSIS — Z5111 Encounter for antineoplastic chemotherapy: Secondary | ICD-10-CM | POA: Diagnosis present

## 2023-08-17 DIAGNOSIS — Z5189 Encounter for other specified aftercare: Secondary | ICD-10-CM | POA: Diagnosis not present

## 2023-08-17 DIAGNOSIS — C6291 Malignant neoplasm of right testis, unspecified whether descended or undescended: Secondary | ICD-10-CM | POA: Insufficient documentation

## 2023-08-17 MED ORDER — PEGFILGRASTIM-CBQV 6 MG/0.6ML ~~LOC~~ SOSY
6.0000 mg | PREFILLED_SYRINGE | Freq: Once | SUBCUTANEOUS | Status: AC
Start: 2023-08-17 — End: 2023-08-17
  Administered 2023-08-17: 6 mg via SUBCUTANEOUS
  Filled 2023-08-17: qty 0.6

## 2023-08-17 NOTE — Patient Instructions (Signed)

## 2023-08-18 ENCOUNTER — Other Ambulatory Visit: Payer: Self-pay

## 2023-08-18 ENCOUNTER — Inpatient Hospital Stay: Payer: Managed Care, Other (non HMO)

## 2023-08-18 VITALS — BP 127/76 | HR 107 | Temp 97.9°F | Resp 18

## 2023-08-18 DIAGNOSIS — Z5111 Encounter for antineoplastic chemotherapy: Secondary | ICD-10-CM | POA: Diagnosis not present

## 2023-08-18 DIAGNOSIS — I8290 Acute embolism and thrombosis of unspecified vein: Secondary | ICD-10-CM

## 2023-08-18 DIAGNOSIS — C6291 Malignant neoplasm of right testis, unspecified whether descended or undescended: Secondary | ICD-10-CM

## 2023-08-18 MED ORDER — SODIUM CHLORIDE 0.9 % IV SOLN: INTRAVENOUS | Status: DC

## 2023-08-18 MED ORDER — PROCHLORPERAZINE MALEATE 10 MG PO TABS
10.0000 mg | ORAL_TABLET | Freq: Once | ORAL | Status: DC
Start: 2023-08-18 — End: 2023-08-18

## 2023-08-18 MED ORDER — SODIUM CHLORIDE 0.9 % IV SOLN
30.0000 [IU] | Freq: Once | INTRAVENOUS | Status: AC
  Administered 2023-08-18: 30 [IU] via INTRAVENOUS
  Filled 2023-08-18: qty 10

## 2023-08-18 MED ORDER — SODIUM CHLORIDE 0.9% FLUSH
10.0000 mL | INTRAVENOUS | Status: DC | PRN
  Administered 2023-08-18: 10 mL

## 2023-08-18 MED ORDER — HEPARIN SOD (PORK) LOCK FLUSH 100 UNIT/ML IV SOLN
500.0000 [IU] | Freq: Once | INTRAVENOUS | Status: AC | PRN
  Administered 2023-08-18: 500 [IU]

## 2023-08-18 MED ORDER — APIXABAN 5 MG PO TABS: 5.0000 mg | ORAL_TABLET | Freq: Two times a day (BID) | ORAL | 6 refills | Status: DC

## 2023-08-18 NOTE — Progress Notes (Signed)
 Ok to treat with HR 107 per DR Myna Hidalgo. Infuse 1L IVF. Per Dr Myna Hidalgo, no need to repeat labwork today. dph

## 2023-08-18 NOTE — Patient Instructions (Signed)
 [facility]  Discharge Instructions: Thank you for choosing [facility] to provide your oncology and hematology care.   If you have a lab appointment with the Cancer Center, please go directly to the Cancer Center and check in at the registration area.  Wear comfortable clothing and clothing appropriate for easy access to any Portacath or PICC line.   We strive to give you quality time with your provider. You may need to reschedule your appointment if you arrive late (15 or more minutes).  Arriving late affects you and other patients whose appointments are after yours.  Also, if you miss three or more appointments without notifying the office, you may be dismissed from the clinic at the provider's discretion.      For prescription refill requests, have your pharmacy contact our office and allow 72 hours for refills to be completed.    Today you received the following chemotherapy and/or immunotherapy agents bleomycin      To help prevent nausea and vomiting after your treatment, we encourage you to take your nausea medication as directed.  BELOW ARE SYMPTOMS THAT SHOULD BE REPORTED IMMEDIATELY: *FEVER GREATER THAN 100.4 F (38 C) OR HIGHER *CHILLS OR SWEATING *NAUSEA AND VOMITING THAT IS NOT CONTROLLED WITH YOUR NAUSEA MEDICATION *UNUSUAL SHORTNESS OF BREATH *UNUSUAL BRUISING OR BLEEDING *URINARY PROBLEMS (pain or burning when urinating, or frequent urination) *BOWEL PROBLEMS (unusual diarrhea, constipation, pain near the anus) TENDERNESS IN MOUTH AND THROAT WITH OR WITHOUT PRESENCE OF ULCERS (sore throat, sores in mouth, or a toothache) UNUSUAL RASH, SWELLING OR PAIN  UNUSUAL VAGINAL DISCHARGE OR ITCHING   Items with * indicate a potential emergency and should be followed up as soon as possible or go to the Emergency Department if any problems should occur.  Please show the CHEMOTHERAPY ALERT CARD or IMMUNOTHERAPY  ALERT CARD at check-in to the Emergency Department and triage nurse. Should you have questions after your visit or need to cancel or reschedule your appointment, please contact [facility] - A DEPT OF [facility]  [phone] and follow the prompts.  Office hours are 8:00 a.m. to 4:30 p.m. Monday - Friday. Please note that voicemails left after 4:00 p.m. may not be returned until the following business day.  We are closed weekends and major holidays. You have access to a nurse at all times for urgent questions. Please call the main number to the clinic [phone] and follow the prompts.  For any non-urgent questions, you may also contact your provider using MyChart. We now offer e-Visits for anyone 8 and older to request care online for non-urgent symptoms. For details visit [email or web address].   Also download the MyChart app! Go to the app store, search "MyChart", open the app, select [facility], and log in with your MyChart username and password.

## 2023-08-19 ENCOUNTER — Telehealth (HOSPITAL_BASED_OUTPATIENT_CLINIC_OR_DEPARTMENT_OTHER): Payer: Self-pay

## 2023-08-25 ENCOUNTER — Inpatient Hospital Stay: Payer: Managed Care, Other (non HMO)

## 2023-08-25 ENCOUNTER — Encounter: Payer: Self-pay | Admitting: *Deleted

## 2023-08-25 DIAGNOSIS — C6291 Malignant neoplasm of right testis, unspecified whether descended or undescended: Secondary | ICD-10-CM

## 2023-08-25 DIAGNOSIS — Z5111 Encounter for antineoplastic chemotherapy: Secondary | ICD-10-CM | POA: Diagnosis not present

## 2023-08-25 LAB — CMP (CANCER CENTER ONLY)
ALT: 15 U/L (ref 0–44)
AST: 19 U/L (ref 15–41)
Albumin: 4.2 g/dL (ref 3.5–5.0)
Alkaline Phosphatase: 66 U/L (ref 38–126)
Anion gap: 6 (ref 5–15)
BUN: 15 mg/dL (ref 6–20)
CO2: 28 mmol/L (ref 22–32)
Calcium: 8.7 mg/dL — ABNORMAL LOW (ref 8.9–10.3)
Chloride: 103 mmol/L (ref 98–111)
Creatinine: 0.84 mg/dL (ref 0.61–1.24)
GFR, Estimated: >60 mL/min (ref >60)
Glucose, Bld: 82 mg/dL (ref 70–99)
Potassium: 4 mmol/L (ref 3.5–5.1)
Sodium: 137 mmol/L (ref 135–145)
Total Bilirubin: 0.3 mg/dL (ref 0.0–1.2)
Total Protein: 6.5 g/dL (ref 6.5–8.1)

## 2023-08-25 LAB — CBC WITH DIFFERENTIAL (CANCER CENTER ONLY)
Abs Immature Granulocytes: 1.8 10*3/uL — ABNORMAL HIGH (ref 0.00–0.07)
Basophils Absolute: 0.2 10*3/uL — ABNORMAL HIGH (ref 0.0–0.1)
Basophils Relative: 2 %
Eosinophils Absolute: 0 10*3/uL (ref 0.0–0.5)
Eosinophils Relative: 0 %
HCT: 27.8 % — ABNORMAL LOW (ref 39.0–52.0)
Hemoglobin: 9.7 g/dL — ABNORMAL LOW (ref 13.0–17.0)
Lymphocytes Relative: 9 %
Lymphs Abs: 0.8 10*3/uL (ref 0.7–4.0)
MCH: 32.7 pg (ref 26.0–34.0)
MCHC: 34.9 g/dL (ref 30.0–36.0)
MCV: 93.6 fL (ref 80.0–100.0)
Metamyelocytes Relative: 7 %
Monocytes Absolute: 0.7 10*3/uL (ref 0.1–1.0)
Monocytes Relative: 8 %
Myelocytes: 12 %
Neutro Abs: 5.1 10*3/uL (ref 1.7–7.7)
Neutrophils Relative %: 60 %
Platelet Count: 82 10*3/uL — ABNORMAL LOW (ref 150–400)
Promyelocytes Relative: 2 %
RBC: 2.97 MIL/uL — ABNORMAL LOW (ref 4.22–5.81)
RDW: 17.6 % — ABNORMAL HIGH (ref 11.5–15.5)
Smear Review: NORMAL
WBC Count: 8.5 10*3/uL (ref 4.0–10.5)
WBC Morphology: 3
nRBC: 0.5 % — ABNORMAL HIGH (ref 0.0–0.2)

## 2023-08-25 LAB — LACTATE DEHYDROGENASE: LDH: 347 U/L — ABNORMAL HIGH (ref 98–192)

## 2023-08-25 MED ORDER — SODIUM CHLORIDE 0.9 % IV SOLN: Freq: Once | INTRAVENOUS | Status: DC | PRN

## 2023-08-25 MED ORDER — SODIUM CHLORIDE 0.9% FLUSH
10.0000 mL | INTRAVENOUS | Status: DC | PRN
Start: 2023-08-25 — End: 2023-08-25
  Administered 2023-08-25: 10 mL

## 2023-08-25 MED ORDER — EPINEPHRINE 0.3 MG/0.3ML IJ SOAJ: 0.3000 mg | Freq: Once | INTRAMUSCULAR | Status: DC | PRN

## 2023-08-25 MED ORDER — SODIUM CHLORIDE 0.9 % IV SOLN: INTRAVENOUS | Status: DC

## 2023-08-25 MED ORDER — METHYLPREDNISOLONE SODIUM SUCC 125 MG IJ SOLR
125.0000 mg | Freq: Once | INTRAMUSCULAR | Status: DC | PRN
Start: 2023-08-25 — End: 2023-08-25

## 2023-08-25 MED ORDER — HEPARIN SOD (PORK) LOCK FLUSH 100 UNIT/ML IV SOLN
500.0000 [IU] | Freq: Once | INTRAVENOUS | Status: AC | PRN
  Administered 2023-08-25: 500 [IU]

## 2023-08-25 MED ORDER — DIPHENHYDRAMINE HCL 50 MG/ML IJ SOLN
50.0000 mg | Freq: Once | INTRAMUSCULAR | Status: DC | PRN
Start: 2023-08-25 — End: 2023-08-25

## 2023-08-25 MED ORDER — PROCHLORPERAZINE MALEATE 10 MG PO TABS: 10.0000 mg | ORAL_TABLET | Freq: Once | ORAL | Status: DC

## 2023-08-25 MED ORDER — FAMOTIDINE IN NACL 20-0.9 MG/50ML-% IV SOLN: 20.0000 mg | Freq: Once | INTRAVENOUS | Status: DC | PRN

## 2023-08-25 MED ORDER — ALBUTEROL SULFATE HFA 108 (90 BASE) MCG/ACT IN AERS: 2.0000 | INHALATION_SPRAY | Freq: Once | RESPIRATORY_TRACT | Status: DC | PRN

## 2023-08-25 MED ORDER — SODIUM CHLORIDE 0.9 % IV SOLN
30.0000 [IU] | Freq: Once | INTRAVENOUS | Status: AC
  Administered 2023-08-25: 30 [IU] via INTRAVENOUS
  Filled 2023-08-25: qty 10

## 2023-08-25 NOTE — Patient Instructions (Signed)

## 2023-08-25 NOTE — Patient Instructions (Signed)
 [facility]  Discharge Instructions: Thank you for choosing [facility] to provide your oncology and hematology care.   If you have a lab appointment with the Cancer Center, please go directly to the Cancer Center and check in at the registration area.  Wear comfortable clothing and clothing appropriate for easy access to any Portacath or PICC line.   We strive to give you quality time with your provider. You may need to reschedule your appointment if you arrive late (15 or more minutes).  Arriving late affects you and other patients whose appointments are after yours.  Also, if you miss three or more appointments without notifying the office, you may be dismissed from the clinic at the provider's discretion.      For prescription refill requests, have your pharmacy contact our office and allow 72 hours for refills to be completed.    Today you received the following chemotherapy and/or immunotherapy agents: Bleomycin      To help prevent nausea and vomiting after your treatment, we encourage you to take your nausea medication as directed.  BELOW ARE SYMPTOMS THAT SHOULD BE REPORTED IMMEDIATELY: *FEVER GREATER THAN 100.4 F (38 C) OR HIGHER *CHILLS OR SWEATING *NAUSEA AND VOMITING THAT IS NOT CONTROLLED WITH YOUR NAUSEA MEDICATION *UNUSUAL SHORTNESS OF BREATH *UNUSUAL BRUISING OR BLEEDING *URINARY PROBLEMS (pain or burning when urinating, or frequent urination) *BOWEL PROBLEMS (unusual diarrhea, constipation, pain near the anus) TENDERNESS IN MOUTH AND THROAT WITH OR WITHOUT PRESENCE OF ULCERS (sore throat, sores in mouth, or a toothache) UNUSUAL RASH, SWELLING OR PAIN  UNUSUAL VAGINAL DISCHARGE OR ITCHING   Items with * indicate a potential emergency and should be followed up as soon as possible or go to the Emergency Department if any problems should occur.  Please show the CHEMOTHERAPY ALERT CARD or IMMUNOTHERAPY  ALERT CARD at check-in to the Emergency Department and triage nurse. Should you have questions after your visit or need to cancel or reschedule your appointment, please contact [facility] - A DEPT OF [facility] Brideg[facility]  [phone] and follow the prompts.  Office hours are 8:00 a.m. to 4:30 p.m. Monday - Friday. Please note that voicemails left after 4:00 p.m. may not be returned until the following business day.  We are closed weekends and major holidays. You have access to a nurse at all times for urgent questions. Please call the main number to the clinic [phone] and follow the prompts.  For any non-urgent questions, you may also contact your provider using MyChart. We now offer e-Visits for anyone 43 and older to request care online for non-urgent symptoms. For details visit [email or web address].   Also download the MyChart app! Go to the app store, search "MyChart", open the app, select [place], and log in with your MyChart username and password.

## 2023-08-25 NOTE — Progress Notes (Signed)
 Patient is here for his final chemo treatment. He will need post treatment CT (scheduled for 09/02/23) and PFTs. Called pulmonary department and left message requesting that PFTs be scheduled. They later called and patient is scheduled for 08/26/2023.  Oncology Nurse Navigator Documentation     08/25/2023    9:15 AM  Oncology Nurse Navigator Flowsheets  Phase of Treatment Chemotherapy  Chemotherapy Actual End Date: 08/25/2023  Navigator Follow Up Date: 09/02/2023  Navigator Follow Up Reason: Scan Review  Navigator Location [facility]  Navigator Encounter Type Treatment  Patient Visit Type MedOnc  Treatment Phase Final Chemo TX  Barriers/Navigation Needs No Barriers At This Time  Interventions Coordination of Care  Acuity Level 1-No Barriers  Coordination of Care Other  Support Groups/Services Friends and Family  Time Spent with Patient 30

## 2023-08-26 ENCOUNTER — Ambulatory Visit (HOSPITAL_COMMUNITY)
Admission: RE | Admit: 2023-08-26 | Discharge: 2023-08-26 | Disposition: A | Discharge status: Home / Self Care | Source: Ambulatory Visit | Attending: Hematology & Oncology | Admitting: Hematology & Oncology

## 2023-08-26 ENCOUNTER — Encounter: Payer: Self-pay | Admitting: *Deleted

## 2023-08-26 DIAGNOSIS — C6291 Malignant neoplasm of right testis, unspecified whether descended or undescended: Secondary | ICD-10-CM | POA: Insufficient documentation

## 2023-08-26 LAB — PULMONARY FUNCTION TEST
DL/VA % pred: 76 %
DL/VA: 3.71 ml/min/mmHg/L
DLCO cor % pred: 78 %
DLCO cor: 28.22 ml/min/mmHg
DLCO unc % pred: 65 %
DLCO unc: 23.36 ml/min/mmHg
FEF 25-75 Pre: 5.08 L/s
FEF2575-%Pred-Pre: 105 %
FEV1-%Pred-Pre: 105 %
FEV1-Pre: 5.13 L
FEV1FVC-%Pred-Pre: 98 %
FEV6-%Pred-Pre: 106 %
FEV6-Pre: 6.32 L
FEV6FVC-%Pred-Pre: 101 %
FVC-%Pred-Pre: 105 %
FVC-Pre: 6.32 L
Pre FEV1/FVC ratio: 81 %
Pre FEV6/FVC Ratio: 100 %
RV % pred: 110 %
RV: 1.96 L
TLC % pred: 108 %
TLC: 8.15 L

## 2023-08-26 LAB — BETA HCG QUANT (REF LAB): hCG Quant: <1 m[IU]/mL (ref 0–3)

## 2023-08-26 MED ORDER — ALBUTEROL SULFATE (2.5 MG/3ML) 0.083% IN NEBU: 2.5000 mg | INHALATION_SOLUTION | Freq: Once | RESPIRATORY_TRACT | Status: DC

## 2023-09-02 ENCOUNTER — Encounter (HOSPITAL_BASED_OUTPATIENT_CLINIC_OR_DEPARTMENT_OTHER): Payer: Self-pay

## 2023-09-02 ENCOUNTER — Ambulatory Visit (HOSPITAL_BASED_OUTPATIENT_CLINIC_OR_DEPARTMENT_OTHER)
Admission: RE | Admit: 2023-09-02 | Discharge: 2023-09-02 | Disposition: A | Discharge status: Home / Self Care | Source: Ambulatory Visit | Attending: Hematology & Oncology | Admitting: Hematology & Oncology

## 2023-09-02 DIAGNOSIS — C6291 Malignant neoplasm of right testis, unspecified whether descended or undescended: Secondary | ICD-10-CM | POA: Insufficient documentation

## 2023-09-02 MED ORDER — IOHEXOL 300 MG/ML  SOLN
100.0000 mL | Freq: Once | INTRAMUSCULAR | Status: AC | PRN
  Administered 2023-09-02: 100 mL via INTRAVENOUS

## 2023-09-03 ENCOUNTER — Encounter: Payer: Self-pay | Admitting: *Deleted

## 2023-09-03 NOTE — Progress Notes (Signed)
 Reviewed CT which shows treatment response. Result message sent to patient via MyChart.   Oncology Nurse Navigator Documentation     09/03/2023    7:30 AM  Oncology Nurse Navigator Flowsheets  Navigator Follow Up Date: 09/14/2023  Navigator Follow Up Reason: Follow-up Appointment  Navigator Location [facility]  Navigator Encounter Type Scan Review;MyChart  Patient Visit Type MedOnc  Treatment Phase Post-Tx Follow-up  Barriers/Navigation Needs No Barriers At This Time  Interventions Education  Acuity Level 1-No Barriers  Education Method Written  Support Groups/Services Friends and Family  Time Spent with Patient 15

## 2023-09-14 ENCOUNTER — Inpatient Hospital Stay: Payer: Managed Care, Other (non HMO)

## 2023-09-14 ENCOUNTER — Other Ambulatory Visit: Payer: Self-pay

## 2023-09-14 ENCOUNTER — Ambulatory Visit (HOSPITAL_BASED_OUTPATIENT_CLINIC_OR_DEPARTMENT_OTHER)
Admission: RE | Admit: 2023-09-14 | Discharge: 2023-09-14 | Disposition: A | Discharge status: Home / Self Care | Source: Ambulatory Visit | Attending: Hematology & Oncology | Admitting: Hematology & Oncology

## 2023-09-14 ENCOUNTER — Inpatient Hospital Stay (HOSPITAL_BASED_OUTPATIENT_CLINIC_OR_DEPARTMENT_OTHER): Payer: Managed Care, Other (non HMO) | Admitting: Hematology & Oncology

## 2023-09-14 ENCOUNTER — Encounter: Payer: Self-pay | Admitting: Hematology & Oncology

## 2023-09-14 VITALS — BP 141/78 | HR 64 | Temp 97.9°F | Resp 18 | Ht 73.0 in | Wt 210.0 lb

## 2023-09-14 DIAGNOSIS — I8290 Acute embolism and thrombosis of unspecified vein: Secondary | ICD-10-CM

## 2023-09-14 DIAGNOSIS — C6292 Malignant neoplasm of left testis, unspecified whether descended or undescended: Secondary | ICD-10-CM

## 2023-09-14 DIAGNOSIS — C6291 Malignant neoplasm of right testis, unspecified whether descended or undescended: Secondary | ICD-10-CM

## 2023-09-14 DIAGNOSIS — C801 Malignant (primary) neoplasm, unspecified: Secondary | ICD-10-CM | POA: Diagnosis present

## 2023-09-14 DIAGNOSIS — C7802 Secondary malignant neoplasm of left lung: Secondary | ICD-10-CM | POA: Insufficient documentation

## 2023-09-14 LAB — CMP (CANCER CENTER ONLY)
ALT: 41 U/L (ref 0–44)
AST: 29 U/L (ref 15–41)
Albumin: 4.4 g/dL (ref 3.5–5.0)
Alkaline Phosphatase: 52 U/L (ref 38–126)
Anion gap: 7 (ref 5–15)
BUN: 16 mg/dL (ref 6–20)
CO2: 28 mmol/L (ref 22–32)
Calcium: 9.2 mg/dL (ref 8.9–10.3)
Chloride: 105 mmol/L (ref 98–111)
Creatinine: 0.95 mg/dL (ref 0.61–1.24)
GFR, Estimated: >60 mL/min (ref >60)
Glucose, Bld: 94 mg/dL (ref 70–99)
Potassium: 3.9 mmol/L (ref 3.5–5.1)
Sodium: 140 mmol/L (ref 135–145)
Total Bilirubin: 0.4 mg/dL (ref 0.0–1.2)
Total Protein: 7.1 g/dL (ref 6.5–8.1)

## 2023-09-14 LAB — CBC WITH DIFFERENTIAL (CANCER CENTER ONLY)
Abs Immature Granulocytes: 0.04 10*3/uL (ref 0.00–0.07)
Basophils Absolute: 0.1 10*3/uL (ref 0.0–0.1)
Basophils Relative: 1 %
Eosinophils Absolute: 0.1 10*3/uL (ref 0.0–0.5)
Eosinophils Relative: 1 %
HCT: 34.7 % — ABNORMAL LOW (ref 39.0–52.0)
Hemoglobin: 12 g/dL — ABNORMAL LOW (ref 13.0–17.0)
Immature Granulocytes: 1 %
Lymphocytes Relative: 19 %
Lymphs Abs: 1 10*3/uL (ref 0.7–4.0)
MCH: 33.1 pg (ref 26.0–34.0)
MCHC: 34.6 g/dL (ref 30.0–36.0)
MCV: 95.9 fL (ref 80.0–100.0)
Monocytes Absolute: 0.5 10*3/uL (ref 0.1–1.0)
Monocytes Relative: 10 %
Neutro Abs: 3.3 10*3/uL (ref 1.7–7.7)
Neutrophils Relative %: 68 %
Platelet Count: 222 10*3/uL (ref 150–400)
RBC: 3.62 MIL/uL — ABNORMAL LOW (ref 4.22–5.81)
RDW: 17.2 % — ABNORMAL HIGH (ref 11.5–15.5)
WBC Count: 4.9 10*3/uL (ref 4.0–10.5)
nRBC: 0 % (ref 0.0–0.2)

## 2023-09-14 LAB — LACTATE DEHYDROGENASE: LDH: 176 U/L (ref 98–192)

## 2023-09-14 MED ORDER — HEPARIN SOD (PORK) LOCK FLUSH 100 UNIT/ML IV SOLN
500.0000 [IU] | Freq: Once | INTRAVENOUS | Status: AC
  Administered 2023-09-14: 500 [IU] via INTRAVENOUS

## 2023-09-14 MED ORDER — SODIUM CHLORIDE 0.9% FLUSH
10.0000 mL | INTRAVENOUS | Status: DC | PRN
  Administered 2023-09-14: 10 mL via INTRAVENOUS

## 2023-09-14 NOTE — Progress Notes (Unsigned)
 Yes think tomorrow the hospital saw song back in yesterday really really Hematology and Oncology Follow Up Visit  Daniel Shepherd [id number] 05-30-1996 30 y.o. 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 twice daily -started on 07/13/2023     Interim History:  [name] is  back for follow-up.  This is his third and final cycle of chemotherapy.   He is doing pretty well right now.  Thankfully, there is been no issues with respect to rectal bleeding.  Also not sure as why or how he had that.  This is a very self-limited.  His last beta-hCG was less than 1.  I think this is a very good indicator that he is responding well.  His last LDH was 195.  He has had no cough or shortness of breath.  He has had little bit of a fever after the bleomycin.  Exam no change in or bladder habits.  He has had no headache.  He does have a rash on his back.  Again I suspect this probably is from the bleomycin.  He has had no leg swelling.  He has had no weakness.  He does have a lot of fatigue which is surprising.  Overall, he is doing well on the Eliquis.  We will continue him on this for right now.  Overall, I would say that his performance status is ECOG 0.      Medications:  Current Outpatient Medications:    apixaban (ELIQUIS) 5 MG TABS tablet, Take 1 tablet (5 mg total) by mouth 2 (two) times daily., Disp: 60 tablet, Rfl: 6   dexamethasone (DECADRON) 4 MG tablet, Take 2 tablets (8 mg total) by mouth daily. Start the day after last cisplatin dose on day 6 of chemo x 3 days.Take with food., Disp: 30 tablet, Rfl: 1   HYDROcodone-acetaminophen (NORCO) 5-325 MG tablet,  Take 1 tablet by mouth every 6 (six) hours as needed for moderate pain (pain score 4-6)., Disp: 60 tablet, Rfl: 0   lidocaine-prilocaine (EMLA) cream, Apply to affected area once, Disp: 30 g, Rfl: 3   losartan (COZAAR) 25 MG tablet, Take 12.5 mg by mouth daily., Disp: , Rfl:    Multiple Vitamin (MULTIVITAMIN) tablet, Take 1 tablet by mouth daily., Disp: , Rfl:    OLANZapine (ZYPREXA) 10 MG tablet, Take 1 tablet (10 mg total) by mouth at bedtime., Disp: 30 tablet, Rfl: 0   ondansetron (ZOFRAN) 8 MG tablet, Take 1 tablet (8 mg total) by mouth every 8 (eight) hours as needed for nausea or vomiting., Disp: 20 tablet, Rfl: 3   prochlorperazine (COMPAZINE) 10 MG tablet, Take 1 tablet (10 mg total) by mouth every 6 (six) hours as needed for nausea or vomiting., Disp: 30 tablet, Rfl: 1   testosterone cypionate (DEPOTESTOSTERONE CYPIONATE) 200 MG/ML injection, Inject into the muscle every 7 (seven) days., Disp: , Rfl:  No current facility-administered medications for this visit.  Facility-Administered Medications Ordered in Other Visits:    0.9 %  sodium chloride infusion, , Intravenous, Continuous, Giovanni Bath, Rose Phi, MD, Stopped at 06/26/23 1112   pegfilgrastim (NEULASTA ONPRO KIT) injection 6 mg, 6 mg, Subcutaneous, Once, Erenest Blank, NP  sodium chloride flush (NS) 0.9 % injection 10 mL, 10 mL, Intracatheter, PRN, Josph Macho, MD, 10 mL at 06/26/23 1352  Allergies:  Allergies  Allergen Reactions   Bee Venom Anaphylaxis   Other Swelling    Bee allergy    Past Medical History, Surgical history, Social history, and Family History were reviewed and updated.  Review of Systems: Review of Systems  Constitutional:  Positive for fatigue.  HENT:  Negative.    Eyes: Negative.   Respiratory:  Positive for shortness of breath.   Cardiovascular: Negative.   Gastrointestinal:  Positive for nausea.  Endocrine: Negative.   Genitourinary: Negative.    Musculoskeletal: Negative.   Skin: Negative.    Neurological: Negative.   Hematological: Negative.   Psychiatric/Behavioral: Negative.      Physical Exam:  height is 6\' 1"  (1.854 m) and weight is 210 lb (95.3 kg). His oral temperature is 97.9 F (36.6 C). His blood pressure is 141/78 (abnormal) and his pulse is 64. His respiration is 18 and oxygen saturation is 100%.   Wt Readings from Last 3 Encounters:  09/14/23 210 lb (95.3 kg)  08/10/23 210 lb (95.3 kg)  07/20/23 207 lb (93.9 kg)    Physical Exam Vitals reviewed.  HENT:     Head: Normocephalic and atraumatic.  Eyes:     Pupils: Pupils are equal, round, and reactive to light.  Cardiovascular:     Rate and Rhythm: Normal rate and regular rhythm.     Heart sounds: Normal heart sounds.  Pulmonary:     Effort: Pulmonary effort is normal.     Breath sounds: Normal breath sounds.  Abdominal:     General: Bowel sounds are normal.     Palpations: Abdomen is soft.     Comments: Has a well-healed left inguinal orchiectomy scar.  There is no swelling.  There is no erythema.  There is no tenderness.  Genitourinary:    Comments: Scrotal exam does show a localized area of swelling.  This is in the upper portion of the scrotum.  It is slightly tender.  It probably measures about 1 x 1 cm.  The rest of the scrotum is unremarkable.  He is status post left orchiectomy.  There is no inguinal adenopathy in the right inguinal chain. Musculoskeletal:        General: No tenderness or deformity. Normal range of motion.     Cervical back: Normal range of motion.  Lymphadenopathy:     Cervical: No cervical adenopathy.  Skin:    General: Skin is warm and dry.     Findings: No erythema or rash.  Neurological:     Mental Status: He is alert and oriented to person, place, and time.  Psychiatric:        Behavior: Behavior normal.        Thought Content: Thought content normal.        Judgment: Judgment normal.      Lab Results  Component Value Date   WBC 4.9 09/14/2023   HGB 12.0  (L) 09/14/2023   HCT 34.7 (L) 09/14/2023   MCV 95.9 09/14/2023   PLT 222 09/14/2023     Chemistry      Component Value Date/Time   NA 140 09/14/2023 0950   NA 141 10/29/2022 0931   K 3.9 09/14/2023 0950   CL 105 09/14/2023 0950   CO2 28 09/14/2023 0950   BUN 16 09/14/2023 0950   BUN 18 10/29/2022 0931   CREATININE 0.95 09/14/2023  4098      Component Value Date/Time   CALCIUM 9.2 09/14/2023 0950   ALKPHOS 52 09/14/2023 0950   AST 29 09/14/2023 0950   ALT 41 09/14/2023 0950   BILITOT 0.4 09/14/2023 0950      Impression and Plan: Mr. Brodhead is a 28 year old white male with metastatic embryonal cell carcinoma.  He had a left orchiectomy.  This was performed back on July 03, 2017.  A week later, he showed up in the emergency room with chest discomfort and some shortness of breath.  He had innumerable pulmonary nodules.  He was treated with systemic chemotherapy.  He had a complete response.  He then developed a second testicular cancer in the right testicle.  This was a pure seminoma.  It was early-stage.  Now, the seminoma has relapsed.  This will be his third and final cycle of chemotherapy.  After this cycle, we will go ahead and repeat his scans.  I think through next question is whether or not he will need a RPLND.  This is certainly might be reasonable.  He is young.  He is in good health.  This clearly is a very unusual circumstance.  A RPLND is certainly eradicate any microscopic disease that he has.  I will have to speak with Urology about this.  Again we will have to set him up with CT scan.  He will also need to have pulmonary function test since he is getting the bleomycin.  I will plan to get back to see me in another month or so.   Josph Macho, MD 3/31/20252:57 PM

## 2023-09-14 NOTE — Patient Instructions (Signed)

## 2023-09-15 ENCOUNTER — Encounter: Payer: Self-pay | Admitting: Hematology & Oncology

## 2023-09-15 ENCOUNTER — Encounter: Payer: Self-pay | Admitting: *Deleted

## 2023-09-15 ENCOUNTER — Other Ambulatory Visit: Payer: Self-pay

## 2023-09-15 LAB — BETA HCG QUANT (REF LAB): hCG Quant: <1 m[IU]/mL (ref 0–3)

## 2023-09-15 MED ORDER — APIXABAN 2.5 MG PO TABS: 2.5000 mg | ORAL_TABLET | Freq: Two times a day (BID) | ORAL | 6 refills | Status: AC

## 2023-09-15 NOTE — Progress Notes (Signed)
 Patient has completed treatment and CT scans show good response. He will be considered for consolidative radiation therapy based on PET scheduled for 09/17/2023.  Oncology Nurse Navigator Documentation     09/15/2023    9:00 AM  Oncology Nurse Navigator Flowsheets  Navigator Follow Up Date: 09/17/2023  Navigator Follow Up Reason: Scan Review  Navigator Location [facility]  Navigator Encounter Type Appt/Treatment Plan Review  Patient Visit Type MedOnc  Treatment Phase Post-Tx Follow-up  Barriers/Navigation Needs No Barriers At This Time  Interventions None Required  Acuity Level 1-No Barriers  Support Groups/Services Friends and Family  Time Spent with Patient 15

## 2023-09-17 ENCOUNTER — Encounter (HOSPITAL_COMMUNITY)
Admission: RE | Admit: 2023-09-17 | Discharge: 2023-09-17 | Disposition: A | Discharge status: Home / Self Care | Source: Ambulatory Visit | Attending: Hematology & Oncology | Admitting: Hematology & Oncology

## 2023-09-17 DIAGNOSIS — C801 Malignant (primary) neoplasm, unspecified: Secondary | ICD-10-CM | POA: Insufficient documentation

## 2023-09-17 DIAGNOSIS — C6292 Malignant neoplasm of left testis, unspecified whether descended or undescended: Secondary | ICD-10-CM | POA: Diagnosis present

## 2023-09-17 DIAGNOSIS — C7802 Secondary malignant neoplasm of left lung: Secondary | ICD-10-CM | POA: Diagnosis present

## 2023-09-17 LAB — GLUCOSE, CAPILLARY: Glucose-Capillary: 92 mg/dL (ref 70–99)

## 2023-09-17 MED ORDER — FLUDEOXYGLUCOSE F - 18 (FDG) INJECTION
9.2180 | Freq: Once | INTRAVENOUS | Status: AC
  Administered 2023-09-17: 9.218 via INTRAVENOUS

## 2023-09-18 ENCOUNTER — Encounter: Payer: Self-pay | Admitting: *Deleted

## 2023-09-18 NOTE — Progress Notes (Signed)
 Reviewed patient's PET which shows CR. At this time Dr Myna Hidalgo would like to defer any radiation plan. PET scan report routed to Dr Roselind Messier.   Oncology Nurse Navigator Documentation     09/18/2023   11:45 AM  Oncology Nurse Navigator Flowsheets  Navigator Follow Up Date: 10/26/2023  Navigator Follow Up Reason: Follow-up Appointment  Navigator Location [facility]  Navigator Encounter Type Scan Review  Patient Visit Type MedOnc  Treatment Phase Post-Tx Follow-up  Barriers/Navigation Needs No Barriers At This Time  Interventions Coordination of Care  Acuity Level 1-No Barriers  Coordination of Care Other  Support Groups/Services Friends and Family  Time Spent with Patient 15

## 2023-09-24 ENCOUNTER — Encounter: Payer: Self-pay | Admitting: *Deleted

## 2023-10-26 ENCOUNTER — Other Ambulatory Visit: Payer: Self-pay

## 2023-10-26 ENCOUNTER — Inpatient Hospital Stay: Attending: Hematology & Oncology

## 2023-10-26 ENCOUNTER — Inpatient Hospital Stay (HOSPITAL_BASED_OUTPATIENT_CLINIC_OR_DEPARTMENT_OTHER): Admitting: Hematology & Oncology

## 2023-10-26 ENCOUNTER — Inpatient Hospital Stay

## 2023-10-26 ENCOUNTER — Encounter: Payer: Self-pay | Admitting: Hematology & Oncology

## 2023-10-26 VITALS — BP 131/95 | HR 88 | Temp 97.3°F | Resp 18 | Ht 73.0 in | Wt 217.0 lb

## 2023-10-26 DIAGNOSIS — C6291 Malignant neoplasm of right testis, unspecified whether descended or undescended: Secondary | ICD-10-CM | POA: Diagnosis present

## 2023-10-26 DIAGNOSIS — I82890 Acute embolism and thrombosis of other specified veins: Secondary | ICD-10-CM | POA: Diagnosis not present

## 2023-10-26 DIAGNOSIS — C6292 Malignant neoplasm of left testis, unspecified whether descended or undescended: Secondary | ICD-10-CM | POA: Diagnosis not present

## 2023-10-26 DIAGNOSIS — C7802 Secondary malignant neoplasm of left lung: Secondary | ICD-10-CM

## 2023-10-26 LAB — CBC WITH DIFFERENTIAL (CANCER CENTER ONLY)
Abs Immature Granulocytes: 0 10*3/uL (ref 0.00–0.07)
Basophils Absolute: 0 10*3/uL (ref 0.0–0.1)
Basophils Relative: 1 %
Eosinophils Absolute: 0.4 10*3/uL (ref 0.0–0.5)
Eosinophils Relative: 9 %
HCT: 39.8 % (ref 39.0–52.0)
Hemoglobin: 13.7 g/dL (ref 13.0–17.0)
Immature Granulocytes: 0 %
Lymphocytes Relative: 25 %
Lymphs Abs: 1.2 10*3/uL (ref 0.7–4.0)
MCH: 32.4 pg (ref 26.0–34.0)
MCHC: 34.4 g/dL (ref 30.0–36.0)
MCV: 94.1 fL (ref 80.0–100.0)
Monocytes Absolute: 0.4 10*3/uL (ref 0.1–1.0)
Monocytes Relative: 7 %
Neutro Abs: 2.8 10*3/uL (ref 1.7–7.7)
Neutrophils Relative %: 58 %
Platelet Count: 232 10*3/uL (ref 150–400)
RBC: 4.23 MIL/uL (ref 4.22–5.81)
RDW: 10.9 % — ABNORMAL LOW (ref 11.5–15.5)
WBC Count: 4.9 10*3/uL (ref 4.0–10.5)
nRBC: 0 % (ref 0.0–0.2)

## 2023-10-26 LAB — CMP (CANCER CENTER ONLY)
ALT: 20 U/L (ref 0–44)
AST: 18 U/L (ref 15–41)
Albumin: 4.4 g/dL (ref 3.5–5.0)
Alkaline Phosphatase: 67 U/L (ref 38–126)
Anion gap: 8 (ref 5–15)
BUN: 16 mg/dL (ref 6–20)
CO2: 26 mmol/L (ref 22–32)
Calcium: 9.3 mg/dL (ref 8.9–10.3)
Chloride: 103 mmol/L (ref 98–111)
Creatinine: 1.32 mg/dL — ABNORMAL HIGH (ref 0.61–1.24)
GFR, Estimated: >60 mL/min (ref >60)
Glucose, Bld: 134 mg/dL — ABNORMAL HIGH (ref 70–99)
Potassium: 4 mmol/L (ref 3.5–5.1)
Sodium: 137 mmol/L (ref 135–145)
Total Bilirubin: 0.5 mg/dL (ref 0.0–1.2)
Total Protein: 7.1 g/dL (ref 6.5–8.1)

## 2023-10-26 LAB — LACTATE DEHYDROGENASE: LDH: 196 U/L — ABNORMAL HIGH (ref 98–192)

## 2023-10-26 MED ORDER — SODIUM CHLORIDE 0.9% FLUSH
10.0000 mL | INTRAVENOUS | Status: DC | PRN
  Administered 2023-10-26: 10 mL via INTRAVENOUS

## 2023-10-26 MED ORDER — HEPARIN SOD (PORK) LOCK FLUSH 100 UNIT/ML IV SOLN
500.0000 [IU] | Freq: Once | INTRAVENOUS | Status: AC
Start: 2023-10-26 — End: 2023-10-26
  Administered 2023-10-26: 500 [IU] via INTRAVENOUS

## 2023-10-26 NOTE — Patient Instructions (Signed)

## 2023-10-26 NOTE — Progress Notes (Signed)
 Yes think tomorrow the hospital saw song back in yesterday really really Hematology and Oncology Follow Up Visit  Gradon Geraty [id number] Sep 26, 1995 28 y.o. 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 twice daily -started on 07/13/2023     Interim History:  Mr. Mastro is  back for follow-up.  He is doing pretty well right now.  His main complaint is been some pain in the right lower quadrant of the abdomen.  He says he not been taking the Eliquis  as scheduled.  He says when he started taking it more regularly, the pain improved.  I know that he does have a thrombus in the right gonadal vein.  His last CT scan was done back in March.  This showed that he had a great response with no residual disease.  He has had no cough or shortness of breath.  He has had no nausea or vomiting.  Has had no change in bowel or bladder habits.  He has had no leg swelling.  He is still working.  His last tumor markers showed a beta-hCG less than 1.  His LDH today was 196.  He has had no obvious bleeding.  He has had no headache.  Overall, I would say that his performance status is about ECOG 1.   Medications:  Current Outpatient Medications:    apixaban  (ELIQUIS ) 2.5 MG TABS tablet, Take 1 tablet (2.5 mg total) by mouth 2 (two) times daily., Disp: 60 tablet, Rfl: 6   dexamethasone  (DECADRON ) 4 MG tablet, Take 2 tablets (8 mg total) by mouth daily. Start the day after last cisplatin  dose on day 6 of chemo x 3 days.Take with food., Disp: 30 tablet, Rfl: 1   HYDROcodone -acetaminophen  (NORCO) 5-325 MG tablet, Take 1 tablet by mouth every 6 (six)  hours as needed for moderate pain (pain score 4-6)., Disp: 60 tablet, Rfl: 0   lidocaine -prilocaine  (EMLA ) cream, Apply to affected area once, Disp: 30 g, Rfl: 3   losartan  (COZAAR ) 25 MG tablet, Take 12.5 mg by mouth daily., Disp: , Rfl:    Multiple Vitamin (MULTIVITAMIN) tablet, Take 1 tablet by mouth daily., Disp: , Rfl:    OLANZapine  (ZYPREXA ) 10 MG tablet, Take 1 tablet (10 mg total) by mouth at bedtime., Disp: 30 tablet, Rfl: 0   ondansetron  (ZOFRAN ) 8 MG tablet, Take 1 tablet (8 mg total) by mouth every 8 (eight) hours as needed for nausea or vomiting., Disp: 20 tablet, Rfl: 3   prochlorperazine  (COMPAZINE ) 10 MG tablet, Take 1 tablet (10 mg total) by mouth every 6 (six) hours as needed for nausea or vomiting., Disp: 30 tablet, Rfl: 1   testosterone cypionate (DEPOTESTOSTERONE CYPIONATE) 200 MG/ML injection, Inject into the muscle every 7 (seven) days., Disp: , Rfl:  No current facility-administered medications for this visit.  Facility-Administered Medications Ordered in Other Visits:    0.9 %  sodium chloride  infusion, , Intravenous, Continuous, Riven Beebe, Sherryll Donald, MD, Stopped at 06/26/23 1112   pegfilgrastim  (NEULASTA  ONPRO KIT) injection 6 mg, 6 mg, Subcutaneous, Once, Tish Forge, NP   sodium chloride  flush (NS) 0.9 % injection 10 mL, 10 mL, Intracatheter, PRN, Dimitra Woodstock R,  MD, 10 mL at 06/26/23 1352  Allergies:  Allergies  Allergen Reactions   Bee Venom Anaphylaxis   Other Swelling    Bee allergy    Past Medical History, Surgical history, Social history, and Family History were reviewed and updated.  Review of Systems: Review of Systems  Constitutional:  Positive for fatigue.  HENT:  Negative.    Eyes: Negative.   Respiratory:  Positive for shortness of breath.   Cardiovascular: Negative.   Gastrointestinal:  Positive for nausea.  Endocrine: Negative.   Genitourinary: Negative.    Musculoskeletal: Negative.   Skin: Negative.   Neurological: Negative.    Hematological: Negative.   Psychiatric/Behavioral: Negative.      Physical Exam:  height is 6\' 1"  (1.854 m) and weight is 217 lb (98.4 kg). His oral temperature is 97.3 F (36.3 C) (abnormal). His blood pressure is 131/95 (abnormal) and his pulse is 88. His respiration is 18 and oxygen saturation is 99%.   Wt Readings from Last 3 Encounters:  10/26/23 217 lb (98.4 kg)  09/14/23 210 lb (95.3 kg)  08/10/23 210 lb (95.3 kg)    Physical Exam Vitals reviewed.  HENT:     Head: Normocephalic and atraumatic.  Eyes:     Pupils: Pupils are equal, round, and reactive to light.  Cardiovascular:     Rate and Rhythm: Normal rate and regular rhythm.     Heart sounds: Normal heart sounds.  Pulmonary:     Effort: Pulmonary effort is normal.     Breath sounds: Normal breath sounds.  Abdominal:     General: Bowel sounds are normal.     Palpations: Abdomen is soft.     Comments: Has a well-healed left inguinal orchiectomy scar.  There is no swelling.  There is no erythema.  There is no tenderness.  Musculoskeletal:        General: No tenderness or deformity. Normal range of motion.     Cervical back: Normal range of motion.  Lymphadenopathy:     Cervical: No cervical adenopathy.  Skin:    General: Skin is warm and dry.     Findings: No erythema or rash.  Neurological:     Mental Status: He is alert and oriented to person, place, and time.  Psychiatric:        Behavior: Behavior normal.        Thought Content: Thought content normal.        Judgment: Judgment normal.      Lab Results  Component Value Date   WBC 4.9 10/26/2023   HGB 13.7 10/26/2023   HCT 39.8 10/26/2023   MCV 94.1 10/26/2023   PLT 232 10/26/2023     Chemistry      Component Value Date/Time   NA 137 10/26/2023 0930   NA 141 10/29/2022 0931   K 4.0 10/26/2023 0930   CL 103 10/26/2023 0930   CO2 26 10/26/2023 0930   BUN 16 10/26/2023 0930   BUN 18 10/29/2022 0931   CREATININE 1.32 (H) 10/26/2023 0930       Component Value Date/Time   CALCIUM 9.3 10/26/2023 0930   ALKPHOS 67 10/26/2023 0930   AST 18 10/26/2023 0930   ALT 20 10/26/2023 0930   BILITOT 0.5 10/26/2023 0930      Impression and Plan: Mr. Rowden is a 28 year old white male with metastatic embryonal cell carcinoma.  He had a left orchiectomy.  This was performed back on July 03, 2017.  A week later, he showed  up in the emergency room with chest discomfort and some shortness of breath.  He had innumerable pulmonary nodules.  He was treated with systemic chemotherapy.  He had a complete response.  He then developed a second testicular cancer in the right testicle.  This was a pure seminoma.  It was early-stage.  Now, the seminoma has relapsed.  He has completed his systemic chemotherapy.  Hopefully, this pain is just from the thrombus in the right gonadal vein.  However, we are going to have to do a scan to see everything looks.  I realize that he is at risk for recurrence.  We will see what his tumor markers have to show.  Hopefully, everything looks fine.  He still has his Port-A-Cath in.  I will make sure that we flushes.  We will plan to get him back to see us  in another couple months.    Ivor Mars, MD 5/12/202510:19 AM

## 2023-10-27 ENCOUNTER — Ambulatory Visit: Payer: Self-pay | Admitting: Hematology & Oncology

## 2023-10-27 ENCOUNTER — Encounter: Payer: Self-pay | Admitting: *Deleted

## 2023-10-27 LAB — AFP TUMOR MARKER: AFP, Serum, Tumor Marker: 4.3 ng/mL (ref 0.0–5.7)

## 2023-10-27 LAB — BETA HCG QUANT (REF LAB): hCG Quant: <1 m[IU]/mL (ref 0–3)

## 2023-10-27 NOTE — Progress Notes (Signed)
 Patient seen in follow up. He has completed treatment, and post treatment scan showed CR. He does have some pain to the site of his known tumor. CT will be done to rule out recurrence.   Oncology Nurse Navigator Documentation     10/27/2023   11:15 AM  Oncology Nurse Navigator Flowsheets  Navigator Follow Up Date: 10/29/2023  Navigator Follow Up Reason: Scan Review  Navigator Location [facility]  Navigator Encounter Type Appt/Treatment Plan Review  Patient Visit Type MedOnc  Treatment Phase Post-Tx Follow-up  Barriers/Navigation Needs No Barriers At This Time  Interventions None Required  Acuity Level 1-No Barriers  Support Groups/Services Friends and Family  Time Spent with Patient 15

## 2023-10-28 ENCOUNTER — Other Ambulatory Visit: Payer: Self-pay

## 2023-10-29 ENCOUNTER — Other Ambulatory Visit: Payer: Self-pay

## 2023-10-29 ENCOUNTER — Ambulatory Visit (HOSPITAL_BASED_OUTPATIENT_CLINIC_OR_DEPARTMENT_OTHER)
Admission: RE | Admit: 2023-10-29 | Discharge: 2023-10-29 | Disposition: A | Discharge status: Home / Self Care | Source: Ambulatory Visit | Attending: Hematology & Oncology | Admitting: Hematology & Oncology

## 2023-10-29 DIAGNOSIS — C6292 Malignant neoplasm of left testis, unspecified whether descended or undescended: Secondary | ICD-10-CM | POA: Insufficient documentation

## 2023-10-29 MED ORDER — IOHEXOL 300 MG/ML  SOLN
100.0000 mL | Freq: Once | INTRAMUSCULAR | Status: AC | PRN
  Administered 2023-10-29: 100 mL via INTRAVENOUS

## 2023-10-30 ENCOUNTER — Other Ambulatory Visit: Payer: Self-pay | Admitting: Hematology & Oncology

## 2023-10-30 ENCOUNTER — Encounter: Payer: Self-pay | Admitting: *Deleted

## 2023-10-30 DIAGNOSIS — C7802 Secondary malignant neoplasm of left lung: Secondary | ICD-10-CM

## 2023-10-30 DIAGNOSIS — C6291 Malignant neoplasm of right testis, unspecified whether descended or undescended: Secondary | ICD-10-CM

## 2023-10-30 DIAGNOSIS — C6292 Malignant neoplasm of left testis, unspecified whether descended or undescended: Secondary | ICD-10-CM

## 2023-10-30 NOTE — Progress Notes (Signed)
 Myrlene Asper, DO  Wei P[place]i; P Ir Procedure Requests; Rosetta Cons, MD OK for CT guided biopsy of right pelvic lymph node/mass.  CT 5/15, image 111/153.    Daniel Shepherd       Previous Messages    ----- Message ----- From: Lecil Tapp Sent: 10/30/2023   3:48 PM EDT To: Rosetta Cons, MD; Joelle Musca; * Subject: CT abdominal mass biopsy                      Procedure : CT abdominal mass biopsy  Reason: ?? 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

## 2023-10-30 NOTE — Progress Notes (Signed)
 Reviewed patient's CT which is concerning for recurrence. Spoke with Dr Maria Shiner. Patient will need a biopsy and likely referral to [facility] for transplant consideration. Dr Maria Shiner would like to talk to patient prior to placing orders. Will await discussion and orders.   Oncology Nurse Navigator Documentation     10/30/2023    9:00 AM  Oncology Nurse Navigator Flowsheets  Navigator Follow Up Date: 11/02/2023  Navigator Follow Up Reason: Appointment Review  Navigator Location [facility]  Navigator Encounter Type Scan Review  Patient Visit Type MedOnc  Treatment Phase Post-Tx Follow-up  Barriers/Navigation Needs No Barriers At This Time  Interventions Coordination of Care  Acuity Level 1-No Barriers  Coordination of Care Other  Support Groups/Services Friends and Family  Time Spent with Patient 15

## 2023-11-02 ENCOUNTER — Encounter: Payer: Self-pay | Admitting: *Deleted

## 2023-11-02 NOTE — Progress Notes (Signed)
 Dr Maria Shiner spoke to patient and placed biopsy order. Scheduled for 11/23/2023.  Oncology Nurse Navigator Documentation     11/02/2023   10:00 AM  Oncology Nurse Navigator Flowsheets  Navigator Follow Up Date: 11/23/2023  Navigator Follow Up Reason: Surgery  Navigator Location [facility]  Navigator Encounter Type Appt/Treatment Plan Review  Patient Visit Type MedOnc  Treatment Phase Post-Tx Follow-up  Barriers/Navigation Needs No Barriers At This Time  Interventions Coordination of Care  Acuity Level 1-No Barriers  Support Groups/Services Friends and Family  Time Spent with Patient 15

## 2023-11-19 ENCOUNTER — Other Ambulatory Visit: Payer: Self-pay | Admitting: Diagnostic Radiology

## 2023-11-19 DIAGNOSIS — Z01818 Encounter for other preprocedural examination: Secondary | ICD-10-CM

## 2023-11-21 NOTE — H&P (Signed)
 Chief Complaint: Recurrent testicular cancer; soft tissue mass of right ureter; right pelvic lymph node/mass - image guided right pelvic lymph node mass biopsy  Referring Provider(s): Gray Layman  Supervising Physician: Elene Griffes  Patient Status: [facility] - Out-pt  History of Present Illness: Daniel Shepherd is a 28 y.o. male with history of left non-seminomatous testicular cancer, seminoma of the right testes, and new soft tissue mass of the right ureter concerning for metastasis.  Patient is known to us  for placement on 06/19/2023 with Dr. Mabel Savage.  Patient is followed by Dr. Maria Shiner of hematology oncology where CT chest abdomen pelvis was obtained 10/29/2023 revealing  new soft tissue mass along the course of the proximal mid right ureter which results in moderate right hydronephrosis concerning of metastatic disease.  Patient was referred to interventional radiology for possible image guided right right pelvic lymph node mass biopsy.  Imaging was reviewed and approved by Dr. Burna Carrier for image guided right pelvic lymph node mass biopsy.    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   Testicular cancer [facility])     Past Surgical History:  Procedure Laterality Date   HYDROCELE EXCISION Left 07/03/2017   Procedure: HYDROCELECTOMY ADULT/ INGUINAL APPROACH/ TESTICULAR BIOPSY/ ORCHIECTOMY;  Surgeon: Ottelin, Mark, MD;  Location: [facility] [place];  Service: Urology;  Laterality: Left;   IR FLUORO GUIDE PORT INSERTION RIGHT  07/09/2017   IR IMAGING GUIDED PORT INSERTION   06/19/2023   IR REMOVAL TUN ACCESS W/ PORT W/O FL MOD SED  10/12/2017   IR US  GUIDE VASC ACCESS RIGHT  07/09/2017   NO PAST SURGERIES     ORCHIECTOMY      Allergies: Bee venom and Other  Medications: Prior to Admission medications   Medication Sig Start Date End Date Taking? Authorizing Provider  apixaban  (ELIQUIS ) 2.5 MG TABS tablet Take 1 tablet (2.5 mg total) by mouth 2 (two) times daily. 09/15/23   Ivor Mars, MD  dexamethasone  (DECADRON ) 4 MG tablet Take 2 tablets (8 mg total) by mouth daily. Start the day after last cisplatin  dose on day 6 of chemo x 3 days.Take with food. 06/22/23   Ivor Mars, MD  HYDROcodone -acetaminophen  (NORCO) 5-325 MG tablet Take 1 tablet by mouth every 6 (six) hours as needed for moderate pain (pain score 4-6). 06/23/23   Ivor Mars, MD  lidocaine -prilocaine  (EMLA ) cream Apply to affected area once 06/22/23   Ivor Mars, MD  losartan  (COZAAR ) 25 MG tablet Take 12.5 mg by mouth daily. 06/30/23   [provider]  Multiple Vitamin (MULTIVITAMIN) tablet Take 1 tablet by mouth daily.    [provider]  OLANZapine  (ZYPREXA ) 10 MG tablet Take 1 tablet (10 mg total) by mouth at bedtime. 08/11/23   Ivor Mars, MD  ondansetron  (ZOFRAN ) 8 MG tablet Take 1 tablet (8 mg total) by mouth every 8 (eight) hours as needed for nausea or vomiting. 07/22/23   Ivor Mars, MD  prochlorperazine  (COMPAZINE ) 10 MG tablet Take 1 tablet (  10 mg total) by mouth every 6 (six) hours as needed for nausea or vomiting. 08/11/23   Ivor Mars, MD  testosterone cypionate (DEPOTESTOSTERONE CYPIONATE) 200 MG/ML injection Inject into the muscle every 7 (seven) days. 12/18/22   [provider]     Family History  Problem Relation Age of Onset   Healthy Mother    Healthy Father    Colon cancer Other        Lynch syndrome   Breast cancer Maternal Great-grandmother     Social History   Socioeconomic History   Marital status: Married    Spouse  name: Not on file   Number of children: Not on file   Years of education: Not on file   Highest education level: Not on file  Occupational History   Not on file  Tobacco Use   Smoking status: Never   Smokeless tobacco: Never  Vaping Use   Vaping status: Never Used  Substance and Sexual Activity   Alcohol use: Yes    Comment: seldom   Drug use: Yes    Types: Marijuana   Sexual activity: Yes  Other Topics Concern   Not on file  Social History Narrative   Not on file   Social Drivers of Health   Financial Resource Strain: Low Risk  (06/30/2023)   Received from [facility]   Overall Financial Resource Strain (CARDIA)    Difficulty of Paying Living Expenses: Not hard at all  Food Insecurity: No Food Insecurity (07/12/2023)   Hunger Vital Sign    Worried About Running Out of Food in the Last Year: Never true    Ran Out of Food in the Last Year: Never true  Transportation Needs: No Transportation Needs (07/12/2023)   PRAPARE - Administrator, Civil Service (Medical): No    Lack of Transportation (Non-Medical): No  Physical Activity: Not on file  Stress: Not on file  Social Connections: Unknown (10/25/2021)   Received from [facility]   Social Network    Social Network: Not on file     Review of Systems: A 12 point ROS discussed and pertinent positives are indicated in the HPI above.  All other systems are negative.  Review of Systems  Vital Signs: There were no vitals taken for this visit.  Advance Care Plan: The advanced care place/surrogate decision maker was discussed at the time of visit and the patient did not wish to discuss or was not able to name a surrogate decision maker or provide an advance care plan.  Physical Exam  Imaging: CT CHEST ABDOMEN PELVIS W CONTRAST Result Date: 10/29/2023 EXAMINATION: CT CHEST ABDOMEN PELVIS W CONTRAST CLINICAL INDICATION: Male, 28 years old. Testicular cancer, nonseminoma, assess treatment response; Evaluate for  any recurrent testicular cancer. Also evaluate for thrombus resolution in right side. TECHNIQUE: Axial CT of the chest, abdomen, and pelvis with 100 mL Omnipaque  300 intravenous contrast. Multiplanar reformations provided. Unless otherwise specified, incidental thyroid, adrenal, renal lesions do not require dedicated imaging follow up. Additionally, any mentioned pulmonary nodules do not require dedicated imaging follow-up based on the Fleischner guidelines unless otherwise specified. Coronary calcifications are not identified unless otherwise specified. COMPARISON: 09/02/2023 FINDINGS: CHEST: Right chest wall Mediport catheter tip terminates in the SVC. The visualized thyroid is normal. The thoracic aorta is normal in caliber. The main pulmonary artery is normal in caliber. The heart is normal in size. No free fluid or pathologic lymphadenopathy by size criteria. The trachea and mainstem bronchi  are patent. The lungs appear clear than a few micronodules again identified in the left lower lobe measuring up to 2 mm, which are likely benign. ABDOMEN/PELVIS: The liver appears normal. The gallbladder is normal. The spleen is normal. The pancreas is normal. The adrenals are normal. Left kidney is normal. There is new masslike soft tissue along the course of the proximal and the mid left ureter measuring 12 x 11 mm (series 301 image 95). This results in moderate right-sided hydronephrosis. The abdominal aorta is normal in caliber. The bladder is normal. The prostate is normal. There is continued interval decrease in size of the soft tissue just lateral to the right psoas muscle now measuring 4 mm (series 301 image 99). There is a new mass in the right lower quadrant of the peritoneum just anteromedial to the external iliac vasculature measuring 3.3 x 1.8 cm (series 301 image 111). The patient is status post bilateral orchiectomy. There is a right-sided testicular implant noted. BONES: No concerning osseous lesions are  identified. IMPRESSION: New soft tissue mass along the course of the proximal mid right ureter which results in moderate right hydronephrosis concerning for metastatic disease. New peritoneal mass in the right pelvic region also concerning for metastatic disease. Other metastatic focus lateral to the right psoas muscle continues to decrease in size. No convincing evidence for metastatic disease within the chest. DOSE REDUCTION: All CT scans are performed using radiation dose reduction techniques, when applicable. Technical factors are evaluated and adjusted to ensure appropriate moderation of exposure. Electronically signed by: [place] Engel MD 10/29/2023 03:24 PM EDT RP Workstation: [id number]    Labs:  CBC: Recent Labs    08/10/23 0830 08/25/23 0840 09/14/23 0950 10/26/23 0930  WBC 9.3 8.5 4.9 4.9  HGB 11.3* 9.7* 12.0* 13.7  HCT 33.4* 27.8* 34.7* 39.8  PLT 179 82* 222 232    COAGS: No results for input(s): "INR", "APTT" in the last 8760 hours.  BMP: Recent Labs    08/10/23 0830 08/25/23 0840 09/14/23 0950 10/26/23 0930  NA 139 137 140 137  K 4.0 4.0 3.9 4.0  CL 104 103 105 103  CO2 29 28 28 26   GLUCOSE 83 82 94 134*  BUN 17 15 16 16   CALCIUM 9.5 8.7* 9.2 9.3  CREATININE 1.01 0.84 0.95 1.32*  GFRNONAA >60 >60 >60 >60    LIVER FUNCTION TESTS: Recent Labs    08/10/23 0830 08/25/23 0840 09/14/23 0950 10/26/23 0930  BILITOT 0.3 0.3 0.4 0.5  AST 15 19 29 18   ALT 14 15 41 20  ALKPHOS 67 66 52 67  PROT 6.6 6.5 7.1 7.1  ALBUMIN 4.3 4.2 4.4 4.4    TUMOR MARKERS: No results for input(s): "AFPTM", "CEA", "CA199", "CHROMGRNA" in the last 8760 hours.  Assessment and Plan:  Pt is with recurrent testicular cancer, soft tissue mass of right ureter, right pelvic lymph node/mass scheduled for image guided right pelvic lymph node mass biopsy.  Imaging was reviewed and approved by Dr. Burna Carrier.  Risks and benefits of right pelvic lymph node mass biopsy was discussed with the  patient and/or patient's family including, but not limited to bleeding, infection, damage to adjacent structures or low yield requiring additional tests.  All of the questions were answered and there is agreement to proceed.  Consent signed and in chart.  Thank you for allowing our service to participate in Juanjesus Pepperman 's care.  Electronically Signed: Pasty Bongo, PA-C   11/21/2023, 8:56 AM    I  spent a total of    25 Minutes in face to face in clinical consultation, greater than 50% of which was counseling/coordinating care for image guided right pelvic lymph node mass biopsy

## 2023-11-23 ENCOUNTER — Other Ambulatory Visit: Payer: Self-pay

## 2023-11-23 ENCOUNTER — Ambulatory Visit (HOSPITAL_COMMUNITY)
Admission: RE | Admit: 2023-11-23 | Discharge: 2023-11-23 | Disposition: A | Discharge status: Home / Self Care | Source: Ambulatory Visit | Attending: Hematology & Oncology | Admitting: Hematology & Oncology

## 2023-11-23 ENCOUNTER — Encounter (HOSPITAL_COMMUNITY): Payer: Self-pay

## 2023-11-23 DIAGNOSIS — Z01818 Encounter for other preprocedural examination: Secondary | ICD-10-CM | POA: Diagnosis present

## 2023-11-23 DIAGNOSIS — R590 Localized enlarged lymph nodes: Secondary | ICD-10-CM | POA: Diagnosis not present

## 2023-11-23 DIAGNOSIS — N2889 Other specified disorders of kidney and ureter: Secondary | ICD-10-CM | POA: Diagnosis not present

## 2023-11-23 DIAGNOSIS — C7802 Secondary malignant neoplasm of left lung: Secondary | ICD-10-CM | POA: Diagnosis not present

## 2023-11-23 DIAGNOSIS — C6292 Malignant neoplasm of left testis, unspecified whether descended or undescended: Secondary | ICD-10-CM | POA: Diagnosis not present

## 2023-11-23 DIAGNOSIS — C6291 Malignant neoplasm of right testis, unspecified whether descended or undescended: Secondary | ICD-10-CM | POA: Insufficient documentation

## 2023-11-23 DIAGNOSIS — C801 Malignant (primary) neoplasm, unspecified: Secondary | ICD-10-CM

## 2023-11-23 LAB — CBC
HCT: 41.9 % (ref 39.0–52.0)
Hemoglobin: 14.3 g/dL (ref 13.0–17.0)
MCH: 31.3 pg (ref 26.0–34.0)
MCHC: 34.1 g/dL (ref 30.0–36.0)
MCV: 91.7 fL (ref 80.0–100.0)
Platelets: 255 10*3/uL (ref 150–400)
RBC: 4.57 MIL/uL (ref 4.22–5.81)
RDW: 10.7 % — ABNORMAL LOW (ref 11.5–15.5)
WBC: 4.1 10*3/uL (ref 4.0–10.5)
nRBC: 0 % (ref 0.0–0.2)

## 2023-11-23 LAB — PROTIME-INR
INR: 1.1 (ref 0.8–1.2)
Prothrombin Time: 14.5 s (ref 11.4–15.2)

## 2023-11-23 MED ORDER — MIDAZOLAM HCL 2 MG/2ML IJ SOLN
INTRAMUSCULAR | Status: AC | PRN
  Administered 2023-11-23 (×2): .5 mg via INTRAVENOUS
  Administered 2023-11-23: 1 mg via INTRAVENOUS

## 2023-11-23 MED ORDER — MIDAZOLAM HCL 2 MG/2ML IJ SOLN
INTRAMUSCULAR | Status: AC
  Filled 2023-11-23: qty 4

## 2023-11-23 MED ORDER — HYDROCODONE-ACETAMINOPHEN 5-325 MG PO TABS: 1.0000 | ORAL_TABLET | ORAL | Status: DC | PRN

## 2023-11-23 MED ORDER — FENTANYL CITRATE (PF) 100 MCG/2ML IJ SOLN
INTRAMUSCULAR | Status: AC | PRN
  Administered 2023-11-23 (×2): 50 ug via INTRAVENOUS

## 2023-11-23 MED ORDER — FENTANYL CITRATE (PF) 100 MCG/2ML IJ SOLN
INTRAMUSCULAR | Status: AC
  Filled 2023-11-23: qty 4

## 2023-11-23 MED ORDER — LIDOCAINE HCL 1 % IJ SOLN
10.0000 mL | Freq: Once | INTRAMUSCULAR | Status: AC
  Administered 2023-11-23: 10 mL via INTRADERMAL

## 2023-11-23 NOTE — Procedures (Signed)
 Interventional Radiology Procedure:   Indications: Testicular cancer and enlarged right pelvic lymph node  Procedure: CT guided core biopsy of enlarged right pelvic lymph node  Findings: 4 core biopsies from right pelvic lymph node  Complications: None     EBL: Minimal  Plan: Discharge in 1 hour.  May resume Eliquis  tomorrow (6/10).   Cloe Sockwell R. Julietta Ogren, MD  Pager: [phone]

## 2023-11-25 ENCOUNTER — Other Ambulatory Visit: Payer: Self-pay | Admitting: Urology

## 2023-11-26 ENCOUNTER — Encounter: Payer: Self-pay | Admitting: *Deleted

## 2023-11-26 LAB — SURGICAL PATHOLOGY

## 2023-11-26 NOTE — Progress Notes (Signed)
 Reviewed pathology with Dr Maria Shiner. LN shows mostly necrotic tumor, but there are scant areas of tumor consistent with patient's known malignancy.   At this time there are no further path needs or treatment orders.   Oncology Nurse Navigator Documentation     11/26/2023   11:45 AM  Oncology Nurse Navigator Flowsheets  Navigator Follow Up Date: 12/07/2023  Navigator Follow Up Reason: Follow-up After Biopsy  Navigator Location [facility]  Navigator Encounter Type Pathology Review  Patient Visit Type MedOnc  Treatment Phase Post-Tx Follow-up  Barriers/Navigation Needs No Barriers At This Time  Interventions Coordination of Care  Acuity Level 1-No Barriers  Coordination of Care Other  Support Groups/Services Friends and Family  Time Spent with Patient 15

## 2023-11-27 ENCOUNTER — Other Ambulatory Visit: Payer: Self-pay

## 2023-12-07 ENCOUNTER — Inpatient Hospital Stay

## 2023-12-07 ENCOUNTER — Inpatient Hospital Stay: Admitting: Hematology & Oncology

## 2023-12-07 ENCOUNTER — Inpatient Hospital Stay: Attending: Hematology & Oncology

## 2023-12-07 DIAGNOSIS — C6291 Malignant neoplasm of right testis, unspecified whether descended or undescended: Secondary | ICD-10-CM | POA: Insufficient documentation

## 2023-12-07 DIAGNOSIS — C7800 Secondary malignant neoplasm of unspecified lung: Secondary | ICD-10-CM | POA: Insufficient documentation

## 2023-12-09 ENCOUNTER — Encounter: Payer: Self-pay | Admitting: *Deleted

## 2023-12-09 ENCOUNTER — Encounter: Payer: Self-pay | Admitting: Hematology & Oncology

## 2023-12-09 ENCOUNTER — Inpatient Hospital Stay (HOSPITAL_BASED_OUTPATIENT_CLINIC_OR_DEPARTMENT_OTHER): Admitting: Hematology & Oncology

## 2023-12-09 VITALS — BP 115/81 | HR 104 | Temp 98.6°F | Resp 18 | Ht 73.0 in | Wt 203.9 lb

## 2023-12-09 DIAGNOSIS — C801 Malignant (primary) neoplasm, unspecified: Secondary | ICD-10-CM | POA: Diagnosis not present

## 2023-12-09 DIAGNOSIS — C7802 Secondary malignant neoplasm of left lung: Secondary | ICD-10-CM | POA: Diagnosis not present

## 2023-12-09 DIAGNOSIS — C6291 Malignant neoplasm of right testis, unspecified whether descended or undescended: Secondary | ICD-10-CM

## 2023-12-09 DIAGNOSIS — C7800 Secondary malignant neoplasm of unspecified lung: Secondary | ICD-10-CM | POA: Diagnosis not present

## 2023-12-09 NOTE — Progress Notes (Signed)
 Patient was seen by Dr Darra at [facility] yesterday for consideration of tandem transplant. Plan per their notes:  As such, agree with plan for ureteral stenting given rising creatinine so as to facilitate renal recovery, and recommend initiation of paclitaxel, ifosfamide , and cisplatin  (TIP) chemotherapy with radiographic reassessment of disease response after the first cycle. If his disease is responding to TIP, will initiate the transplant preparation process. If not yet responding, additional cycles of TIP will be recommended. Aiming for as much tumor debulking prior to transplant as possible, hence the rationale for additional cycles if the disease does not demonstrate a significant response to the first cycle of TIP.   Spoke with Dr Timmy about plan for TIP at this location. Dr Timmy would like to see the patient this afternoon. Called and spoke to the patient's wife. They will come in this afternoon for discussion.   Oncology Nurse Navigator Documentation     12/09/2023    8:00 AM  Oncology Nurse Navigator Flowsheets  Navigator Follow Up Date: 12/09/2023  Navigator Follow Up Reason: Follow-up Appointment  Navigator Location [facility]  Navigator Encounter Type Appt/Treatment Plan Review;Telephone  Telephone Outgoing Call  Patient Visit Type MedOnc  Treatment Phase Pre-Tx/Tx Discussion  Barriers/Navigation Needs No Barriers At This Time  Interventions Coordination of Care;Education  Acuity Level 1-No Barriers  Coordination of Care Appts  Education Method Verbal  Support Groups/Services Friends and Family  Time Spent with Patient 30

## 2023-12-09 NOTE — Progress Notes (Signed)
 Yes think tomorrow the hospital saw song back in yesterday really really Hematology and Oncology Follow Up Visit  Daniel Shepherd [id number] June 29, 1995 28 y.o. 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 twice daily -started on 07/13/2023 TIP -- start cycle #1 on 12/21/2023     Interim History:  Daniel Shepherd is  back for follow-up.  Unfortunately, we have documented yet another recurrence of his seminoma.  When I had seen him back in May, he has been having some abdominal pain in the right lower quadrant.  We did do a CT scan.  The CT scan did show that he had a new mass that measured 3.3 x 1.8 cm.  This is in the right lower quadrant just anterior medial to the external iliac vein.  We subsequently got a biopsy.  This was done on 11/23/2023.  The pathology report ( [id number]) showed metastatic germ cell tumor.  His last tumor markers were normal with a beta hCG less than 1 and alpha-fetoprotein of 4.3.  I got him out to [facility].  I had him evaluated for a stem cell transplant.  They thought that he would be a candidate but wanted him to have chemotherapy to see if his disease was chemosensitive.  He still having pain in the right lower quadrant.  He really does not take anything for it.  He would like to try to avoid having to take medication.  He is seeing urology because of hydronephrosis of his right kidney.  He was scheduled to have a stent placed on 12/25/2023.  We really need to move this up to have this done next week so we get him started on chemotherapy.  I think that the program for him that we  would try would be Taxol/ifosfamide /cisplatin  him (TIP).  We have used this in the past as salvage.  This has had a good success rate.  I just hate that he has had another recurrence.  I really cannot tell him as to why he has a recurrence.  Clearly, there is something about his tumor biology that is causing this.  I told him that the point of a stem cell transplant was to give him high doses of chemotherapy to overcome resistance.  He feels well otherwise.  He is still trying to work.  He has had no cough.  He has had no nausea or vomiting.  He does have a little bit of dysuria.  He has had a little bit of constipation.  He has had no leg swelling.  He is on low-dose Eliquis  for the right gonadal vein thrombus.  Overall, his performance status is probably ECOG 0.     Medications:  Current Outpatient Medications:    apixaban  (ELIQUIS ) 2.5 MG TABS tablet, Take 1 tablet (2.5 mg total) by mouth 2 (two) times daily., Disp: 60 tablet, Rfl: 6   losartan  (COZAAR ) 25 MG tablet, Take 12.5 mg by mouth daily., Disp: , Rfl:    Multiple Vitamin (MULTIVITAMIN) tablet, Take 1 tablet by mouth in the morning., Disp: , Rfl:    OLANZapine  (ZYPREXA ) 10 MG tablet, Take 1 tablet (10 mg total)  by mouth at bedtime., Disp: 30 tablet, Rfl: 0   testosterone cypionate (DEPOTESTOSTERONE CYPIONATE) 200 MG/ML injection, Inject 100 mg into the muscle every Sunday., Disp: , Rfl:  No current facility-administered medications for this visit.  Facility-Administered Medications Ordered in Other Visits:    pegfilgrastim  (NEULASTA  ONPRO KIT) injection 6 mg, 6 mg, Subcutaneous, Once, Franchot Lauraine HERO, NP  Allergies:  Allergies  Allergen Reactions   Bee Venom Anaphylaxis   Other Anaphylaxis and Swelling    Bee allergy    Past Medical History, Surgical history, Social history, and Family History were reviewed and updated.  Review of Systems: Review of Systems  Constitutional:  Positive for fatigue.  HENT:  Negative.     Eyes: Negative.   Respiratory:  Positive for shortness of breath.   Cardiovascular: Negative.   Gastrointestinal:  Positive for nausea.  Endocrine: Negative.   Genitourinary: Negative.    Musculoskeletal: Negative.   Skin: Negative.   Neurological: Negative.   Hematological: Negative.   Psychiatric/Behavioral: Negative.      Physical Exam:  height is 6' 1 (1.854 m) and weight is 203 lb 14.4 oz (92.5 kg). His oral temperature is 98.6 F (37 C). His blood pressure is 115/81 and his pulse is 104 (abnormal). His respiration is 18 and oxygen saturation is 96%.   Wt Readings from Last 3 Encounters:  12/09/23 203 lb 14.4 oz (92.5 kg)  11/23/23 220 lb (99.8 kg)  10/26/23 217 lb (98.4 kg)    Physical Exam Vitals reviewed.  HENT:     Head: Normocephalic and atraumatic.   Eyes:     Pupils: Pupils are equal, round, and reactive to light.    Cardiovascular:     Rate and Rhythm: Normal rate and regular rhythm.     Heart sounds: Normal heart sounds.  Pulmonary:     Effort: Pulmonary effort is normal.     Breath sounds: Normal breath sounds.  Abdominal:     General: Bowel sounds are normal.     Palpations: Abdomen is soft.     Comments: Has a well-healed left inguinal orchiectomy scar.  There is no swelling.  There is no erythema.  There is no tenderness.   Musculoskeletal:        General: No tenderness or deformity. Normal range of motion.     Cervical back: Normal range of motion.  Lymphadenopathy:     Cervical: No cervical adenopathy.   Skin:    General: Skin is warm and dry.     Findings: No erythema or rash.   Neurological:     Mental Status: He is alert and oriented to person, place, and time.   Psychiatric:        Behavior: Behavior normal.        Thought Content: Thought content normal.        Judgment: Judgment normal.      Lab Results  Component Value Date   WBC 4.1 11/23/2023   HGB 14.3 11/23/2023   HCT 41.9 11/23/2023   MCV 91.7 11/23/2023   PLT  255 11/23/2023     Chemistry      Component Value Date/Time   NA 137 10/26/2023 0930   NA 141 10/29/2022 0931   K 4.0 10/26/2023 0930   CL 103 10/26/2023 0930   CO2 26 10/26/2023 0930   BUN 16 10/26/2023 0930   BUN 18 10/29/2022 0931   CREATININE 1.32 (H) 10/26/2023 0930      Component Value Date/Time   CALCIUM  9.3 10/26/2023 0930   ALKPHOS 67 10/26/2023 0930   AST 18 10/26/2023 0930   ALT 20 10/26/2023 0930   BILITOT 0.5 10/26/2023 0930      Impression and Plan: Mr. Soderman is a 28 year old white male with metastatic embryonal cell carcinoma.  He had a left orchiectomy.  This was performed back on July 03, 2017.  A week later, he showed up in the emergency room with chest discomfort and some shortness of breath.  He had innumerable pulmonary nodules.  He was treated with systemic chemotherapy.  He had a complete response.  He then developed a second testicular cancer in the right testicle.  This was a pure seminoma.  It was early-stage.  The seminoma then relapsed.  He had 3 cycles of chemotherapy with BEP.  He had an initial response and then relapsed again.  Again, we are going to have to treat him in the salvage setting.  Again I think that the TIP protocol would be reasonable.  I think he could tolerate this.  I know this will not be easy.  However, I think that we really have to push hard given his young age, excellent performance status and the fact that we have a stem cell transplant that we could consider.  We will try to get treatment started the week of the seventh.  We will go ahead and scan him after his first cycle of treatment.  Hopefully, we will see that he responds.  Again I am not sure if his tumor markers will be able to help us  out.  He certainly has a great attitude.  He has great support from his family.  Again this clearly is a biologically unique malignancy that keeps finding a way to come back.  We will see him when he starts treatment.  Again I will  make sure that we try to get Urology to put a stent in before he starts his treatment.      Maude JONELLE Crease, MD 6/25/20255:38 PM

## 2023-12-09 NOTE — Progress Notes (Signed)
 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

## 2023-12-10 ENCOUNTER — Encounter: Payer: Self-pay | Admitting: Hematology & Oncology

## 2023-12-10 ENCOUNTER — Encounter: Payer: Self-pay | Admitting: *Deleted

## 2023-12-10 NOTE — Progress Notes (Signed)
 Patient was seen late yesterday and plan for TIP made. Dr Timmy spoke with Dr Alvaro today and his cystoscopy will be moved to next week. New regimen will start 12/21/2023. He will need a CT after completion of cycle one.   Oncology Nurse Navigator Documentation     12/10/2023   11:45 AM  Oncology Nurse Navigator Flowsheets  Diagnosis Status Recurrent  Planned Course of Treatment Chemotherapy  Phase of Treatment Chemotherapy  Chemotherapy Pending- Reason: Oncologist Choice  Navigator Follow Up Date: 12/21/2023  Navigator Follow Up Reason: Chemotherapy  Navigator Location [facility]  Navigator Encounter Type Appt/Treatment Plan Review  Patient Visit Type MedOnc  Treatment Phase Pre-Tx/Tx Discussion  Barriers/Navigation Needs No Barriers At This Time  Interventions None Required  Acuity Level 1-No Barriers  Support Groups/Services Friends and Family  Time Spent with Patient 15

## 2023-12-11 ENCOUNTER — Other Ambulatory Visit: Payer: Self-pay

## 2023-12-11 NOTE — Progress Notes (Signed)
 COVID Vaccine Completed:  Date of COVID positive in last 90 days:  PCP -  Cardiologist -  Oncologist- Maude Crease, MD  Chest x-ray - 09/14/23 Epic EKG -  Stress Test -  ECHO -  Cardiac Cath -  Pacemaker/ICD device last checked: Spinal Cord Stimulator:  Bowel Prep -   Sleep Study -  CPAP -   Fasting Blood Sugar -  Checks Blood Sugar _____ times a day  Last dose of GLP1 agonist-  N/A GLP1 instructions:  Do not take after     Last dose of SGLT-2 inhibitors-  N/A SGLT-2 instructions:  Do not take after     Blood Thinner Instructions:  Eliquis  Aspirin Instructions: Last Dose:  Activity level:  Can go up a flight of stairs and perform activities of daily living without stopping and without symptoms of chest pain or shortness of breath.  Able to exercise without symptoms  Unable to go up a flight of stairs without symptoms of     Anesthesia review: thrombus, 1st degree AV block, CP, metastatic embryonal carcinoma  Patient denies shortness of breath, fever, cough and chest pain at PAT appointment  Patient verbalized understanding of instructions that were given to them at the PAT appointment. Patient was also instructed that they will need to review over the PAT instructions again at home before surgery.

## 2023-12-11 NOTE — Progress Notes (Signed)
 Please place orders for PAT appointment scheduled 12/14/23.

## 2023-12-11 NOTE — Patient Instructions (Signed)
 SURGICAL WAITING ROOM VISITATION  Patients having surgery or a procedure may have no more than 2 support people in the waiting area - these visitors may rotate.    Children under the age of 28 must have an adult with them who is not the patient.  Visitors with respiratory illnesses are discouraged from visiting and should remain at home.  If the patient needs to stay at the hospital during part of their recovery, the visitor guidelines for inpatient rooms apply. Pre-op nurse will coordinate an appropriate time for 1 support person to accompany patient in pre-op.  This support person may not rotate.    Please refer to the [facility] website for the visitor guidelines for Inpatients (after your surgery is over and you are in a regular room).    Your procedure is scheduled on: 12/25/23   Report to [facility] Main Entrance    Report to admitting at 12:15 PM   Call this number if you have problems the morning of surgery [phone]   Do not eat food :After Midnight.   After Midnight you may have the following liquids until ______ AM/ PM DAY OF SURGERY  Water Non-Citrus Juices (without pulp, NO RED-Apple, White grape, White cranberry) Black Coffee (NO MILK/CREAM OR CREAMERS, sugar ok)  Clear Tea (NO MILK/CREAM OR CREAMERS, sugar ok) regular and decaf                             Plain Jell-O (NO RED)                                           Fruit ices (not with fruit pulp, NO RED)                                     Popsicles (NO RED)                                                               Sports drinks like Gatorade (NO RED)              Drink 2 Ensure/G2 drinks AT 10:00 PM the night before surgery.        The day of surgery:  Drink ONE (1) Pre-Surgery Clear Ensure or G2 at AM the morning of surgery. Drink in one sitting. Do not sip.  This drink was given to you during your hospital  pre-op appointment visit. Nothing else to drink after completing the   Pre-Surgery Clear Ensure or G2.          If you have questions, please contact your surgeon's office.   FOLLOW BOWEL PREP AND ANY ADDITIONAL PRE OP INSTRUCTIONS YOU RECEIVED FROM YOUR SURGEON'S OFFICE!!!     Oral Hygiene is also important to reduce your risk of infection.                                    Remember - BRUSH YOUR TEETH THE MORNING OF SURGERY  WITH YOUR REGULAR TOOTHPASTE  DENTURES WILL BE REMOVED PRIOR TO SURGERY PLEASE DO NOT APPLY Poly grip OR ADHESIVES!!!   Do NOT smoke after Midnight   Stop all vitamins and herbal supplements 7 days before surgery.   Last dose of Eliquis     Take these medicines the morning of surgery with A SIP OF WATER: None  DO NOT TAKE ANY ORAL DIABETIC MEDICATIONS DAY OF YOUR SURGERY  Bring CPAP mask and tubing day of surgery.                              You may not have any metal on your body including jewelry, and body piercing             Do not wear lotions, powders, cologne, or deodorant              Men may shave face and neck.   Do not bring valuables to the hospital. [facility] IS NOT             RESPONSIBLE   FOR VALUABLES.   Contacts, glasses, dentures or bridgework may not be worn into surgery.  DO NOT BRING YOUR HOME MEDICATIONS TO THE HOSPITAL. PHARMACY WILL DISPENSE MEDICATIONS LISTED ON YOUR MEDICATION LIST TO YOU DURING YOUR ADMISSION IN THE HOSPITAL!    Patients discharged on the day of surgery will not be allowed to drive home.  Someone NEEDS to stay with you for the first 24 hours after anesthesia.   Special Instructions: Bring a copy of your healthcare power of attorney and living will documents the day of surgery if you haven't scanned them before.              Please read over the following fact sheets you were given: IF YOU HAVE QUESTIONS ABOUT YOUR PRE-OP INSTRUCTIONS PLEASE CALL [phone]   If you received a COVID test during your pre-op visit  it is requested that you wear a mask when out  in public, stay away from anyone that may not be feeling well and notify your surgeon if you develop symptoms. If you test positive for Covid or have been in contact with anyone that has tested positive in the last 10 days please notify you surgeon.    [facility] - Preparing for Surgery Before surgery, you can play an important role.  Because skin is not sterile, your skin needs to be as free of germs as possible.  You can reduce the number of germs on your skin by washing with CHG (chlorahexidine gluconate) soap before surgery.  CHG is an antiseptic cleaner which kills germs and bonds with the skin to continue killing germs even after washing. Please DO NOT use if you have an allergy to CHG or antibacterial soaps.  If your skin becomes reddened/irritated stop using the CHG and inform your nurse when you arrive at Short Stay. Do not shave (including legs and underarms) for at least 48 hours prior to the first CHG shower.  You may shave your face/neck.  Please follow these instructions carefully:  1.  Shower with CHG Soap the night before surgery and the  morning of surgery.  2.  If you choose to wash your hair, wash your hair first as usual with your normal  shampoo.  3.  After you shampoo, rinse your hair and body thoroughly to remove the shampoo.  4.  Use CHG as you would any other liquid soap.  You can apply chg directly to the skin and wash.  Gently with a scrungie or clean washcloth.  5.  Apply the CHG Soap to your body ONLY FROM THE NECK DOWN.   Do   not use on face/ open                           Wound or open sores. Avoid contact with eyes, ears mouth and   genitals (private parts).                       Wash face,  Genitals (private parts) with your normal soap.             6.  Wash thoroughly, paying special attention to the area where your    surgery  will be performed.  7.  Thoroughly rinse your body with warm water from the neck down.  8.  DO NOT shower/wash  with your normal soap after using and rinsing off the CHG Soap.                9.  Pat yourself dry with a clean towel.            10.  Wear clean pajamas.            11.  Place clean sheets on your bed the night of your first shower and do not  sleep with pets. Day of Surgery : Do not apply any lotions/deodorants the morning of surgery.  Please wear clean clothes to the hospital/surgery center.  FAILURE TO FOLLOW THESE INSTRUCTIONS MAY RESULT IN THE CANCELLATION OF YOUR SURGERY  PATIENT SIGNATURE_________________________________  NURSE SIGNATURE__________________________________  ________________________________________________________________________

## 2023-12-14 ENCOUNTER — Encounter (HOSPITAL_COMMUNITY)
Admission: RE | Admit: 2023-12-14 | Discharge: 2023-12-14 | Disposition: A | Discharge status: Home / Self Care | Source: Ambulatory Visit

## 2023-12-14 ENCOUNTER — Other Ambulatory Visit: Payer: Self-pay | Admitting: Hematology & Oncology

## 2023-12-14 DIAGNOSIS — C6291 Malignant neoplasm of right testis, unspecified whether descended or undescended: Secondary | ICD-10-CM

## 2023-12-14 NOTE — Progress Notes (Signed)
 Called patient regarding his PAT appointment at 0900. Patient stated he was told his appointment was cancelled because his surgery got moved up and there was something with insurance. Requested to call patient right back after discussing with charge RN and scheduler. Call patient back about 5 mins later and he did not pickup. Left voicemail stating we need to go over medical history and instructions. Called x3 more time throughout  the day with no answer or callback.

## 2023-12-15 ENCOUNTER — Encounter: Payer: Self-pay | Admitting: Hematology & Oncology

## 2023-12-15 ENCOUNTER — Other Ambulatory Visit: Payer: Self-pay

## 2023-12-15 ENCOUNTER — Encounter (HOSPITAL_COMMUNITY): Payer: Self-pay | Admitting: Urology

## 2023-12-15 NOTE — Progress Notes (Signed)
 PCP - Jason Damien MATSU, PA [facility] last office visit note 06/30/23 in [facility] Cardiologist - N/A Oncologist- Maude Crease, MD   Chest x-ray - 09/14/23 Epic EKG - N/A Stress Test - N/A ECHO - N/A Cardiac Cath - N/A Pacemaker/ICD device last checked: N/A Spinal Cord Stimulator: N/A   Bowel Prep - N/A   Sleep Study - N/A CPAP - N/A   Fasting Blood Sugar - N/A Checks Blood Sugar __N/A___ times a day   Last dose of GLP1 agonist-  N/A GLP1 instructions:  N/A   Last dose of SGLT-2 inhibitors-  N/A SGLT-2 instructions:  N/A      Blood Thinner Instructions:  Eliquis  Aspirin Instructions: N/A Last Dose: 12/12/23   Activity level:  Able to exercise without symptoms                      Anesthesia review: thrombus, 1st degree AV block, CP, metastatic embryonal carcinoma   Patient denies shortness of breath, fever, cough and chest pain at PAT appointment   Patient verbalized understanding of instructions.

## 2023-12-15 NOTE — Progress Notes (Signed)
 Attempted to obtain medical history via telephone, unable to reach at this time. Voicemail box is full unable to leave a voice message to call us  back.

## 2023-12-16 ENCOUNTER — Encounter (HOSPITAL_COMMUNITY): Payer: Self-pay | Admitting: Anesthesiology

## 2023-12-16 ENCOUNTER — Ambulatory Visit (HOSPITAL_BASED_OUTPATIENT_CLINIC_OR_DEPARTMENT_OTHER): Payer: Self-pay | Admitting: Anesthesiology

## 2023-12-16 ENCOUNTER — Ambulatory Visit (HOSPITAL_COMMUNITY)
Admission: RE | Admit: 2023-12-16 | Discharge: 2023-12-16 | Disposition: A | Discharge status: Home / Self Care | Attending: Urology | Admitting: Urology

## 2023-12-16 ENCOUNTER — Ambulatory Visit (HOSPITAL_COMMUNITY)

## 2023-12-16 ENCOUNTER — Encounter: Payer: Self-pay | Admitting: Hematology & Oncology

## 2023-12-16 ENCOUNTER — Encounter (HOSPITAL_COMMUNITY): Payer: Self-pay | Admitting: Urology

## 2023-12-16 ENCOUNTER — Encounter (HOSPITAL_COMMUNITY)
Admission: RE | Disposition: A | Discharge status: Home / Self Care | Payer: Self-pay | Source: Home / Self Care | Attending: Urology

## 2023-12-16 DIAGNOSIS — I10 Essential (primary) hypertension: Secondary | ICD-10-CM | POA: Insufficient documentation

## 2023-12-16 DIAGNOSIS — C7800 Secondary malignant neoplasm of unspecified lung: Secondary | ICD-10-CM | POA: Insufficient documentation

## 2023-12-16 DIAGNOSIS — I440 Atrioventricular block, first degree: Secondary | ICD-10-CM

## 2023-12-16 DIAGNOSIS — Z9079 Acquired absence of other genital organ(s): Secondary | ICD-10-CM | POA: Diagnosis not present

## 2023-12-16 DIAGNOSIS — C661 Malignant neoplasm of right ureter: Secondary | ICD-10-CM | POA: Diagnosis present

## 2023-12-16 DIAGNOSIS — N1339 Other hydronephrosis: Secondary | ICD-10-CM | POA: Diagnosis not present

## 2023-12-16 DIAGNOSIS — Z8547 Personal history of malignant neoplasm of testis: Secondary | ICD-10-CM | POA: Diagnosis not present

## 2023-12-16 DIAGNOSIS — N2889 Other specified disorders of kidney and ureter: Secondary | ICD-10-CM

## 2023-12-16 DIAGNOSIS — Z7901 Long term (current) use of anticoagulants: Secondary | ICD-10-CM | POA: Diagnosis not present

## 2023-12-16 DIAGNOSIS — Z86718 Personal history of other venous thrombosis and embolism: Secondary | ICD-10-CM | POA: Diagnosis not present

## 2023-12-16 HISTORY — DX: Anemia, unspecified: D64.9

## 2023-12-16 HISTORY — DX: Presence of other vascular implants and grafts: Z95.828

## 2023-12-16 HISTORY — DX: Cardiac murmur, unspecified: R01.1

## 2023-12-16 HISTORY — DX: Acute embolism and thrombosis of unspecified deep veins of unspecified lower extremity: I82.409

## 2023-12-16 HISTORY — DX: Malignant (primary) neoplasm, unspecified: C80.1

## 2023-12-16 HISTORY — PX: CYSTOSCOPY WITH BIOPSY: SHX5122

## 2023-12-16 HISTORY — PX: CYSTOSCOPY/URETEROSCOPY/HOLMIUM LASER/STENT PLACEMENT: SHX6546

## 2023-12-16 HISTORY — PX: CYSTOSCOPY W/ RETROGRADES: SHX1426

## 2023-12-16 SURGERY — CYSTOSCOPY/URETEROSCOPY/HOLMIUM LASER/STENT PLACEMENT
Anesthesia: General | Laterality: Right

## 2023-12-16 MED ORDER — GLYCOPYRROLATE 0.2 MG/ML IJ SOLN
INTRAMUSCULAR | Status: DC | PRN
  Administered 2023-12-16: .2 mg via INTRAVENOUS

## 2023-12-16 MED ORDER — SENNOSIDES-DOCUSATE SODIUM 8.6-50 MG PO TABS
1.0000 | ORAL_TABLET | Freq: Two times a day (BID) | ORAL | 0 refills | Status: AC
Start: 2023-12-16 — End: ?

## 2023-12-16 MED ORDER — ONDANSETRON HCL 4 MG/2ML IJ SOLN
INTRAMUSCULAR | Status: DC | PRN
  Administered 2023-12-16: 4 mg via INTRAVENOUS

## 2023-12-16 MED ORDER — AMISULPRIDE (ANTIEMETIC) 5 MG/2ML IV SOLN: 10.0000 mg | Freq: Once | INTRAVENOUS | Status: DC | PRN

## 2023-12-16 MED ORDER — PROPOFOL 10 MG/ML IV BOLUS
INTRAVENOUS | Status: DC | PRN
  Administered 2023-12-16: 200 mg via INTRAVENOUS

## 2023-12-16 MED ORDER — PROPOFOL 10 MG/ML IV BOLUS
INTRAVENOUS | Status: AC
Start: 2023-12-16 — End: 2023-12-16
  Filled 2023-12-16: qty 20

## 2023-12-16 MED ORDER — LIDOCAINE HCL (PF) 2 % IJ SOLN
INTRAMUSCULAR | Status: DC | PRN
  Administered 2023-12-16: 60 mg via INTRADERMAL

## 2023-12-16 MED ORDER — FENTANYL CITRATE (PF) 250 MCG/5ML IJ SOLN
INTRAMUSCULAR | Status: DC | PRN
  Administered 2023-12-16 (×2): 25 ug via INTRAVENOUS
  Administered 2023-12-16: 50 ug via INTRAVENOUS

## 2023-12-16 MED ORDER — CHLORHEXIDINE GLUCONATE 0.12 % MT SOLN
15.0000 mL | Freq: Once | OROMUCOSAL | Status: AC
  Administered 2023-12-16: 15 mL via OROMUCOSAL

## 2023-12-16 MED ORDER — OXYCODONE HCL 5 MG PO TABS: 5.0000 mg | ORAL_TABLET | Freq: Once | ORAL | Status: DC | PRN

## 2023-12-16 MED ORDER — FENTANYL CITRATE (PF) 100 MCG/2ML IJ SOLN
INTRAMUSCULAR | Status: AC
Start: 2023-12-16 — End: 2023-12-16
  Filled 2023-12-16: qty 2

## 2023-12-16 MED ORDER — MIDAZOLAM HCL 2 MG/2ML IJ SOLN
INTRAMUSCULAR | Status: AC
  Filled 2023-12-16: qty 2

## 2023-12-16 MED ORDER — OXYCODONE-ACETAMINOPHEN 5-325 MG PO TABS: 1.0000 | ORAL_TABLET | Freq: Four times a day (QID) | ORAL | 0 refills | Status: DC | PRN

## 2023-12-16 MED ORDER — DEXAMETHASONE SODIUM PHOSPHATE 10 MG/ML IJ SOLN
INTRAMUSCULAR | Status: DC | PRN
  Administered 2023-12-16: 10 mg via INTRAVENOUS

## 2023-12-16 MED ORDER — ACETAMINOPHEN 500 MG PO TABS
1000.0000 mg | ORAL_TABLET | Freq: Once | ORAL | Status: AC
  Administered 2023-12-16: 1000 mg via ORAL
  Filled 2023-12-16: qty 2

## 2023-12-16 MED ORDER — SODIUM CHLORIDE 0.9 % IR SOLN
Status: DC | PRN
  Administered 2023-12-16: 3000 mL

## 2023-12-16 MED ORDER — CEFAZOLIN SODIUM-DEXTROSE 2-3 GM-%(50ML) IV SOLR
INTRAVENOUS | Status: DC | PRN
  Administered 2023-12-16: 2 g via INTRAVENOUS

## 2023-12-16 MED ORDER — DEXMEDETOMIDINE HCL IN NACL 80 MCG/20ML IV SOLN
INTRAVENOUS | Status: DC | PRN
  Administered 2023-12-16: 12 ug via INTRAVENOUS

## 2023-12-16 MED ORDER — ORAL CARE MOUTH RINSE: 15.0000 mL | Freq: Once | OROMUCOSAL | Status: AC

## 2023-12-16 MED ORDER — OXYCODONE HCL 5 MG/5ML PO SOLN: 5.0000 mg | Freq: Once | ORAL | Status: DC | PRN

## 2023-12-16 MED ORDER — IOHEXOL 300 MG/ML  SOLN
INTRAMUSCULAR | Status: DC | PRN
  Administered 2023-12-16: 45 mL

## 2023-12-16 MED ORDER — MIDAZOLAM HCL 2 MG/2ML IJ SOLN
INTRAMUSCULAR | Status: DC | PRN
  Administered 2023-12-16: 2 mg via INTRAVENOUS

## 2023-12-16 MED ORDER — LACTATED RINGERS IV SOLN: INTRAVENOUS | Status: DC

## 2023-12-16 MED ORDER — FENTANYL CITRATE PF 50 MCG/ML IJ SOSY: 25.0000 ug | PREFILLED_SYRINGE | INTRAMUSCULAR | Status: DC | PRN

## 2023-12-16 SURGICAL SUPPLY — 25 items
BAG URINE DRAIN 2000ML AR STRL (UROLOGICAL SUPPLIES) IMPLANT
BAG URO CATCHER STRL LF (MISCELLANEOUS) ×3 IMPLANT
BASKET LASER NITINOL 1.9FR (BASKET) IMPLANT
CATH URETL OPEN END 6FR 70 (CATHETERS) ×3 IMPLANT
CLOTH BEACON ORANGE TIMEOUT ST (SAFETY) ×3 IMPLANT
DRAPE FOOT SWITCH (DRAPES) ×3 IMPLANT
ELECT REM PT RETURN 15FT ADLT (MISCELLANEOUS) ×3 IMPLANT
FORCEPS BIOP 2.4F 115CM BACKLD (INSTRUMENTS) IMPLANT
GLOVE SURG LX STRL 7.5 STRW (GLOVE) ×3 IMPLANT
GOWN STRL REUS W/ TWL XL LVL3 (GOWN DISPOSABLE) ×3 IMPLANT
GUIDEWIRE ANG ZIPWIRE 038X150 (WIRE) ×3 IMPLANT
GUIDEWIRE STR DUAL SENSOR (WIRE) ×3 IMPLANT
KIT TURNOVER KIT A (KITS) ×3 IMPLANT
LOOP CUT BIPOLAR 24F LRG (ELECTROSURGICAL) IMPLANT
MANIFOLD NEPTUNE II (INSTRUMENTS) ×3 IMPLANT
NS IRRIG 1000ML POUR BTL (IV SOLUTION) IMPLANT
PACK CYSTO (CUSTOM PROCEDURE TRAY) ×3 IMPLANT
SHEATH NAVIGATOR HD 11/13X28 (SHEATH) IMPLANT
SHEATH NAVIGATOR HD 11/13X36 (SHEATH) IMPLANT
STENT URET 6FRX26 CONTOUR (STENTS) IMPLANT
SYRINGE TOOMEY IRRIG 70ML (MISCELLANEOUS) IMPLANT
TRACTIP FLEXIVA PULS ID 200XHI (Laser) IMPLANT
TUBE PU 8FR 16IN ENFIT (TUBING) ×3 IMPLANT
TUBING CONNECTING 10 (TUBING) ×3 IMPLANT
TUBING UROLOGY SET (TUBING) ×3 IMPLANT

## 2023-12-16 NOTE — H&P (Signed)
 Daniel Shepherd is an 28 y.o. male.    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 Rt ureteral mass and Rt pelvic adenopathy.   2 - Right Malignant Hydronephrosis - Rt hydro first noted 06/2023 and some mass effect along expected course gonadal vessels. CT 10/2023 with continued mild hbydr and ? right intraluminal ureteral mass and large Rt pelvic adenopathy (not typical testis cancer pattern), Cr 1.3.   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 His PCP is with [facility] ([facility] in [place].   Today  Zoe is seen to proceed with cysto, right diagnostic ureteroscopy to rule out intraluminal neoplasm. Held eliquus as instructed / clearance on file.   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   Testicular cancer [facility])     Past Surgical History:  Procedure Laterality Date   HYDROCELE EXCISION Left 07/03/2017   Procedure: HYDROCELECTOMY ADULT/ INGUINAL APPROACH/ TESTICULAR BIOPSY/ ORCHIECTOMY;  Surgeon: Ottelin, Mark, MD;  Location: [facility];  Service: Urology;   Laterality: Left;   IR FLUORO GUIDE PORT INSERTION RIGHT  07/09/2017   IR IMAGING GUIDED PORT INSERTION  06/19/2023   IR REMOVAL TUN ACCESS W/ PORT W/O FL MOD SED  10/12/2017   IR US  GUIDE VASC ACCESS RIGHT  07/09/2017   NO PAST SURGERIES     ORCHIECTOMY Bilateral     Family History  Problem Relation Age of Onset   Healthy Mother    Healthy Father    Colon cancer Other        Lynch syndrome   Breast cancer Maternal Great-grandmother    Social History:  reports that he has never smoked. He has never used smokeless tobacco. He reports current alcohol use. He reports current drug use. Drug: Marijuana.  Allergies:  Allergies  Allergen Reactions   Bee Venom Anaphylaxis   Other Anaphylaxis and Swelling    Bee allergy    No medications prior to admission.    No results found for this or any previous visit (from the past 48 hours). No results found.  Review of Systems  Constitutional:  Negative for chills and fever.  All other systems reviewed and are negative.   Height 6' 2 (1.88 m), weight 90.7 kg. Physical Exam Vitals reviewed.  HENT:     Head: Normocephalic.  Eyes:     Pupils: Pupils are equal, round, and reactive to light.  Cardiovascular:     Rate  and Rhythm: Normal rate.  Pulmonary:     Effort: Pulmonary effort is normal.  Abdominal:     General: Abdomen is flat.  Genitourinary:    Comments: Stable anorchia Musculoskeletal:        General: Normal range of motion.     Cervical back: Normal range of motion.  Skin:    General: Skin is warm.  Neurological:     Mental Status: He is alert.  Psychiatric:        Mood and Affect: Mood normal.      Assessment/Plan  Proceed as planned with cysto, retrogrades, Rt ureteroscopy / possible biopsy / possible stent. Risks, benefits, alternatives, expected peri-op course discussed previously and reiterated today.   Ricardo KATHEE Alvaro Mickey., MD 12/16/2023, 6:46 AM

## 2023-12-16 NOTE — Transfer of Care (Signed)
 Immediate Anesthesia Transfer of Care Note  Patient: Daniel Shepherd  Procedure(s) Performed: CYSTOSCOPY/URETEROSCOPY/STENT PLACEMENT (Right) CYSTOSCOPY, WITH BILATERAL RETROGRADE PYELOGRAM (Bilateral) CYSTOSCOPY, WITH BIOPSY  Patient Location: PACU  Anesthesia Type:General  Level of Consciousness: drowsy and patient cooperative  Airway & Oxygen Therapy: Patient Spontanous Breathing and Patient connected to nasal cannula oxygen  Post-op Assessment: Report given to RN and Post -op Vital signs reviewed and stable  Post vital signs: Reviewed and stable  Last Vitals:  Vitals Value Taken Time  BP    Temp    Pulse    Resp    SpO2      Last Pain:  Vitals:   12/16/23 1428  TempSrc:   PainSc: 0-No pain      Patients Stated Pain Goal: 5 (12/16/23 1428)  Complications: No notable events documented.

## 2023-12-16 NOTE — Discharge Instructions (Signed)
 1 - You may have urinary urgency (bladder spasms) and bloody urine on / off with stent in place. This is normal.  2 - Call MD or go to ER for fever >102, severe pain / nausea / vomiting not relieved by medications, or acute change in medical status

## 2023-12-16 NOTE — Anesthesia Procedure Notes (Signed)
 Procedure Name: LMA Insertion Date/Time: 12/16/2023 5:23 PM  Performed by: Cena Epps, CRNAPre-anesthesia Checklist: Patient identified, Emergency Drugs available, Suction available and Patient being monitored Patient Re-evaluated:Patient Re-evaluated prior to induction Oxygen Delivery Method: Circle System Utilized Preoxygenation: Pre-oxygenation with 100% oxygen Induction Type: IV induction Ventilation: Mask ventilation without difficulty LMA: LMA inserted LMA Size: 5.0 Number of attempts: 1 Airway Equipment and Method: Bite block Placement Confirmation: positive ETCO2 Tube secured with: Tape Dental Injury: Teeth and Oropharynx as per pre-operative assessment

## 2023-12-16 NOTE — Brief Op Note (Signed)
 12/16/2023  6:07 PM  PATIENT:  Kimble Rung  28 y.o. male  PRE-OPERATIVE DIAGNOSIS:  RIGHT URETERAL MASS  POST-OPERATIVE DIAGNOSIS:  RIGHT URETERAL MASS  PROCEDURE:  Procedure(s): CYSTOSCOPY/URETEROSCOPY/STENT PLACEMENT (Right) CYSTOSCOPY, WITH BILATERAL RETROGRADE PYELOGRAM (Bilateral) CYSTOSCOPY, WITH BIOPSY (N/A)  SURGEON:  Surgeons and Role:    * Manny, Ricardo KATHEE Raddle., MD - Primary  PHYSICIAN ASSISTANT:   ASSISTANTS: none   ANESTHESIA:   general  EBL:  minimal   BLOOD ADMINISTERED:none  DRAINS: none   LOCAL MEDICATIONS USED:  NONE  SPECIMEN:  Source of Specimen:  Rt ureteral mass fragments  DISPOSITION OF SPECIMEN:  PATHOLOGY  COUNTS:  YES  TOURNIQUET:  * No tourniquets in log *  DICTATION: .Other Dictation: Dictation Number [id number]  PLAN OF CARE: Discharge to home after PACU  PATIENT DISPOSITION:  PACU - hemodynamically stable.   Delay start of Pharmacological VTE agent (>24hrs) due to surgical blood loss or risk of bleeding: yes

## 2023-12-16 NOTE — Anesthesia Preprocedure Evaluation (Addendum)
 Anesthesia Evaluation  Patient identified by MRN, date of birth, ID band Patient awake    Reviewed: Allergy & Precautions, NPO status , Patient's Chart, lab work & pertinent test results  Airway Mallampati: I  TM Distance: >3 FB Neck ROM: Full    Dental  (+) Dental Advisory Given, Chipped,    Pulmonary neg pulmonary ROS, Patient abstained from smoking.   Pulmonary exam normal breath sounds clear to auscultation       Cardiovascular hypertension, Pt. on medications + DVT  Normal cardiovascular exam Rhythm:Regular Rate:Normal     Neuro/Psych negative neurological ROS  negative psych ROS   GI/Hepatic negative GI ROS, Neg liver ROS,,,  Endo/Other  negative endocrine ROS    Renal/GU negative Renal ROS  negative genitourinary   Musculoskeletal negative musculoskeletal ROS (+)    Abdominal   Peds  Hematology  (+) Blood dyscrasia (eliquis )   Anesthesia Other Findings   Reproductive/Obstetrics                              Anesthesia Physical Anesthesia Plan  ASA: 2  Anesthesia Plan: General   Post-op Pain Management: Tylenol  PO (pre-op)*   Induction: Intravenous  PONV Risk Score and Plan: 2 and Ondansetron , Dexamethasone  and Midazolam   Airway Management Planned: LMA  Additional Equipment:   Intra-op Plan:   Post-operative Plan: Extubation in OR  Informed Consent: I have reviewed the patients History and Physical, chart, labs and discussed the procedure including the risks, benefits and alternatives for the proposed anesthesia with the patient or authorized representative who has indicated his/her understanding and acceptance.     Dental advisory given  Plan Discussed with: CRNA  Anesthesia Plan Comments:          Anesthesia Quick Evaluation

## 2023-12-17 ENCOUNTER — Encounter (HOSPITAL_COMMUNITY): Payer: Self-pay | Admitting: Urology

## 2023-12-17 ENCOUNTER — Encounter: Payer: Self-pay | Admitting: Hematology & Oncology

## 2023-12-17 NOTE — Op Note (Signed)
 Daniel Shepherd, Daniel Shepherd MEDICAL RECORD NO: [id number] ACCOUNT NO: [id number] DATE OF BIRTH: 1995-09-19 FACILITY: [facility] LOCATION: [facility] PHYSICIAN: Ricardo Likens, MD  Operative Report   DATE OF PROCEDURE: 12/16/2023  PREOPERATIVE DIAGNOSIS:  Right ureteral mass.  PROCEDURE PERFORMED: 1.  Cystoscopy, bilateral retrograde pyelograms, interpretation. 2.  Right ureteroscopy, biopsy. 3.  Insertion of right ureteral stent.  ESTIMATED BLOOD LOSS:  Nil.  COMPLICATIONS:  None.  SPECIMENS:  Right ureteral mass fragments for permanent pathology.  FINDINGS: 1.  Nodular neoplasm in the right mid-ureter just above the iliac crest, estimated 2-3 cm in maximal length. 2.  Hydronephrosis to the level of the right ureteral mass. 3.  Successful placement of right ureteral stent, proximal end in renal pelvis, distal end in urinary bladder.  INDICATIONS:  The patient is a very pleasant but unfortunate 28 year old young man with a history of bilateral asynchronous testicular cancer, status post bilateral orchiectomy.  He is followed by medical oncology quite closely.  He was found on  evaluation of hydronephrosis to have a questionable right ureteral mass.  It is unclear whether this was intraluminal or extraluminal.  He was referred for tissue sampling.  He presents for this today.  Informed consent was obtained and placed in medical  record.  DESCRIPTION OF PROCEDURE:  The patient being verified, procedure being cystoscope, bilateral retrogrades, right ureteroscopy with possible biopsy and stent was confirmed.  Procedure timeout was performed.  Intravenous antibiotics administered.  General  LMA anesthesia induced.  The patient was placed into a low lithotomy position.  Sterile field was created, prepping and draping the patient's penis, perineum and proximal thigh using iodine. Cystourethroscopy performed using 21-French rigid cystoscope  with offset lens.  Inspection of anterior and posterior urethra was  unremarkable.  Inspection of the bladder revealed no diverticula, calcifications, or papillary lesions.  Ureteral orifices were single.  The left ureteral orifice was cannulated with a 6  French end-hole catheter and left retrograde pyelogram was obtained.  Left retrograde pyelogram demonstrated single left ureter, single system left kidney.  No filling defects or narrowing noted.  Right retrograde pyelogram was obtained.  Right retrograde pyelogram demonstrated single right ureter, single system right kidney.  There is a fairly obstructing filling defect in the right mid-ureter consistent with a suspicious lesion.  There was minimal contrast flow above this at this point.   A 0.038 ZIPwire was very carefully navigated under multiple angulations such that this mass was navigated beyond.  Open-ended catheter was advanced above this.  Additional contrast revealed moderate hydronephrosis down to the level of the mass.  A  0.038 ZIPwire was advanced to the level of the upper pole, set aside as a safety wire.  An 8-French feeding tube placed in the urinary bladder for pressure release.  Semirigid ureteroscopy performed of the distal half of the right ureter alongside a  separate sensor working wire.  In the mid-ureter, as anticipated, there was certainly an intraluminal neoplasm.  This did not have a typical papillary appearance.  It was more nodular, but high-grade appearing.  The semirigid scope was then exchanged for  a short-length ureteral access sheath to the level of the distal third of the ureter over a central working wire and flexible digital ureteroscopy was performed alongside this with better visualization of the mass.  Again, it was estimated to be  approximately 2-3 cm in length and the ureteroscope was not able to advance above this.  Using the BIGopsy biopsy apparatus, approximately three passes were made in  an attempt to tissue sample this, which generated several small tissue fragments  which  were set aside.  Next, an Escape basket was used using a snare technique to obtain additional tissue fragments, which were more robust.  These were added to their previous specimens, set aside for permanent pathology, labeled as right mid-ureteral mass.   Given the relatively high-grade obstruction and sampling of the mass today, it was felt that interval stenting would clearly be warranted and a new 6 x 26 Contour-type stent was carefully placed over the safety wire using cystoscopic and fluoroscopic  guidance.  Good proximal and distal planes were noted.  The bladder was empty per cystoscope.  Procedure then terminated.  The patient tolerated the procedure well.  No immediate periprocedural complications.  The patient was taken to the post anesthesia  care unit in stable condition.  Plan for discharge home.    SHW D: 12/16/2023 6:13:11 pm T: 12/17/2023 2:20:00 am  JOB: [id number]/ [id number]

## 2023-12-21 ENCOUNTER — Inpatient Hospital Stay

## 2023-12-21 ENCOUNTER — Inpatient Hospital Stay: Attending: Hematology & Oncology

## 2023-12-21 ENCOUNTER — Other Ambulatory Visit: Payer: Self-pay | Admitting: *Deleted

## 2023-12-21 ENCOUNTER — Inpatient Hospital Stay (HOSPITAL_BASED_OUTPATIENT_CLINIC_OR_DEPARTMENT_OTHER): Admitting: Family

## 2023-12-21 ENCOUNTER — Other Ambulatory Visit: Payer: Self-pay

## 2023-12-21 ENCOUNTER — Encounter: Payer: Self-pay | Admitting: Family

## 2023-12-21 VITALS — BP 144/74 | HR 57 | Resp 20

## 2023-12-21 VITALS — BP 138/85 | HR 70 | Temp 98.6°F | Resp 20 | Ht 73.0 in | Wt 198.0 lb

## 2023-12-21 DIAGNOSIS — C7800 Secondary malignant neoplasm of unspecified lung: Secondary | ICD-10-CM | POA: Diagnosis not present

## 2023-12-21 DIAGNOSIS — C6291 Malignant neoplasm of right testis, unspecified whether descended or undescended: Secondary | ICD-10-CM | POA: Insufficient documentation

## 2023-12-21 DIAGNOSIS — Z79630 Long term (current) use of alkylating agent: Secondary | ICD-10-CM | POA: Diagnosis not present

## 2023-12-21 DIAGNOSIS — C801 Malignant (primary) neoplasm, unspecified: Secondary | ICD-10-CM | POA: Diagnosis not present

## 2023-12-21 DIAGNOSIS — Z79633 Long term (current) use of mitotic inhibitor: Secondary | ICD-10-CM | POA: Insufficient documentation

## 2023-12-21 DIAGNOSIS — Z5111 Encounter for antineoplastic chemotherapy: Secondary | ICD-10-CM | POA: Insufficient documentation

## 2023-12-21 DIAGNOSIS — C7802 Secondary malignant neoplasm of left lung: Secondary | ICD-10-CM

## 2023-12-21 DIAGNOSIS — Z5189 Encounter for other specified aftercare: Secondary | ICD-10-CM | POA: Insufficient documentation

## 2023-12-21 DIAGNOSIS — R319 Hematuria, unspecified: Secondary | ICD-10-CM

## 2023-12-21 LAB — CMP (CANCER CENTER ONLY)
ALT: 6 U/L (ref 0–44)
AST: 11 U/L — ABNORMAL LOW (ref 15–41)
Albumin: 4.2 g/dL (ref 3.5–5.0)
Alkaline Phosphatase: 112 U/L (ref 38–126)
Anion gap: 8 (ref 5–15)
BUN: 17 mg/dL (ref 6–20)
CO2: 28 mmol/L (ref 22–32)
Calcium: 9.6 mg/dL (ref 8.9–10.3)
Chloride: 101 mmol/L (ref 98–111)
Creatinine: 1.23 mg/dL (ref 0.61–1.24)
GFR, Estimated: >60 mL/min (ref >60)
Glucose, Bld: 93 mg/dL (ref 70–99)
Potassium: 3.9 mmol/L (ref 3.5–5.1)
Sodium: 137 mmol/L (ref 135–145)
Total Bilirubin: 0.5 mg/dL (ref 0.0–1.2)
Total Protein: 7.6 g/dL (ref 6.5–8.1)

## 2023-12-21 LAB — CBC WITH DIFFERENTIAL (CANCER CENTER ONLY)
Abs Immature Granulocytes: 0.02 K/uL (ref 0.00–0.07)
Basophils Absolute: 0 K/uL (ref 0.0–0.1)
Basophils Relative: 1 %
Eosinophils Absolute: 0.3 K/uL (ref 0.0–0.5)
Eosinophils Relative: 4 %
HCT: 39.2 % (ref 39.0–52.0)
Hemoglobin: 13.2 g/dL (ref 13.0–17.0)
Immature Granulocytes: 0 %
Lymphocytes Relative: 23 %
Lymphs Abs: 1.4 K/uL (ref 0.7–4.0)
MCH: 29.5 pg (ref 26.0–34.0)
MCHC: 33.7 g/dL (ref 30.0–36.0)
MCV: 87.7 fL (ref 80.0–100.0)
Monocytes Absolute: 0.4 K/uL (ref 0.1–1.0)
Monocytes Relative: 7 %
Neutro Abs: 3.9 K/uL (ref 1.7–7.7)
Neutrophils Relative %: 65 %
Platelet Count: 283 K/uL (ref 150–400)
RBC: 4.47 MIL/uL (ref 4.22–5.81)
RDW: 11.2 % — ABNORMAL LOW (ref 11.5–15.5)
WBC Count: 6.1 K/uL (ref 4.0–10.5)
nRBC: 0 % (ref 0.0–0.2)

## 2023-12-21 LAB — LACTATE DEHYDROGENASE: LDH: 398 U/L — ABNORMAL HIGH (ref 98–192)

## 2023-12-21 MED ORDER — HEPARIN SOD (PORK) LOCK FLUSH 100 UNIT/ML IV SOLN: 500.0000 [IU] | Freq: Once | INTRAVENOUS | Status: AC | PRN

## 2023-12-21 MED ORDER — SODIUM CHLORIDE 0.9% FLUSH: 10.0000 mL | INTRAVENOUS | Status: AC | PRN

## 2023-12-21 MED ORDER — ONDANSETRON HCL 4 MG/2ML IJ SOLN
8.0000 mg | Freq: Once | INTRAMUSCULAR | Status: AC
  Administered 2023-12-21: 8 mg via INTRAVENOUS
  Filled 2023-12-21: qty 4

## 2023-12-21 MED ORDER — SODIUM CHLORIDE 0.9 % IV SOLN
20.0000 mg | Freq: Once | INTRAVENOUS | Status: AC
  Administered 2023-12-21: 20 mg via INTRAVENOUS
  Filled 2023-12-21: qty 2

## 2023-12-21 MED ORDER — SODIUM CHLORIDE 0.9 % IV SOLN
200.0000 mg/m2 | Freq: Once | INTRAVENOUS | Status: AC
  Administered 2023-12-21: 438 mg via INTRAVENOUS
  Filled 2023-12-21: qty 73

## 2023-12-21 MED ORDER — COLD PACK MISC ONCOLOGY: 1.0000 | Freq: Every day | Status: AC | PRN

## 2023-12-21 MED ORDER — DIPHENHYDRAMINE HCL 50 MG/ML IJ SOLN
50.0000 mg | Freq: Once | INTRAMUSCULAR | Status: AC
  Administered 2023-12-21: 50 mg via INTRAVENOUS
  Filled 2023-12-21: qty 1

## 2023-12-21 MED ORDER — FAMOTIDINE IN NACL 20-0.9 MG/50ML-% IV SOLN
20.0000 mg | Freq: Once | INTRAVENOUS | Status: AC
  Administered 2023-12-21: 20 mg via INTRAVENOUS
  Filled 2023-12-21: qty 50

## 2023-12-21 MED ORDER — SODIUM CHLORIDE 0.9 % IV SOLN: INTRAVENOUS | Status: AC

## 2023-12-21 NOTE — Patient Instructions (Signed)
 [facility] - A DEPT OF [facility]  Discharge Instructions: Thank you for choosing [facility] to provide your oncology and hematology care.   If you have a lab appointment with the Cancer Center, please go directly to the Cancer Center and check in at the registration area.  Wear comfortable clothing and clothing appropriate for easy access to any Portacath or PICC line.   We strive to give you quality time with your provider. You may need to reschedule your appointment if you arrive late (15 or more minutes).  Arriving late affects you and other patients whose appointments are after yours.  Also, if you miss three or more appointments without notifying the office, you may be dismissed from the clinic at the provider's discretion.      For prescription refill requests, have your pharmacy contact our office and allow 72 hours for refills to be completed.    Today you received the following chemotherapy and/or immunotherapy agents  Taxol  via continuous IV pump infusion    To help prevent nausea and vomiting after your treatment, we encourage you to take your nausea medication as directed.  BELOW ARE SYMPTOMS THAT SHOULD BE REPORTED IMMEDIATELY: *FEVER GREATER THAN 100.4 F (38 C) OR HIGHER *CHILLS OR SWEATING *NAUSEA AND VOMITING THAT IS NOT CONTROLLED WITH YOUR NAUSEA MEDICATION *UNUSUAL SHORTNESS OF BREATH *UNUSUAL BRUISING OR BLEEDING *URINARY PROBLEMS (pain or burning when urinating, or frequent urination) *BOWEL PROBLEMS (unusual diarrhea, constipation, pain near the anus) TENDERNESS IN MOUTH AND THROAT WITH OR WITHOUT PRESENCE OF ULCERS (sore throat, sores in mouth, or a toothache) UNUSUAL RASH, SWELLING OR PAIN  UNUSUAL VAGINAL DISCHARGE OR ITCHING   Items with * indicate a potential emergency and should be followed up as soon as possible or go to the Emergency Department if any problems should occur.  Please show the CHEMOTHERAPY  ALERT CARD or IMMUNOTHERAPY ALERT CARD at check-in to the Emergency Department and triage nurse. Should you have questions after your visit or need to cancel or reschedule your appointment, please contact [facility] - [facility]PT OF [facility]  [phone] and follow the prompts.  Office hours are 8:00 a.m. to 4:30 p.m. Monday - Friday. Please note that voicemails left after 4:00 p.m. may not be returned until the following business day.  We are closed weekends and major holidays. You have access to a nurse at all times for urgent questions. Please call the main number to the clinic [phone] and follow the prompts.  For any non-urgent questions, you may also contact your provider using MyChart. We now offer e-Visits for anyone 93 and older to request care online for non-urgent symptoms. For details visit [email or web address].   Also download the MyChart app! Go to the app store, search MyChart, open the app, select [place], and log in with your MyChart username and password.

## 2023-12-21 NOTE — Patient Instructions (Signed)

## 2023-12-21 NOTE — Progress Notes (Signed)
 Hematology and Oncology Follow Up Visit  Daniel Shepherd [id number] 1995/10/01 28 y.o. 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 twice daily -started on 07/13/2023 TIP -- start cycle #1 on 12/21/2023                                   Interim History:  Daniel Shepherd is here today for follow-up and to start cycle 1 of new TIP. He has recuperated nicely from his cystoscopy with right ureteroscopy biopsy and right urethral stent placement.  He has had scant blood in his urine and states that he is only a little sore in the left side from the stent placement.  Eliquis  is still being held for now post procedure.  No fever, chills, n/v, cough, rash, dizziness, SOB, chest pain, palpitations.  He has started Senna for constipation and this seems to have since resolved.  AFP 2 weeks ago was 7.5 and Beta HCG 4.3.  No swelling, tenderness, numbness or tingling in his extremities.  No falls or syncope reported.  Appetite and hydration are good. Weight is stable at 198 lbs.  He continues to stay active and feels his energy level is great. He is working out at [employer] regularly.   ECOG Performance Status: 1 - Symptomatic but completely ambulatory  Medications:  Allergies as of 12/21/2023       Reactions   Bee Venom Anaphylaxis   Other Anaphylaxis, Swelling   Bee allergy        Medication List        Accurate as of December 21, 2023  9:05 AM. If you have any questions, ask your nurse or doctor.          PAUSE taking these medications    apixaban  2.5 MG Tabs tablet Wait to take this until your  doctor or other care provider tells you to start again. Commonly known as: ELIQUIS  Take 1 tablet (2.5 mg total) by mouth 2 (two) times daily.       TAKE these medications    losartan  25 MG tablet Commonly known as: COZAAR  Take 12.5 mg by mouth daily.   multivitamin tablet Take 1 tablet by mouth in the morning.   OLANZapine  10 MG tablet Commonly known as: ZYPREXA  Take 1 tablet (10 mg total) by mouth at bedtime.   oxyCODONE -acetaminophen  5-325 MG tablet Commonly known as: Percocet Take 1 tablet by mouth every 6 (six) hours as needed for moderate pain (pain score 4-6) or severe pain (pain score 7-10) (post-operatively).   senna-docusate 8.6-50 MG tablet Commonly known as: Senokot-S Take 1 tablet by mouth 2 (two) times daily. While taking strong pain meds to prevent constipation.   testosterone cypionate 200 MG/ML injection Commonly known as: DEPOTESTOSTERONE CYPIONATE Inject 100 mg into the muscle every Sunday.        Allergies:  Allergies  Allergen Reactions   Bee Venom Anaphylaxis   Other Anaphylaxis and Swelling    Bee allergy    Past Medical History, Surgical history, Social history, and Family  History were reviewed and updated.  Review of Systems: All other 10 point review of systems is negative.   Physical Exam:  height is 6' 1 (1.854 m) and weight is 198 lb (89.8 kg). His oral temperature is 98.6 F (37 C). His blood pressure is 138/85 and his pulse is 70. His respiration is 20 and oxygen saturation is 100%.   Wt Readings from Last 3 Encounters:  12/21/23 198 lb (89.8 kg)  12/16/23 200 lb (90.7 kg)  12/09/23 203 lb 14.4 oz (92.5 kg)    Ocular: Sclerae unicteric, pupils equal, round and reactive to light Ear-nose-throat: Oropharynx clear, dentition fair Lymphatic: No cervical or supraclavicular adenopathy Lungs no rales or rhonchi, good excursion bilaterally Heart regular rate and rhythm, no murmur appreciated Abd soft, nontender, positive bowel  sounds MSK no focal spinal tenderness, no joint edema Neuro: non-focal, well-oriented, appropriate affect Breasts: Deferred   Lab Results  Component Value Date   WBC 6.1 12/21/2023   HGB 13.2 12/21/2023   HCT 39.2 12/21/2023   MCV 87.7 12/21/2023   PLT 283 12/21/2023   No results found for: FERRITIN, IRON, TIBC, UIBC, IRONPCTSAT Lab Results  Component Value Date   RBC 4.47 12/21/2023   No results found for: KPAFRELGTCHN, LAMBDASER, KAPLAMBRATIO No results found for: IGGSERUM, IGA, IGMSERUM No results found for: STEPHANY CARLOTA BENSON MARKEL EARLA JOANNIE DOC VICK, SPEI   Chemistry      Component Value Date/Time   NA 137 12/21/2023 0800   NA 141 10/29/2022 0931   K 3.9 12/21/2023 0800   CL 101 12/21/2023 0800   CO2 28 12/21/2023 0800   BUN 17 12/21/2023 0800   BUN 18 10/29/2022 0931   CREATININE 1.23 12/21/2023 0800      Component Value Date/Time   CALCIUM 9.6 12/21/2023 0800   ALKPHOS 112 12/21/2023 0800   AST 11 (L) 12/21/2023 0800   ALT 6 12/21/2023 0800   BILITOT 0.5 12/21/2023 0800       Impression and Plan: Daniel Shepherd is a 28 yo caucasian gentleman with metastatic embryonal cell carcinoma.  He had a left orchiectomy back on July 03, 2017.  A week later, he showed up in the emergency room with chest discomfort and some shortness of breath and had innumerable pulmonary nodules.  He was treated with systemic chemotherapy.  He had a complete response.   He then developed a second testicular cancer in the right testicle.  This was a pure seminoma, early-stage.   The seminoma then relapsed.  He had 3 cycles of chemotherapy with BEP.  He had an initial response and then relapsed again.   He is here today to started cycle 1 of TIP in the salvage setting.    Per Dr. Timmy, I think that we really have to push hard given his young age, excellent performance status and the fact that we have a stem cell transplant  that we could consider.  We discussed some potential side effects to be mindful of. He will reach out to our office with any questions or concerns.   We will proceed with treatment today as planned.   Follow-up in 3 weeks with MD.    Lauraine Pepper, NP 7/7/20259:05 AM

## 2023-12-21 NOTE — Progress Notes (Signed)
 Rate includes overfill. Continuous infusion over 24 hours @ 26 ml/hr. Will observe patient for 2 hours after pump start in clinic.  Bridgett Leach Blaine, [place], BCPS, BCOP 12/21/2023 9:44 AM

## 2023-12-22 ENCOUNTER — Inpatient Hospital Stay

## 2023-12-22 ENCOUNTER — Encounter: Payer: Self-pay | Admitting: Hematology & Oncology

## 2023-12-22 ENCOUNTER — Encounter: Payer: Self-pay | Admitting: *Deleted

## 2023-12-22 ENCOUNTER — Other Ambulatory Visit: Payer: Self-pay | Admitting: Family

## 2023-12-22 VITALS — BP 135/62 | HR 93 | Temp 97.7°F | Resp 18

## 2023-12-22 DIAGNOSIS — C7802 Secondary malignant neoplasm of left lung: Secondary | ICD-10-CM

## 2023-12-22 DIAGNOSIS — C6291 Malignant neoplasm of right testis, unspecified whether descended or undescended: Secondary | ICD-10-CM

## 2023-12-22 DIAGNOSIS — R319 Hematuria, unspecified: Secondary | ICD-10-CM

## 2023-12-22 DIAGNOSIS — Z5111 Encounter for antineoplastic chemotherapy: Secondary | ICD-10-CM | POA: Diagnosis not present

## 2023-12-22 LAB — URINALYSIS, COMPLETE (UACMP) WITH MICROSCOPIC
Bacteria, UA: NONE SEEN
Bilirubin Urine: NEGATIVE
Glucose, UA: NEGATIVE mg/dL
Ketones, ur: 80 mg/dL — AB
Nitrite: NEGATIVE
Protein, ur: 100 mg/dL — AB
RBC / HPF: >50 RBC/hpf (ref 0–5)
Specific Gravity, Urine: 1.02 (ref 1.005–1.030)
Squamous Epithelial / HPF: NONE SEEN /HPF (ref 0–5)
pH: 6.5 (ref 5.0–8.0)

## 2023-12-22 LAB — AFP TUMOR MARKER: AFP, Serum, Tumor Marker: 12.1 ng/mL — ABNORMAL HIGH (ref 0.0–5.7)

## 2023-12-22 LAB — BETA HCG QUANT (REF LAB): hCG Quant: 7 m[IU]/mL — ABNORMAL HIGH (ref 0–3)

## 2023-12-22 LAB — MAGNESIUM: Magnesium: 2.2 mg/dL (ref 1.7–2.4)

## 2023-12-22 LAB — SURGICAL PATHOLOGY

## 2023-12-22 MED ORDER — POTASSIUM CHLORIDE IN NACL 20-0.9 MEQ/L-% IV SOLN
Freq: Once | INTRAVENOUS | Status: AC
  Filled 2023-12-22: qty 1000

## 2023-12-22 MED ORDER — HEPARIN SOD (PORK) LOCK FLUSH 100 UNIT/ML IV SOLN
500.0000 [IU] | Freq: Once | INTRAVENOUS | Status: AC
  Administered 2023-12-22: 500 [IU] via INTRAVENOUS

## 2023-12-22 MED ORDER — SODIUM CHLORIDE 0.9 % IV SOLN
Freq: Once | INTRAVENOUS | Status: AC
  Filled 2023-12-22: qty 5

## 2023-12-22 MED ORDER — MAGNESIUM SULFATE 2 GM/50ML IV SOLN
2.0000 g | Freq: Once | INTRAVENOUS | Status: AC
  Administered 2023-12-22: 2 g via INTRAVENOUS
  Filled 2023-12-22: qty 50

## 2023-12-22 MED ORDER — PALONOSETRON HCL INJECTION 0.25 MG/5ML
0.2500 mg | Freq: Once | INTRAVENOUS | Status: AC
  Administered 2023-12-22: 0.25 mg via INTRAVENOUS
  Filled 2023-12-22: qty 5

## 2023-12-22 MED ORDER — SODIUM CHLORIDE 0.9 % IV SOLN
300.0000 mg/m2 | Freq: Once | INTRAVENOUS | Status: AC
  Administered 2023-12-22: 650 mg via INTRAVENOUS
  Filled 2023-12-22: qty 6.5

## 2023-12-22 MED ORDER — SODIUM CHLORIDE 0.9 % IV SOLN
25.0000 mg/m2 | Freq: Once | INTRAVENOUS | Status: AC
  Administered 2023-12-22: 50 mg via INTRAVENOUS
  Filled 2023-12-22: qty 50

## 2023-12-22 MED ORDER — SODIUM CHLORIDE 0.9 % IV SOLN
Freq: Once | INTRAVENOUS | Status: AC
  Filled 2023-12-22: qty 60

## 2023-12-22 MED ORDER — SODIUM CHLORIDE 0.9% FLUSH
10.0000 mL | INTRAVENOUS | Status: DC | PRN
  Administered 2023-12-22: 10 mL via INTRAVENOUS

## 2023-12-22 MED ORDER — SODIUM CHLORIDE 0.9 % IV SOLN: INTRAVENOUS | Status: DC

## 2023-12-22 MED ORDER — DEXAMETHASONE SODIUM PHOSPHATE 10 MG/ML IJ SOLN
10.0000 mg | Freq: Once | INTRAMUSCULAR | Status: AC
  Administered 2023-12-22: 10 mg via INTRAVENOUS
  Filled 2023-12-22: qty 1

## 2023-12-22 NOTE — Progress Notes (Signed)
 Opened in error

## 2023-12-22 NOTE — Progress Notes (Signed)
 Urinalysis abnormal today.  Patient had a stent placed last week.  Okay to proceed with treatment per Dr. Timmy.

## 2023-12-22 NOTE — Progress Notes (Signed)
 Patient is in the office this week for cycle one TIP. He will need a CT CAP for restaging after this cycle. Order placed.   Spoke to patient and gave him information on new order and instructions to schedule.   Oncology Nurse Navigator Documentation     12/22/2023    9:15 AM  Oncology Nurse Navigator Flowsheets  Chemotherapy Actual Start Date: 12/21/2023  Navigator Follow Up Date: 01/01/2024  Navigator Follow Up Reason: Scan Review  Navigator Location [facility]  Navigator Encounter Type Treatment  Patient Visit Type MedOnc  Treatment Phase Active Tx  Barriers/Navigation Needs Coordination of Care;Education  Education Other  Interventions Coordination of Care;Education  Acuity Level 1-No Barriers  Coordination of Care Radiology  Education Method Verbal;Written  Support Groups/Services Friends and Family  Time Spent with Patient 30

## 2023-12-22 NOTE — Patient Instructions (Signed)
 [facility] A [facility]PT OF [facility]  Discharge Instructions: Thank you for choosing [facility] to provide your oncology and hematology care.   If you have a lab appointment with the Cancer Center, please go directly to the Cancer Center and check in at the registration area.  Wear comfortable clothing and clothing appropriate for easy access to any Portacath or PICC line.   We strive to give you quality time with your provider. You may need to reschedule your appointment if you arrive late (15 or more minutes).  Arriving late affects you and other patients whose appointments are after yours.  Also, if you miss three or more appointments without notifying the office, you may be dismissed from the clinic at the provider's discretion.      For prescription refill requests, have your pharmacy contact our office and allow 72 hours for refills to be completed.    Today you received the following chemotherapy and/or immunotherapy agents:  Ifosfamide , Mesna  and Cisplatin       To help prevent nausea and vomiting after your treatment, we encourage you to take your nausea medication as directed.  BELOW ARE SYMPTOMS THAT SHOULD BE REPORTED IMMEDIATELY: *FEVER GREATER THAN 100.4 F (38 C) OR HIGHER *CHILLS OR SWEATING *NAUSEA AND VOMITING THAT IS NOT CONTROLLED WITH YOUR NAUSEA MEDICATION *UNUSUAL SHORTNESS OF BREATH *UNUSUAL BRUISING OR BLEEDING *URINARY PROBLEMS (pain or burning when urinating, or frequent urination) *BOWEL PROBLEMS (unusual diarrhea, constipation, pain near the anus) TENDERNESS IN MOUTH AND THROAT WITH OR WITHOUT PRESENCE OF ULCERS (sore throat, sores in mouth, or a toothache) UNUSUAL RASH, SWELLING OR PAIN  UNUSUAL VAGINAL DISCHARGE OR ITCHING   Items with * indicate a potential emergency and should be followed up as soon as possible or go to the Emergency Department if any problems should occur.  Please show the CHEMOTHERAPY ALERT  CARD or IMMUNOTHERAPY ALERT CARD at check-in to the Emergency Department and triage nurse. Should you have questions after your visit or need to cancel or reschedule your appointment, please contact [facility] - A DEPT OF [facility]  [phone] and follow the prompts.  Office hours are 8:00 a.m. to 4:30 p.m. Monday - Friday. Please note that voicemails left after 4:00 p.m. may not be returned until the following business day.  We are closed weekends and major holidays. You have access to a nurse at all times for urgent questions. Please call the main number to the clinic [phone] and follow the prompts.  For any non-urgent questions, you may also contact your provider using MyChart. We now offer e-Visits for anyone 61 and older to request care online for non-urgent symptoms. For details visit [email or web address].   Also download the MyChart app! Go to the app store, search MyChart, open the app, select [place], and log in with your MyChart username and password.

## 2023-12-22 NOTE — Progress Notes (Signed)
 1500 Results of Urinalysis showed to Lauraine Pepper NP.  Urine culture ordered.  Dr Timmy notified via chat about the urinalysis.  Ok to proceed with chemotherapy.

## 2023-12-23 ENCOUNTER — Inpatient Hospital Stay

## 2023-12-23 ENCOUNTER — Encounter: Payer: Self-pay | Admitting: Hematology & Oncology

## 2023-12-23 ENCOUNTER — Other Ambulatory Visit: Payer: Self-pay

## 2023-12-23 ENCOUNTER — Inpatient Hospital Stay: Admitting: Licensed Clinical Social Worker

## 2023-12-23 VITALS — BP 160/63 | HR 81 | Temp 97.5°F | Resp 17

## 2023-12-23 DIAGNOSIS — Z5111 Encounter for antineoplastic chemotherapy: Secondary | ICD-10-CM | POA: Diagnosis not present

## 2023-12-23 DIAGNOSIS — C6291 Malignant neoplasm of right testis, unspecified whether descended or undescended: Secondary | ICD-10-CM

## 2023-12-23 MED ORDER — SODIUM CHLORIDE 0.9 % IV SOLN
Freq: Once | INTRAVENOUS | Status: AC
  Filled 2023-12-23: qty 60

## 2023-12-23 MED ORDER — POTASSIUM CHLORIDE IN NACL 20-0.9 MEQ/L-% IV SOLN
Freq: Once | INTRAVENOUS | Status: AC
  Filled 2023-12-23: qty 1000

## 2023-12-23 MED ORDER — SODIUM CHLORIDE 0.9 % IV SOLN
25.0000 mg/m2 | Freq: Once | INTRAVENOUS | Status: AC
  Administered 2023-12-23: 50 mg via INTRAVENOUS
  Filled 2023-12-23: qty 50

## 2023-12-23 MED ORDER — SODIUM CHLORIDE 0.9% FLUSH
10.0000 mL | Freq: Once | INTRAVENOUS | Status: AC
  Administered 2023-12-23: 10 mL via INTRAVENOUS

## 2023-12-23 MED ORDER — SODIUM CHLORIDE 0.9 % IV SOLN
300.0000 mg/m2 | Freq: Once | INTRAVENOUS | Status: AC
  Administered 2023-12-23: 650 mg via INTRAVENOUS
  Filled 2023-12-23: qty 6.5

## 2023-12-23 MED ORDER — HEPARIN SOD (PORK) LOCK FLUSH 100 UNIT/ML IV SOLN
500.0000 [IU] | Freq: Once | INTRAVENOUS | Status: AC
  Administered 2023-12-23: 500 [IU] via INTRAVENOUS

## 2023-12-23 MED ORDER — MAGNESIUM SULFATE 2 GM/50ML IV SOLN
2.0000 g | Freq: Once | INTRAVENOUS | Status: AC
  Administered 2023-12-23: 2 g via INTRAVENOUS
  Filled 2023-12-23: qty 50

## 2023-12-23 MED ORDER — SODIUM CHLORIDE 0.9 % IV SOLN: INTRAVENOUS | Status: DC

## 2023-12-23 MED ORDER — DEXAMETHASONE SODIUM PHOSPHATE 10 MG/ML IJ SOLN
10.0000 mg | Freq: Once | INTRAMUSCULAR | Status: AC
  Administered 2023-12-23: 10 mg via INTRAVENOUS
  Filled 2023-12-23: qty 1

## 2023-12-23 NOTE — Progress Notes (Signed)
 [facility] CSW Progress Note  Visual merchandiser met with patient to follow-up on emotional support.    Interventions: Provided brief mental health counseling with regard to adjusting to his illness.       Follow Up Plan:  CSW will follow-up with patient by phone     Daniel Shepherd Au, LCSW Clinical Social Worker [facility]    Patient is participating in a Managed Medicaid Plan:  Yes

## 2023-12-23 NOTE — Progress Notes (Signed)
 Ok to run post hydration fluids with Cisplatin  infusion per Dr. Timmy

## 2023-12-23 NOTE — Patient Instructions (Signed)
 [facility] A [facility]PT OF [facility]  Discharge Instructions: Thank you for choosing [facility] to provide your oncology and hematology care.   If you have a lab appointment with the Cancer Center, please go directly to the Cancer Center and check in at the registration area.  Wear comfortable clothing and clothing appropriate for easy access to any Portacath or PICC line.   We strive to give you quality time with your provider. You may need to reschedule your appointment if you arrive late (15 or more minutes).  Arriving late affects you and other patients whose appointments are after yours.  Also, if you miss three or more appointments without notifying the office, you may be dismissed from the clinic at the provider's discretion.      For prescription refill requests, have your pharmacy contact our office and allow 72 hours for refills to be completed.    Today you received the following chemotherapy and/or immunotherapy agents:  Ifosfamide , Mesna  and Cisplatin       To help prevent nausea and vomiting after your treatment, we encourage you to take your nausea medication as directed.  BELOW ARE SYMPTOMS THAT SHOULD BE REPORTED IMMEDIATELY: *FEVER GREATER THAN 100.4 F (38 C) OR HIGHER *CHILLS OR SWEATING *NAUSEA AND VOMITING THAT IS NOT CONTROLLED WITH YOUR NAUSEA MEDICATION *UNUSUAL SHORTNESS OF BREATH *UNUSUAL BRUISING OR BLEEDING *URINARY PROBLEMS (pain or burning when urinating, or frequent urination) *BOWEL PROBLEMS (unusual diarrhea, constipation, pain near the anus) TENDERNESS IN MOUTH AND THROAT WITH OR WITHOUT PRESENCE OF ULCERS (sore throat, sores in mouth, or a toothache) UNUSUAL RASH, SWELLING OR PAIN  UNUSUAL VAGINAL DISCHARGE OR ITCHING   Items with * indicate a potential emergency and should be followed up as soon as possible or go to the Emergency Department if any problems should occur.  Please show the CHEMOTHERAPY ALERT  CARD or IMMUNOTHERAPY ALERT CARD at check-in to the Emergency Department and triage nurse. Should you have questions after your visit or need to cancel or reschedule your appointment, please contact [facility] - A DEPT OF [facility]  [phone] and follow the prompts.  Office hours are 8:00 a.m. to 4:30 p.m. Monday - Friday. Please note that voicemails left after 4:00 p.m. may not be returned until the following business day.  We are closed weekends and major holidays. You have access to a nurse at all times for urgent questions. Please call the main number to the clinic [phone] and follow the prompts.  For any non-urgent questions, you may also contact your provider using MyChart. We now offer e-Visits for anyone 61 and older to request care online for non-urgent symptoms. For details visit [email or web address].   Also download the MyChart app! Go to the app store, search MyChart, open the app, select [place], and log in with your MyChart username and password.

## 2023-12-23 NOTE — Progress Notes (Signed)
 Ok to delete orders for UA for 7/9-7/11.  Proceed with chemotherapy despite abnormal UA on 12/22/23. See previous note on 7/8 by pharmacist.  Bridgett Leotis Helling, RPH, BCPS, BCOPS 12/23/2023  10:35 AM

## 2023-12-24 ENCOUNTER — Other Ambulatory Visit: Payer: Self-pay | Admitting: *Deleted

## 2023-12-24 ENCOUNTER — Other Ambulatory Visit

## 2023-12-24 ENCOUNTER — Ambulatory Visit: Payer: Self-pay | Admitting: Hematology & Oncology

## 2023-12-24 ENCOUNTER — Inpatient Hospital Stay

## 2023-12-24 ENCOUNTER — Inpatient Hospital Stay: Admitting: Dietician

## 2023-12-24 VITALS — BP 159/89 | HR 57 | Temp 97.9°F | Resp 18

## 2023-12-24 DIAGNOSIS — Z1379 Encounter for other screening for genetic and chromosomal anomalies: Secondary | ICD-10-CM

## 2023-12-24 DIAGNOSIS — C6292 Malignant neoplasm of left testis, unspecified whether descended or undescended: Secondary | ICD-10-CM

## 2023-12-24 DIAGNOSIS — Z5111 Encounter for antineoplastic chemotherapy: Secondary | ICD-10-CM | POA: Diagnosis not present

## 2023-12-24 DIAGNOSIS — S46912A Strain of unspecified muscle, fascia and tendon at shoulder and upper arm level, left arm, initial encounter: Secondary | ICD-10-CM

## 2023-12-24 DIAGNOSIS — I8290 Acute embolism and thrombosis of unspecified vein: Secondary | ICD-10-CM

## 2023-12-24 DIAGNOSIS — Z803 Family history of malignant neoplasm of breast: Secondary | ICD-10-CM

## 2023-12-24 DIAGNOSIS — C6291 Malignant neoplasm of right testis, unspecified whether descended or undescended: Secondary | ICD-10-CM

## 2023-12-24 DIAGNOSIS — R319 Hematuria, unspecified: Secondary | ICD-10-CM

## 2023-12-24 DIAGNOSIS — R0789 Other chest pain: Secondary | ICD-10-CM

## 2023-12-24 DIAGNOSIS — D709 Neutropenia, unspecified: Secondary | ICD-10-CM

## 2023-12-24 DIAGNOSIS — C7802 Secondary malignant neoplasm of left lung: Secondary | ICD-10-CM

## 2023-12-24 DIAGNOSIS — K625 Hemorrhage of anus and rectum: Secondary | ICD-10-CM

## 2023-12-24 DIAGNOSIS — I440 Atrioventricular block, first degree: Secondary | ICD-10-CM

## 2023-12-24 DIAGNOSIS — D649 Anemia, unspecified: Secondary | ICD-10-CM

## 2023-12-24 DIAGNOSIS — E871 Hypo-osmolality and hyponatremia: Secondary | ICD-10-CM

## 2023-12-24 DIAGNOSIS — M25512 Pain in left shoulder: Secondary | ICD-10-CM

## 2023-12-24 DIAGNOSIS — R1909 Other intra-abdominal and pelvic swelling, mass and lump: Secondary | ICD-10-CM

## 2023-12-24 DIAGNOSIS — Z800 Family history of malignant neoplasm of digestive organs: Secondary | ICD-10-CM

## 2023-12-24 LAB — URINE CULTURE: Culture: <10000 — AB

## 2023-12-24 MED ORDER — SODIUM CHLORIDE 0.9 % IV SOLN
Freq: Once | INTRAVENOUS | Status: AC
  Filled 2023-12-24: qty 60

## 2023-12-24 MED ORDER — HEPARIN SOD (PORK) LOCK FLUSH 100 UNIT/ML IV SOLN
500.0000 [IU] | Freq: Once | INTRAVENOUS | Status: AC
  Administered 2023-12-24: 500 [IU] via INTRAVENOUS

## 2023-12-24 MED ORDER — SODIUM CHLORIDE 0.9% FLUSH
10.0000 mL | INTRAVENOUS | Status: DC | PRN
  Administered 2023-12-24: 10 mL via INTRAVENOUS

## 2023-12-24 MED ORDER — DEXAMETHASONE SODIUM PHOSPHATE 10 MG/ML IJ SOLN
10.0000 mg | Freq: Once | INTRAMUSCULAR | Status: AC
  Administered 2023-12-24: 10 mg via INTRAVENOUS
  Filled 2023-12-24: qty 1

## 2023-12-24 MED ORDER — SODIUM CHLORIDE 0.9 % IV SOLN
300.0000 mg/m2 | Freq: Once | INTRAVENOUS | Status: AC
  Administered 2023-12-24: 650 mg via INTRAVENOUS
  Filled 2023-12-24: qty 6.5

## 2023-12-24 MED ORDER — ONDANSETRON HCL 4 MG/2ML IJ SOLN
8.0000 mg | Freq: Once | INTRAMUSCULAR | Status: AC
  Administered 2023-12-24: 8 mg via INTRAVENOUS
  Filled 2023-12-24: qty 4

## 2023-12-24 MED ORDER — MAGNESIUM SULFATE 2 GM/50ML IV SOLN
2.0000 g | Freq: Once | INTRAVENOUS | Status: AC
  Administered 2023-12-24: 2 g via INTRAVENOUS
  Filled 2023-12-24: qty 50

## 2023-12-24 MED ORDER — CIPROFLOXACIN HCL 500 MG PO TABS: 500.0000 mg | ORAL_TABLET | Freq: Two times a day (BID) | ORAL | 0 refills | Status: DC

## 2023-12-24 MED ORDER — PROCHLORPERAZINE MALEATE 10 MG PO TABS
10.0000 mg | ORAL_TABLET | Freq: Once | ORAL | Status: AC
  Administered 2023-12-24: 10 mg via ORAL
  Filled 2023-12-24: qty 1

## 2023-12-24 MED ORDER — POTASSIUM CHLORIDE IN NACL 20-0.9 MEQ/L-% IV SOLN
Freq: Once | INTRAVENOUS | Status: AC
  Filled 2023-12-24: qty 1000

## 2023-12-24 MED ORDER — SODIUM CHLORIDE 0.9 % IV SOLN
25.0000 mg/m2 | Freq: Once | INTRAVENOUS | Status: AC
  Administered 2023-12-24: 50 mg via INTRAVENOUS
  Filled 2023-12-24: qty 50

## 2023-12-24 MED ORDER — SODIUM CHLORIDE 0.9 % IV SOLN: INTRAVENOUS | Status: DC

## 2023-12-24 NOTE — Telephone Encounter (Signed)
-----   Message from Maude JONELLE Crease sent at 12/24/2023  8:24 AM EDT ----- Call - the urine is ok.  Please make sure that he is on prophylactic antibiotics after this cycle of chemo. Cipro  or Bactrim  are fine.  Jeralyn ----- Message ----- From: Rebecka, Lab In [place] Sent: 12/22/2023   9:25 AM EDT To: Maude JONELLE Crease, MD

## 2023-12-24 NOTE — Progress Notes (Signed)
 [facility] CSW Progress Note  Clinical Child psychotherapist contacted patient by phone to follow-up on need for community resources.    Interventions: Provided patient with information about possible grants at [facility] for his transplant.  He is already working with their Child psychotherapist and was told the amount he could possibly receive.  This does not cover the cost of the apartment.  He said his family and friends are doing Nurse, learning disability for him.  He also stated he was not eligible for food stamps due to income.  I is not eligible for the [employer].       Follow Up Plan:  Patient will contact CSW with any support or resource needs    Macario CHRISTELLA Au, LCSW Clinical Social Worker [facility]

## 2023-12-24 NOTE — Progress Notes (Signed)
 Nutrition Follow Up:  Reached out to patient at his mobile# during infusion. Patient reports weight loss was intentional.  He feel leaner and better with reduced weight.  Relate weight loss to cutting out some sodas.  His appetite and hydration are good and he is continuing with working out. He is starting new chemo routine today.  Now getting Taxol /ifosfamide /cisplatin .  He has been evaluated at [facility] for stem cell transplant.   Labs: 12/21/23 reviewed   Anthropometrics: weight loss 22# in once month  Height: 74 Weight:  12/21/23  198#  DBW: 200# BMI: 26.96   Estimated Energy Needs  Kcals: 2900-3400 Protein: 115-144 g Fluid: 3 L   NUTRITION DIAGNOSIS: Inadequate PO intake to meet increased nutrient needs r/t cancer and treatments. Intentional weight loss   INTERVENTION:   Encouraged continued attentiveness to nutrient rich food choices. Encouraged him to reach out if he experiences any NIS from new treatment. Offered resources and recipes, patient decline need at this time  MONITORING, EVALUATION, GOAL: weight trends, nutrition impact symptoms, PO intake, labs  Goal is maintenance  Next Visit: PRN at patient or provider request  Micheline Craven, RDN, LDN Registered Dietitian, [facility] Part Time Remote (Usual office hours: Tuesday-Thursday) Mobile: [phone]

## 2023-12-24 NOTE — Progress Notes (Signed)
 After going to the bathroom patient informed me that he noticed blood in his stool. Bright red blood was noticed on the paper toilet, but not in the toilet per patient. Lauraine Dais, PA notified. Patient was instructed to monitor his future stools and inform us  if he sees blood again.

## 2023-12-24 NOTE — Patient Instructions (Signed)
 [facility] - A DEPT OF [place]  Discharge Instructions: Thank you for choosing [facility] to provide your oncology and hematology care.   If you have a lab appointment with the Cancer Center, please go directly to the Cancer Center and check in at the registration area.  Wear comfortable clothing and clothing appropriate for easy access to any Portacath or PICC line.   We strive to give you quality time with your provider. You may need to reschedule your appointment if you arrive late (15 or more minutes).  Arriving late affects you and other patients whose appointments are after yours.  Also, if you miss three or more appointments without notifying the office, you may be dismissed from the clinic at the provider's discretion.      For prescription refill requests, have your pharmacy contact our office and allow 72 hours for refills to be completed.    Today you received the following chemotherapy and/or immunotherapy agents: Mesna , Ifosfamide  and Cisplatin       To help prevent nausea and vomiting after your treatment, we encourage you to take your nausea medication as directed.  BELOW ARE SYMPTOMS THAT SHOULD BE REPORTED IMMEDIATELY: *FEVER GREATER THAN 100.4 F (38 C) OR HIGHER *CHILLS OR SWEATING *NAUSEA AND VOMITING THAT IS NOT CONTROLLED WITH YOUR NAUSEA MEDICATION *UNUSUAL SHORTNESS OF BREATH *UNUSUAL BRUISING OR BLEEDING *URINARY PROBLEMS (pain or burning when urinating, or frequent urination) *BOWEL PROBLEMS (unusual diarrhea, constipation, pain near the anus) TENDERNESS IN MOUTH AND THROAT WITH OR WITHOUT PRESENCE OF ULCERS (sore throat, sores in mouth, or a toothache) UNUSUAL RASH, SWELLING OR PAIN  UNUSUAL VAGINAL DISCHARGE OR ITCHING   Items with * indicate a potential emergency and should be followed up as soon as possible or go to the Emergency Department if any problems should occur.  Please show the CHEMOTHERAPY ALERT  CARD or IMMUNOTHERAPY ALERT CARD at check-in to the Emergency Department and triage nurse. Should you have questions after your visit or need to cancel or reschedule your appointment, please contact [facility] - A DEPT OF [facility]  [phone] and follow the prompts.  Office hours are 8:00 a.m. to 4:30 p.m. Monday - Friday. Please note that voicemails left after 4:00 p.m. may not be returned until the following business day.  We are closed weekends and major holidays. You have access to a nurse at all times for urgent questions. Please call the main number to the clinic [phone] and follow the prompts.  For any non-urgent questions, you may also contact your provider using MyChart. We now offer e-Visits for anyone 99 and older to request care online for non-urgent symptoms. For details visit [email or web address].   Also download the MyChart app! Go to the app store, search MyChart, open the app, select [place], and log in with your MyChart username and password.

## 2023-12-24 NOTE — Telephone Encounter (Signed)
 As noted below by Dr. Timmy, I informed the patient that the urine is OK. Dr. Timmy would like for you to start a prophylactic antibiotic after this cycle of chemotherapy. In Dr.Ennever's absence, Lauraine Dais wrote for Cipro  500 mg po BID x 7 days. Prescription E[facility]scribed to [facility] in [place], [place]. Repeat U/A and C&S on Friday, July 11th. Orders placed, lab appointment made. Patient is aware of starting the antibiotic today or tomorrow.

## 2023-12-24 NOTE — Progress Notes (Signed)
 OK to run hydration fluids with Cisplatin per order of Dr. Myna Hidalgo.

## 2023-12-25 ENCOUNTER — Inpatient Hospital Stay

## 2023-12-25 ENCOUNTER — Other Ambulatory Visit: Payer: Self-pay | Admitting: *Deleted

## 2023-12-25 ENCOUNTER — Other Ambulatory Visit: Payer: Self-pay | Admitting: Family

## 2023-12-25 VITALS — BP 147/79 | HR 63 | Temp 97.9°F | Resp 18

## 2023-12-25 DIAGNOSIS — I440 Atrioventricular block, first degree: Secondary | ICD-10-CM

## 2023-12-25 DIAGNOSIS — Z1379 Encounter for other screening for genetic and chromosomal anomalies: Secondary | ICD-10-CM

## 2023-12-25 DIAGNOSIS — E871 Hypo-osmolality and hyponatremia: Secondary | ICD-10-CM

## 2023-12-25 DIAGNOSIS — R0789 Other chest pain: Secondary | ICD-10-CM

## 2023-12-25 DIAGNOSIS — R319 Hematuria, unspecified: Secondary | ICD-10-CM

## 2023-12-25 DIAGNOSIS — Z800 Family history of malignant neoplasm of digestive organs: Secondary | ICD-10-CM

## 2023-12-25 DIAGNOSIS — R252 Cramp and spasm: Secondary | ICD-10-CM

## 2023-12-25 DIAGNOSIS — D649 Anemia, unspecified: Secondary | ICD-10-CM

## 2023-12-25 DIAGNOSIS — C7802 Secondary malignant neoplasm of left lung: Secondary | ICD-10-CM

## 2023-12-25 DIAGNOSIS — Z803 Family history of malignant neoplasm of breast: Secondary | ICD-10-CM

## 2023-12-25 DIAGNOSIS — C6291 Malignant neoplasm of right testis, unspecified whether descended or undescended: Secondary | ICD-10-CM

## 2023-12-25 DIAGNOSIS — G5793 Unspecified mononeuropathy of bilateral lower limbs: Secondary | ICD-10-CM

## 2023-12-25 DIAGNOSIS — I8290 Acute embolism and thrombosis of unspecified vein: Secondary | ICD-10-CM

## 2023-12-25 DIAGNOSIS — Z5111 Encounter for antineoplastic chemotherapy: Secondary | ICD-10-CM | POA: Diagnosis not present

## 2023-12-25 LAB — URINALYSIS, COMPLETE (UACMP) WITH MICROSCOPIC
Bilirubin Urine: NEGATIVE
Glucose, UA: NEGATIVE mg/dL
Ketones, ur: NEGATIVE mg/dL
Nitrite: NEGATIVE
Protein, ur: 30 mg/dL — AB
Specific Gravity, Urine: 1.02 (ref 1.005–1.030)
pH: 7.5 (ref 5.0–8.0)

## 2023-12-25 LAB — CMP (CANCER CENTER ONLY)
ALT: 25 U/L (ref 0–44)
AST: 25 U/L (ref 15–41)
Albumin: 4.2 g/dL (ref 3.5–5.0)
Alkaline Phosphatase: 102 U/L (ref 38–126)
Anion gap: 6 (ref 5–15)
BUN: 18 mg/dL (ref 6–20)
CO2: 28 mmol/L (ref 22–32)
Calcium: 8.8 mg/dL — ABNORMAL LOW (ref 8.9–10.3)
Chloride: 101 mmol/L (ref 98–111)
Creatinine: 1.06 mg/dL (ref 0.61–1.24)
GFR, Estimated: >60 mL/min (ref >60)
Glucose, Bld: 90 mg/dL (ref 70–99)
Potassium: 3.8 mmol/L (ref 3.5–5.1)
Sodium: 135 mmol/L (ref 135–145)
Total Bilirubin: 0.5 mg/dL (ref 0.0–1.2)
Total Protein: 7.1 g/dL (ref 6.5–8.1)

## 2023-12-25 LAB — MAGNESIUM: Magnesium: 3.1 mg/dL — ABNORMAL HIGH (ref 1.7–2.4)

## 2023-12-25 MED ORDER — SODIUM CHLORIDE 0.9 % IV SOLN
25.0000 mg/m2 | Freq: Once | INTRAVENOUS | Status: AC
  Administered 2023-12-25: 50 mg via INTRAVENOUS
  Filled 2023-12-25: qty 50

## 2023-12-25 MED ORDER — SODIUM CHLORIDE 0.9 % IV SOLN
Freq: Once | INTRAVENOUS | Status: AC
  Filled 2023-12-25: qty 65

## 2023-12-25 MED ORDER — MAGNESIUM SULFATE 2 GM/50ML IV SOLN
2.0000 g | Freq: Once | INTRAVENOUS | Status: AC
  Administered 2023-12-25: 2 g via INTRAVENOUS
  Filled 2023-12-25: qty 50

## 2023-12-25 MED ORDER — SODIUM CHLORIDE 0.9 % IV SOLN
300.0000 mg/m2 | Freq: Once | INTRAVENOUS | Status: AC
  Administered 2023-12-25: 650 mg via INTRAVENOUS
  Filled 2023-12-25: qty 6.5

## 2023-12-25 MED ORDER — DEXAMETHASONE SODIUM PHOSPHATE 10 MG/ML IJ SOLN
10.0000 mg | Freq: Once | INTRAMUSCULAR | Status: AC
  Administered 2023-12-25: 10 mg via INTRAVENOUS
  Filled 2023-12-25: qty 1

## 2023-12-25 MED ORDER — POTASSIUM CHLORIDE IN NACL 20-0.9 MEQ/L-% IV SOLN
Freq: Once | INTRAVENOUS | Status: AC
  Filled 2023-12-25: qty 1000

## 2023-12-25 MED ORDER — PALONOSETRON HCL INJECTION 0.25 MG/5ML
0.2500 mg | Freq: Once | INTRAVENOUS | Status: AC
  Administered 2023-12-25: 0.25 mg via INTRAVENOUS
  Filled 2023-12-25: qty 5

## 2023-12-25 MED ORDER — SODIUM CHLORIDE 0.9 % IV SOLN
150.0000 mg | Freq: Once | INTRAVENOUS | Status: AC
  Administered 2023-12-25: 150 mg via INTRAVENOUS
  Filled 2023-12-25: qty 150

## 2023-12-25 MED ORDER — SODIUM CHLORIDE 0.9 % IV SOLN: INTRAVENOUS | Status: DC

## 2023-12-25 MED ORDER — SODIUM CHLORIDE 0.9% FLUSH
10.0000 mL | Freq: Once | INTRAVENOUS | Status: AC
  Administered 2023-12-25: 10 mL via INTRAVENOUS

## 2023-12-25 MED ORDER — HEPARIN SOD (PORK) LOCK FLUSH 100 UNIT/ML IV SOLN
500.0000 [IU] | Freq: Once | INTRAVENOUS | Status: AC
  Administered 2023-12-25: 500 [IU] via INTRAVENOUS

## 2023-12-25 MED ORDER — GABAPENTIN 300 MG PO CAPS: 300.0000 mg | ORAL_CAPSULE | Freq: Every day | ORAL | 0 refills | Status: DC

## 2023-12-25 NOTE — Progress Notes (Signed)
 Ok to run cisplatin  with post hydration fluids per Dr. Timmy

## 2023-12-26 LAB — URINE CULTURE: Culture: <10000 — AB

## 2023-12-26 NOTE — Anesthesia Postprocedure Evaluation (Signed)
 Anesthesia Post Note  Patient: Daniel Shepherd  Procedure(s) Performed: CYSTOSCOPY/URETEROSCOPY/STENT PLACEMENT (Right) CYSTOSCOPY, WITH BILATERAL RETROGRADE PYELOGRAM (Bilateral) CYSTOSCOPY, WITH BIOPSY     Patient location during evaluation: PACU Anesthesia Type: General Level of consciousness: awake and alert Pain management: pain level controlled Vital Signs Assessment: post-procedure vital signs reviewed and stable Respiratory status: spontaneous breathing, nonlabored ventilation, respiratory function stable and patient connected to nasal cannula oxygen Cardiovascular status: blood pressure returned to baseline and stable Postop Assessment: no apparent nausea or vomiting Anesthetic complications: no   No notable events documented.  Last Vitals:  Vitals:   12/16/23 1845 12/16/23 1902  BP: (!) 150/86 (!) 150/88  Pulse: (!) 49 (!) 50  Resp: 15 15  Temp:  (!) 36.4 C  SpO2: 100% 100%    Last Pain:  Vitals:   12/16/23 1902  TempSrc:   PainSc: 0-No pain   Pain Goal: Patients Stated Pain Goal: 5 (12/16/23 1428)                 Traeh Milroy L Byrd Terrero

## 2023-12-28 ENCOUNTER — Ambulatory Visit: Payer: Self-pay | Admitting: Medical Oncology

## 2023-12-28 ENCOUNTER — Encounter: Payer: Self-pay | Admitting: Hematology & Oncology

## 2023-12-28 ENCOUNTER — Inpatient Hospital Stay

## 2023-12-28 VITALS — BP 142/64 | HR 88 | Temp 98.1°F | Resp 20

## 2023-12-28 DIAGNOSIS — Z5111 Encounter for antineoplastic chemotherapy: Secondary | ICD-10-CM | POA: Diagnosis not present

## 2023-12-28 DIAGNOSIS — C6291 Malignant neoplasm of right testis, unspecified whether descended or undescended: Secondary | ICD-10-CM

## 2023-12-28 MED ORDER — PEGFILGRASTIM-CBQV 6 MG/0.6ML ~~LOC~~ SOSY
6.0000 mg | PREFILLED_SYRINGE | Freq: Once | SUBCUTANEOUS | Status: AC
  Administered 2023-12-28: 6 mg via SUBCUTANEOUS
  Filled 2023-12-28: qty 0.6

## 2023-12-28 NOTE — Patient Instructions (Signed)

## 2024-01-01 ENCOUNTER — Telehealth: Payer: Self-pay | Admitting: *Deleted

## 2024-01-01 ENCOUNTER — Ambulatory Visit (HOSPITAL_BASED_OUTPATIENT_CLINIC_OR_DEPARTMENT_OTHER)
Admission: RE | Admit: 2024-01-01 | Discharge: 2024-01-01 | Disposition: A | Discharge status: Home / Self Care | Source: Ambulatory Visit | Attending: Family | Admitting: Family

## 2024-01-01 ENCOUNTER — Encounter (HOSPITAL_BASED_OUTPATIENT_CLINIC_OR_DEPARTMENT_OTHER): Payer: Self-pay

## 2024-01-01 DIAGNOSIS — C801 Malignant (primary) neoplasm, unspecified: Secondary | ICD-10-CM | POA: Insufficient documentation

## 2024-01-01 DIAGNOSIS — C6291 Malignant neoplasm of right testis, unspecified whether descended or undescended: Secondary | ICD-10-CM | POA: Diagnosis present

## 2024-01-01 DIAGNOSIS — C7802 Secondary malignant neoplasm of left lung: Secondary | ICD-10-CM | POA: Insufficient documentation

## 2024-01-01 MED ORDER — IOHEXOL 300 MG/ML  SOLN
100.0000 mL | Freq: Once | INTRAMUSCULAR | Status: AC | PRN
  Administered 2024-01-01: 100 mL via INTRAVENOUS

## 2024-01-01 NOTE — Telephone Encounter (Signed)
 Fax received from on call providers stating that pt.'s mother, Daniel Shepherd called in with concerns of pt.'s HR of 130 and BP-160/80 yesterday evening.  Call placed to Daniel Shepherd and Daniel Shepherd states that she went to check on Daniel Shepherd last night and that Daniel Shepherd took a Losartan  on her arrival to his house and before she left  pt.'s HR and BP were down.  Daniel Shepherd states that feels as though the increase of HR was d/t pt.'s increased anxiety.  Call then placed to Daniel Shepherd to check on his status and he states that he is feeling much better today and that his HR and BP are WNL.  Dr. Timmy notified.

## 2024-01-04 ENCOUNTER — Telehealth: Payer: Self-pay | Admitting: *Deleted

## 2024-01-04 NOTE — Telephone Encounter (Signed)
 Corion called stating that [facility] called wanting to make appointments for further testing on Thursday and asked if results of CT scan is back yet.  Inquired about next weeks appointments for chemo. Told Ted I would keep them for now until get results back from the scans.  Patient in agreement.

## 2024-01-05 ENCOUNTER — Encounter: Payer: Self-pay | Admitting: *Deleted

## 2024-01-05 NOTE — Progress Notes (Signed)
 Reviewed PET which shows progressive disease. Dr Timmy has a call out to Dr Darra. Transplant was only possible if patient responded to TIP. Unsure of next steps at this time.   Oncology Nurse Navigator Documentation     01/05/2024    1:00 PM  Oncology Nurse Navigator Flowsheets  Navigator Follow Up Date: 01/11/2024  Navigator Follow Up Reason: Follow-up Appointment  Navigator Location [facility]  Navigator Encounter Type Scan Review  Patient Visit Type MedOnc  Treatment Phase Active Tx  Barriers/Navigation Needs Coordination of Care;Education  Interventions None Required  Acuity Level 1-No Barriers  Support Groups/Services Friends and Family  Time Spent with Patient 15

## 2024-01-11 ENCOUNTER — Inpatient Hospital Stay

## 2024-01-11 ENCOUNTER — Inpatient Hospital Stay: Admitting: Hematology & Oncology

## 2024-01-11 ENCOUNTER — Other Ambulatory Visit: Payer: Self-pay

## 2024-01-11 DIAGNOSIS — C6291 Malignant neoplasm of right testis, unspecified whether descended or undescended: Secondary | ICD-10-CM

## 2024-01-12 ENCOUNTER — Other Ambulatory Visit: Payer: Self-pay

## 2024-01-12 ENCOUNTER — Inpatient Hospital Stay

## 2024-01-13 ENCOUNTER — Inpatient Hospital Stay

## 2024-01-14 ENCOUNTER — Inpatient Hospital Stay

## 2024-01-15 ENCOUNTER — Inpatient Hospital Stay

## 2024-01-18 ENCOUNTER — Inpatient Hospital Stay

## 2024-01-18 ENCOUNTER — Encounter: Payer: Self-pay | Admitting: Hematology & Oncology

## 2024-01-18 ENCOUNTER — Inpatient Hospital Stay: Attending: Hematology & Oncology

## 2024-01-18 ENCOUNTER — Inpatient Hospital Stay (HOSPITAL_BASED_OUTPATIENT_CLINIC_OR_DEPARTMENT_OTHER): Admitting: Hematology & Oncology

## 2024-01-18 VITALS — BP 140/76 | HR 71 | Temp 98.6°F | Resp 20 | Ht 73.0 in | Wt 206.0 lb

## 2024-01-18 DIAGNOSIS — C7800 Secondary malignant neoplasm of unspecified lung: Secondary | ICD-10-CM | POA: Insufficient documentation

## 2024-01-18 DIAGNOSIS — C6291 Malignant neoplasm of right testis, unspecified whether descended or undescended: Secondary | ICD-10-CM | POA: Insufficient documentation

## 2024-01-18 DIAGNOSIS — Z5189 Encounter for other specified aftercare: Secondary | ICD-10-CM | POA: Insufficient documentation

## 2024-01-18 DIAGNOSIS — Z5111 Encounter for antineoplastic chemotherapy: Secondary | ICD-10-CM | POA: Insufficient documentation

## 2024-01-18 DIAGNOSIS — C7802 Secondary malignant neoplasm of left lung: Secondary | ICD-10-CM

## 2024-01-18 LAB — CBC WITH DIFFERENTIAL (CANCER CENTER ONLY)
Abs Immature Granulocytes: 0.06 K/uL (ref 0.00–0.07)
Basophils Absolute: 0.1 K/uL (ref 0.0–0.1)
Basophils Relative: 1 %
Eosinophils Absolute: 0 K/uL (ref 0.0–0.5)
Eosinophils Relative: 1 %
HCT: 35.9 % — ABNORMAL LOW (ref 39.0–52.0)
Hemoglobin: 11.8 g/dL — ABNORMAL LOW (ref 13.0–17.0)
Immature Granulocytes: 1 %
Lymphocytes Relative: 16 %
Lymphs Abs: 1 K/uL (ref 0.7–4.0)
MCH: 29.8 pg (ref 26.0–34.0)
MCHC: 32.9 g/dL (ref 30.0–36.0)
MCV: 90.7 fL (ref 80.0–100.0)
Monocytes Absolute: 0.6 K/uL (ref 0.1–1.0)
Monocytes Relative: 9 %
Neutro Abs: 4.9 K/uL (ref 1.7–7.7)
Neutrophils Relative %: 72 %
Platelet Count: 222 K/uL (ref 150–400)
RBC: 3.96 MIL/uL — ABNORMAL LOW (ref 4.22–5.81)
RDW: 14.6 % (ref 11.5–15.5)
WBC Count: 6.6 K/uL (ref 4.0–10.5)
nRBC: 0 % (ref 0.0–0.2)

## 2024-01-18 LAB — CMP (CANCER CENTER ONLY)
ALT: 17 U/L (ref 0–44)
AST: 22 U/L (ref 15–41)
Albumin: 4.3 g/dL (ref 3.5–5.0)
Alkaline Phosphatase: 86 U/L (ref 38–126)
Anion gap: 12 (ref 5–15)
BUN: 27 mg/dL — ABNORMAL HIGH (ref 6–20)
CO2: 25 mmol/L (ref 22–32)
Calcium: 9.4 mg/dL (ref 8.9–10.3)
Chloride: 103 mmol/L (ref 98–111)
Creatinine: 1.1 mg/dL (ref 0.61–1.24)
GFR, Estimated: >60 mL/min (ref >60)
Glucose, Bld: 101 mg/dL — ABNORMAL HIGH (ref 70–99)
Potassium: 4.4 mmol/L (ref 3.5–5.1)
Sodium: 140 mmol/L (ref 135–145)
Total Bilirubin: 0.3 mg/dL (ref 0.0–1.2)
Total Protein: 7.3 g/dL (ref 6.5–8.1)

## 2024-01-18 LAB — MAGNESIUM: Magnesium: 2.3 mg/dL (ref 1.7–2.4)

## 2024-01-18 LAB — LACTATE DEHYDROGENASE: LDH: 188 U/L (ref 98–192)

## 2024-01-18 MED ORDER — SODIUM CHLORIDE 0.9 % IV SOLN: INTRAVENOUS | Status: AC

## 2024-01-18 MED ORDER — DIPHENHYDRAMINE HCL 50 MG/ML IJ SOLN
50.0000 mg | Freq: Once | INTRAMUSCULAR | Status: AC
  Administered 2024-01-18: 50 mg via INTRAVENOUS
  Filled 2024-01-18: qty 1

## 2024-01-18 MED ORDER — SODIUM CHLORIDE 0.9% FLUSH: 10.0000 mL | INTRAVENOUS | Status: AC | PRN

## 2024-01-18 MED ORDER — SODIUM CHLORIDE 0.9 % IV SOLN
20.0000 mg | Freq: Once | INTRAVENOUS | Status: AC
  Administered 2024-01-18: 20 mg via INTRAVENOUS
  Filled 2024-01-18: qty 20

## 2024-01-18 MED ORDER — FAMOTIDINE IN NACL 20-0.9 MG/50ML-% IV SOLN
20.0000 mg | Freq: Once | INTRAVENOUS | Status: AC
  Administered 2024-01-18: 20 mg via INTRAVENOUS
  Filled 2024-01-18: qty 50

## 2024-01-18 MED ORDER — HEPARIN SOD (PORK) LOCK FLUSH 100 UNIT/ML IV SOLN: 500.0000 [IU] | Freq: Once | INTRAVENOUS | Status: AC | PRN

## 2024-01-18 MED ORDER — ONDANSETRON HCL 4 MG/2ML IJ SOLN
8.0000 mg | Freq: Once | INTRAMUSCULAR | Status: AC
  Administered 2024-01-18: 8 mg via INTRAVENOUS
  Filled 2024-01-18: qty 4

## 2024-01-18 MED ORDER — SODIUM CHLORIDE 0.9 % IV SOLN
200.0000 mg/m2 | Freq: Once | INTRAVENOUS | Status: AC
  Administered 2024-01-18: 438 mg via INTRAVENOUS
  Filled 2024-01-18: qty 73

## 2024-01-18 NOTE — Progress Notes (Signed)
 Hematology and Oncology Follow Up Visit  Daniel Shepherd [id number] 08-11-1995 28 y.o. 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 twice daily -started on 07/13/2023 TIP -- start cycle #1 on 12/21/2023                                   Interim History:  [name] is here today for follow-up.  He is doing okay with the first cycle of chemotherapy.  I think he may have had some slight mental status changes which were temporary.  He has no pain in the right lower quadrant of his abdomen now.  We did his last scan, there is to be a little bit of growth of the tumor.  However, there was a long interval between that scan and with the previous scan was done in May.  He is being seen at [facility].  He was seen at [facility] recently.  I think his tumor markers were done at [facility].  I am not sure what the results were.  However, [facility] wants to proceed with high-dose chemotherapy with stem cell transplant.  I cannot argue with this.  He has had no problems with bowels or bladder.  He has had no cough or shortness of breath.  He has had no headache.  He has had no leg swelling.  He has had a little bit of neuropathy.  He is on gabapentin  right now.  He has had no fever.  He has had no bleeding.  His appetite has been okay.  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  Overall, I would say his performance status is ECOG 0.   Medications:  Allergies as of 01/18/2024       Reactions   Bee Venom Anaphylaxis   Other Anaphylaxis, Swelling   Bee allergy        Medication List        Accurate as of  January 18, 2024  8:32 AM. If you have any questions, ask your nurse or doctor.          STOP taking these medications    oxyCODONE -acetaminophen  5-325 MG tablet Commonly known as: Percocet Stopped by: Neel Buffone R Biance Moncrief       TAKE these medications    apixaban  2.5 MG Tabs tablet Commonly known as: ELIQUIS  Take 1 tablet (2.5 mg total) by mouth 2 (two) times daily.   ciprofloxacin  500 MG tablet Commonly known as: Cipro  Take 1 tablet (500 mg total) by mouth 2 (two) times daily.   gabapentin  300 MG capsule Commonly known as: NEURONTIN  Take 1 capsule (300 mg total) by mouth at bedtime.   losartan  25 MG tablet Commonly known as: COZAAR  Take 12.5 mg by mouth daily.   multivitamin tablet Take 1 tablet by mouth in the morning.   Nivestym 480 MCG/0.8ML Sosy injection Generic drug: filgrastim-aafi Inject 960 mcg into the skin.   OLANZapine  10 MG tablet Commonly known as: ZYPREXA  Take 1 tablet (10 mg total) by mouth at bedtime.   senna-docusate  8.6-50 MG tablet Commonly known as: Senokot-S Take 1 tablet by mouth 2 (two) times daily. While taking strong pain meds to prevent constipation.   testosterone cypionate 200 MG/ML injection Commonly known as: DEPOTESTOSTERONE CYPIONATE Inject 100 mg into the muscle every Sunday.   ursodiol 300 MG capsule Commonly known as: ACTIGALL Take 1 capsule by mouth three times a day with food.        Allergies:  Allergies  Allergen Reactions   Bee Venom Anaphylaxis   Other Anaphylaxis and Swelling    Bee allergy    Past Medical History, Surgical history, Social history, and Family History were reviewed and updated.  Review of Systems: Review of Systems  Constitutional: Negative.   HENT: Negative.    Eyes: Negative.   Respiratory: Negative.    Cardiovascular: Negative.   Gastrointestinal: Negative.   Genitourinary: Negative.   Musculoskeletal: Negative.   Skin: Negative.   Neurological: Negative.   Endo/Heme/Allergies:  Negative.   Psychiatric/Behavioral: Negative.       Physical Exam:  height is 6' 1 (1.854 m) and weight is 206 lb (93.4 kg). His oral temperature is 98.6 F (37 C). His blood pressure is 140/76 (abnormal) and his pulse is 71. His respiration is 20 and oxygen saturation is 100%.   Wt Readings from Last 3 Encounters:  01/18/24 206 lb (93.4 kg)  12/21/23 198 lb (89.8 kg)  12/16/23 200 lb (90.7 kg)    Physical Exam Vitals reviewed.  HENT:     Head: Normocephalic and atraumatic.  Eyes:     Pupils: Pupils are equal, round, and reactive to light.  Cardiovascular:     Rate and Rhythm: Normal rate and regular rhythm.     Heart sounds: Normal heart sounds.  Pulmonary:     Effort: Pulmonary effort is normal.     Breath sounds: Normal breath sounds.  Abdominal:     General: Bowel sounds are normal.     Palpations: Abdomen is soft.  Musculoskeletal:        General: No tenderness or deformity. Normal range of motion.     Cervical back: Normal range of motion.  Lymphadenopathy:     Cervical: No cervical adenopathy.  Skin:    General: Skin is warm and dry.     Findings: No erythema or rash.  Neurological:     Mental Status: He is alert and oriented to person, place, and time.  Psychiatric:        Behavior: Behavior normal.        Thought Content: Thought content normal.        Judgment: Judgment normal.      Lab Results  Component Value Date   WBC 6.1 12/21/2023   HGB 13.2 12/21/2023   HCT 39.2 12/21/2023   MCV 87.7 12/21/2023   PLT 283 12/21/2023   No results found for: FERRITIN, IRON, TIBC, UIBC, IRONPCTSAT Lab Results  Component Value Date   RBC 4.47 12/21/2023   No results found for: KPAFRELGTCHN, LAMBDASER, KAPLAMBRATIO No results found for: IGGSERUM, IGA, IGMSERUM No results found for: STEPHANY RINGS, A1GS, A2GS, EARLA JOANNIE DOC VICK, SPEI   Chemistry      Component Value Date/Time   NA 135 12/25/2023  1030   NA 141 10/29/2022 0931   K 3.8 12/25/2023 1030   CL 101 12/25/2023 1030   CO2 28 12/25/2023 1030   BUN 18 12/25/2023 1030   BUN 18 10/29/2022 0931   CREATININE 1.06 12/25/2023 1030  Component Value Date/Time   CALCIUM 8.8 (L) 12/25/2023 1030   ALKPHOS 102 12/25/2023 1030   AST 25 12/25/2023 1030   ALT 25 12/25/2023 1030   BILITOT 0.5 12/25/2023 1030       Impression and Plan: Mr. Plata is a 28 yo caucasian gentleman with metastatic embryonal cell carcinoma.  He had a left orchiectomy back on July 03, 2017.  A week later, he showed up in the emergency room with chest discomfort and some shortness of breath and had innumerable pulmonary nodules.  He was treated with systemic chemotherapy.  He had a complete response.   He then developed a second testicular cancer in the right testicle.  This was a pure seminoma, early-stage.   The seminoma then relapsed.  He had 3 cycles of chemotherapy with BEP.  He had an initial response and then relapsed again.   He is here today to started cycle 1 of TIP in the salvage setting.    Again, he will undergo high-dose chemotherapy and stem cell transplant at [facility].  I really really hope that we see his tumor markers improvement.  Apparently he will have his stem cells collected when they recover from this chemotherapy.  He has a Hickman catheter in.  This was done at [facility].  We will have to follow his labs closely.      Maude JONELLE Crease, MD 8/4/20258:32 AM

## 2024-01-19 ENCOUNTER — Inpatient Hospital Stay

## 2024-01-19 ENCOUNTER — Encounter: Payer: Self-pay | Admitting: Hematology & Oncology

## 2024-01-19 ENCOUNTER — Encounter: Payer: Self-pay | Admitting: *Deleted

## 2024-01-19 VITALS — BP 155/92 | HR 90 | Temp 98.0°F | Resp 18

## 2024-01-19 DIAGNOSIS — C6291 Malignant neoplasm of right testis, unspecified whether descended or undescended: Secondary | ICD-10-CM

## 2024-01-19 DIAGNOSIS — Z5111 Encounter for antineoplastic chemotherapy: Secondary | ICD-10-CM | POA: Diagnosis not present

## 2024-01-19 LAB — URINALYSIS, COMPLETE (UACMP) WITH MICROSCOPIC
Bilirubin Urine: NEGATIVE
Glucose, UA: NEGATIVE mg/dL
Ketones, ur: NEGATIVE mg/dL
Nitrite: NEGATIVE
Protein, ur: 30 mg/dL — AB
Specific Gravity, Urine: 1.015 (ref 1.005–1.030)
pH: 6 (ref 5.0–8.0)

## 2024-01-19 LAB — BETA HCG QUANT (REF LAB): hCG Quant: <1 m[IU]/mL (ref 0–3)

## 2024-01-19 LAB — AFP TUMOR MARKER: AFP, Serum, Tumor Marker: 5.9 ng/mL — ABNORMAL HIGH (ref 0.0–5.7)

## 2024-01-19 MED ORDER — SODIUM CHLORIDE 0.9 % IV SOLN: INTRAVENOUS | Status: DC

## 2024-01-19 MED ORDER — PALONOSETRON HCL INJECTION 0.25 MG/5ML
0.2500 mg | Freq: Once | INTRAVENOUS | Status: AC
  Administered 2024-01-19: 0.25 mg via INTRAVENOUS
  Filled 2024-01-19: qty 5

## 2024-01-19 MED ORDER — SODIUM CHLORIDE 0.9 % IV SOLN
300.0000 mg/m2 | Freq: Once | INTRAVENOUS | Status: AC
  Administered 2024-01-19: 650 mg via INTRAVENOUS
  Filled 2024-01-19: qty 6.5

## 2024-01-19 MED ORDER — DEXAMETHASONE SODIUM PHOSPHATE 10 MG/ML IJ SOLN
10.0000 mg | Freq: Once | INTRAMUSCULAR | Status: AC
  Administered 2024-01-19: 10 mg via INTRAVENOUS
  Filled 2024-01-19: qty 1

## 2024-01-19 MED ORDER — SODIUM CHLORIDE 0.9 % IV SOLN
Freq: Once | INTRAVENOUS | Status: DC
  Filled 2024-01-19: qty 5

## 2024-01-19 MED ORDER — SODIUM CHLORIDE 0.9 % IV SOLN
150.0000 mg | Freq: Once | INTRAVENOUS | Status: AC
  Administered 2024-01-19 (×2): 150 mg via INTRAVENOUS
  Filled 2024-01-19: qty 150

## 2024-01-19 MED ORDER — SODIUM CHLORIDE 0.9 % IV SOLN
Freq: Once | INTRAVENOUS | Status: AC
  Filled 2024-01-19: qty 65

## 2024-01-19 MED ORDER — POTASSIUM CHLORIDE IN NACL 20-0.9 MEQ/L-% IV SOLN
Freq: Once | INTRAVENOUS | Status: AC
  Filled 2024-01-19: qty 1000

## 2024-01-19 MED ORDER — SODIUM CHLORIDE 0.9 % IV SOLN
25.0000 mg/m2 | Freq: Once | INTRAVENOUS | Status: AC
  Administered 2024-01-19: 50 mg via INTRAVENOUS
  Filled 2024-01-19: qty 50

## 2024-01-19 MED ORDER — MAGNESIUM SULFATE 2 GM/50ML IV SOLN
2.0000 g | Freq: Once | INTRAVENOUS | Status: AC
  Administered 2024-01-19: 2 g via INTRAVENOUS
  Filled 2024-01-19: qty 50

## 2024-01-19 NOTE — Progress Notes (Addendum)
 Neuro assessment wnl. DC further UA for days 3 - 5.  Bridgett Leach [place], [place], BCPS, BCOP 01/19/2024 11:48 AM

## 2024-01-19 NOTE — Progress Notes (Signed)
 Plan to proceed with transplant. He will receive cycle two of TIP here and then continue with [facility] until ready for return to local care.   Oncology Nurse Navigator Documentation     01/19/2024    7:45 AM  Oncology Nurse Navigator Flowsheets  Navigator Follow Up Date: 02/11/2024  Navigator Follow Up Reason: Appointment Review  Navigator Location [facility]  Navigator Encounter Type Appt/Treatment Plan Review  Patient Visit Type MedOnc  Treatment Phase Active Tx  Barriers/Navigation Needs Coordination of Care;Education  Interventions None Required  Acuity Level 1-No Barriers  Support Groups/Services Friends and Family  Time Spent with Patient 15

## 2024-01-19 NOTE — Patient Instructions (Signed)
 [facility] - A DEPT OF [place]  Discharge Instructions: Thank you for choosing [facility] to provide your oncology and hematology care.   If you have a lab appointment with the Cancer Center, please go directly to the Cancer Center and check in at the registration area.  Wear comfortable clothing and clothing appropriate for easy access to any Portacath or PICC line.   We strive to give you quality time with your provider. You may need to reschedule your appointment if you arrive late (15 or more minutes).  Arriving late affects you and other patients whose appointments are after yours.  Also, if you miss three or more appointments without notifying the office, you may be dismissed from the clinic at the provider's discretion.      For prescription refill requests, have your pharmacy contact our office and allow 72 hours for refills to be completed.    Today you received the following chemotherapy and/or immunotherapy agents CIsplatin /Mesna /Ifex       To help prevent nausea and vomiting after your treatment, we encourage you to take your nausea medication as directed.  BELOW ARE SYMPTOMS THAT SHOULD BE REPORTED IMMEDIATELY: *FEVER GREATER THAN 100.4 F (38 C) OR HIGHER *CHILLS OR SWEATING *NAUSEA AND VOMITING THAT IS NOT CONTROLLED WITH YOUR NAUSEA MEDICATION *UNUSUAL SHORTNESS OF BREATH *UNUSUAL BRUISING OR BLEEDING *URINARY PROBLEMS (pain or burning when urinating, or frequent urination) *BOWEL PROBLEMS (unusual diarrhea, constipation, pain near the anus) TENDERNESS IN MOUTH AND THROAT WITH OR WITHOUT PRESENCE OF ULCERS (sore throat, sores in mouth, or a toothache) UNUSUAL RASH, SWELLING OR PAIN  UNUSUAL VAGINAL DISCHARGE OR ITCHING   Items with * indicate a potential emergency and should be followed up as soon as possible or go to the Emergency Department if any problems should occur.  Please show the CHEMOTHERAPY ALERT CARD or  IMMUNOTHERAPY ALERT CARD at check-in to the Emergency Department and triage nurse. Should you have questions after your visit or need to cancel or reschedule your appointment, please contact [facility] - A DEPT OF [facility]  [phone] and follow the prompts.  Office hours are 8:00 a.m. to 4:30 p.m. Monday - Friday. Please note that voicemails left after 4:00 p.m. may not be returned until the following business day.  We are closed weekends and major holidays. You have access to a nurse at all times for urgent questions. Please call the main number to the clinic [phone] and follow the prompts.  For any non-urgent questions, you may also contact your provider using MyChart. We now offer e-Visits for anyone 33 and older to request care online for non-urgent symptoms. For details visit [email or web address].   Also download the MyChart app! Go to the app store, search MyChart, open the app, select [place], and log in with your MyChart username and password.

## 2024-01-19 NOTE — Progress Notes (Signed)
 OK to treat with today's U/A per order of Dr. Timmy.  Order received to add urine culture to today's U/A. Order placed.

## 2024-01-20 ENCOUNTER — Inpatient Hospital Stay

## 2024-01-20 ENCOUNTER — Inpatient Hospital Stay: Admitting: Licensed Clinical Social Worker

## 2024-01-20 VITALS — BP 153/78 | HR 69 | Temp 97.7°F | Resp 20

## 2024-01-20 DIAGNOSIS — C6291 Malignant neoplasm of right testis, unspecified whether descended or undescended: Secondary | ICD-10-CM

## 2024-01-20 DIAGNOSIS — Z5111 Encounter for antineoplastic chemotherapy: Secondary | ICD-10-CM | POA: Diagnosis not present

## 2024-01-20 LAB — URINE CULTURE: Culture: <10000 — AB

## 2024-01-20 MED ORDER — SODIUM CHLORIDE 0.9 % IV SOLN
300.0000 mg/m2 | Freq: Once | INTRAVENOUS | Status: AC
  Administered 2024-01-20: 650 mg via INTRAVENOUS
  Filled 2024-01-20: qty 6.5

## 2024-01-20 MED ORDER — DEXAMETHASONE SODIUM PHOSPHATE 10 MG/ML IJ SOLN
10.0000 mg | Freq: Once | INTRAMUSCULAR | Status: AC
  Administered 2024-01-20: 10 mg via INTRAVENOUS
  Filled 2024-01-20: qty 1

## 2024-01-20 MED ORDER — POTASSIUM CHLORIDE IN NACL 20-0.9 MEQ/L-% IV SOLN
Freq: Once | INTRAVENOUS | Status: AC
  Filled 2024-01-20: qty 1000

## 2024-01-20 MED ORDER — SODIUM CHLORIDE 0.9 % IV SOLN
Freq: Once | INTRAVENOUS | Status: AC
  Filled 2024-01-20: qty 60

## 2024-01-20 MED ORDER — MAGNESIUM SULFATE 2 GM/50ML IV SOLN
2.0000 g | Freq: Once | INTRAVENOUS | Status: AC
  Administered 2024-01-20: 2 g via INTRAVENOUS
  Filled 2024-01-20: qty 50

## 2024-01-20 MED ORDER — SODIUM CHLORIDE 0.9 % IV SOLN
25.0000 mg/m2 | Freq: Once | INTRAVENOUS | Status: AC
  Administered 2024-01-20: 50 mg via INTRAVENOUS
  Filled 2024-01-20: qty 50

## 2024-01-20 MED ORDER — SODIUM CHLORIDE 0.9 % IV SOLN: Freq: Once | INTRAVENOUS | Status: AC

## 2024-01-20 NOTE — Patient Instructions (Signed)
 [facility] - A DEPT OF [place]  Discharge Instructions: Thank you for choosing [facility] to provide your oncology and hematology care.   If you have a lab appointment with the Cancer Center, please go directly to the Cancer Center and check in at the registration area.  Wear comfortable clothing and clothing appropriate for easy access to any Portacath or PICC line.   We strive to give you quality time with your provider. You may need to reschedule your appointment if you arrive late (15 or more minutes).  Arriving late affects you and other patients whose appointments are after yours.  Also, if you miss three or more appointments without notifying the office, you may be dismissed from the clinic at the provider's discretion.      For prescription refill requests, have your pharmacy contact our office and allow 72 hours for refills to be completed.    Today you received the following chemotherapy and/or immunotherapy agents ifos, mesna , cisplatin      To help prevent nausea and vomiting after your treatment, we encourage you to take your nausea medication as directed.  BELOW ARE SYMPTOMS THAT SHOULD BE REPORTED IMMEDIATELY: *FEVER GREATER THAN 100.4 F (38 C) OR HIGHER *CHILLS OR SWEATING *NAUSEA AND VOMITING THAT IS NOT CONTROLLED WITH YOUR NAUSEA MEDICATION *UNUSUAL SHORTNESS OF BREATH *UNUSUAL BRUISING OR BLEEDING *URINARY PROBLEMS (pain or burning when urinating, or frequent urination) *BOWEL PROBLEMS (unusual diarrhea, constipation, pain near the anus) TENDERNESS IN MOUTH AND THROAT WITH OR WITHOUT PRESENCE OF ULCERS (sore throat, sores in mouth, or a toothache) UNUSUAL RASH, SWELLING OR PAIN  UNUSUAL VAGINAL DISCHARGE OR ITCHING   Items with * indicate a potential emergency and should be followed up as soon as possible or go to the Emergency Department if any problems should occur.  Please show the CHEMOTHERAPY ALERT CARD or  IMMUNOTHERAPY ALERT CARD at check-in to the Emergency Department and triage nurse. Should you have questions after your visit or need to cancel or reschedule your appointment, please contact [facility] - A DEPT OF [facility]  [phone] and follow the prompts.  Office hours are 8:00 a.m. to 4:30 p.m. Monday - Friday. Please note that voicemails left after 4:00 p.m. may not be returned until the following business day.  We are closed weekends and major holidays. You have access to a nurse at all times for urgent questions. Please call the main number to the clinic [phone] and follow the prompts.  For any non-urgent questions, you may also contact your provider using MyChart. We now offer e-Visits for anyone 66 and older to request care online for non-urgent symptoms. For details visit [email or web address].   Also download the MyChart app! Go to the app store, search MyChart, open the app, select [place], and log in with your MyChart username and password.

## 2024-01-20 NOTE — Progress Notes (Signed)
 [facility] CSW Progress Note  Visual merchandiser met with patient in infusion to follow-up on emotional support.    Interventions: Patient interviewed and appropriate assessments performed: brief mental health assessment .  Patient was resting comfortably.  He engaged in conversation and displayed a bright affect.  Patient expressed no needs at this time.      Follow Up Plan:  CSW will follow-up with patient by phone     Macario CHRISTELLA Au, LCSW Clinical Social Worker [facility]    Patient is participating in a Managed Medicaid Plan:  Yes

## 2024-01-21 ENCOUNTER — Inpatient Hospital Stay

## 2024-01-21 ENCOUNTER — Other Ambulatory Visit: Payer: Self-pay | Admitting: Family

## 2024-01-21 VITALS — BP 151/89 | HR 89 | Temp 97.8°F | Resp 17

## 2024-01-21 DIAGNOSIS — C6291 Malignant neoplasm of right testis, unspecified whether descended or undescended: Secondary | ICD-10-CM

## 2024-01-21 DIAGNOSIS — G5793 Unspecified mononeuropathy of bilateral lower limbs: Secondary | ICD-10-CM

## 2024-01-21 DIAGNOSIS — C7802 Secondary malignant neoplasm of left lung: Secondary | ICD-10-CM

## 2024-01-21 DIAGNOSIS — Z5111 Encounter for antineoplastic chemotherapy: Secondary | ICD-10-CM | POA: Diagnosis not present

## 2024-01-21 MED ORDER — SODIUM CHLORIDE 0.9 % IV SOLN
Freq: Once | INTRAVENOUS | Status: AC
  Filled 2024-01-21: qty 60

## 2024-01-21 MED ORDER — MAGNESIUM SULFATE 2 GM/50ML IV SOLN
2.0000 g | Freq: Once | INTRAVENOUS | Status: AC
  Administered 2024-01-21: 2 g via INTRAVENOUS
  Filled 2024-01-21: qty 50

## 2024-01-21 MED ORDER — PROCHLORPERAZINE MALEATE 10 MG PO TABS
10.0000 mg | ORAL_TABLET | Freq: Once | ORAL | Status: AC
  Administered 2024-01-21: 10 mg via ORAL
  Filled 2024-01-21: qty 1

## 2024-01-21 MED ORDER — ONDANSETRON HCL 4 MG/2ML IJ SOLN
8.0000 mg | Freq: Once | INTRAMUSCULAR | Status: AC
  Administered 2024-01-21: 8 mg via INTRAVENOUS
  Filled 2024-01-21: qty 4

## 2024-01-21 MED ORDER — DEXAMETHASONE SODIUM PHOSPHATE 10 MG/ML IJ SOLN
10.0000 mg | Freq: Once | INTRAMUSCULAR | Status: AC
  Administered 2024-01-21: 10 mg via INTRAVENOUS
  Filled 2024-01-21: qty 1

## 2024-01-21 MED ORDER — POTASSIUM CHLORIDE IN NACL 20-0.9 MEQ/L-% IV SOLN
Freq: Once | INTRAVENOUS | Status: AC
  Filled 2024-01-21: qty 1000

## 2024-01-21 MED ORDER — SODIUM CHLORIDE 0.9 % IV SOLN
25.0000 mg/m2 | Freq: Once | INTRAVENOUS | Status: AC
  Administered 2024-01-21: 50 mg via INTRAVENOUS
  Filled 2024-01-21: qty 50

## 2024-01-21 MED ORDER — SODIUM CHLORIDE 0.9 % IV SOLN
300.0000 mg/m2 | Freq: Once | INTRAVENOUS | Status: AC
  Administered 2024-01-21: 650 mg via INTRAVENOUS
  Filled 2024-01-21: qty 6.5

## 2024-01-21 MED ORDER — SODIUM CHLORIDE 0.9 % IV SOLN: INTRAVENOUS | Status: DC

## 2024-01-21 NOTE — Patient Instructions (Addendum)
 [facility] A DEPT OF [facility]  Discharge Instructions: Thank you for choosing [facility] to provide your oncology and hematology care.   If you have a lab appointment with the Cancer Center, please go directly to the Cancer Center and check in at the registration area.  Wear comfortable clothing and clothing appropriate for easy access to any Portacath or PICC line.   We strive to give you quality time with your provider. You may need to reschedule your appointment if you arrive late (15 or more minutes).  Arriving late affects you and other patients whose appointments are after yours.  Also, if you miss three or more appointments without notifying the office, you may be dismissed from the clinic at the provider's discretion.      For prescription refill requests, have your pharmacy contact our office and allow 72 hours for refills to be completed.    Today you received the following chemotherapy and/or immunotherapy agents ifosfamide , mesna , cisplatin      To help prevent nausea and vomiting after your treatment, we encourage you to take your nausea medication as directed.  BELOW ARE SYMPTOMS THAT SHOULD BE REPORTED IMMEDIATELY: *FEVER GREATER THAN 100.4 F (38 C) OR HIGHER *CHILLS OR SWEATING *NAUSEA AND VOMITING THAT IS NOT CONTROLLED WITH YOUR NAUSEA MEDICATION *UNUSUAL SHORTNESS OF BREATH *UNUSUAL BRUISING OR BLEEDING *URINARY PROBLEMS (pain or burning when urinating, or frequent urination) *BOWEL PROBLEMS (unusual diarrhea, constipation, pain near the anus) TENDERNESS IN MOUTH AND THROAT WITH OR WITHOUT PRESENCE OF ULCERS (sore throat, sores in mouth, or a toothache) UNUSUAL RASH, SWELLING OR PAIN  UNUSUAL VAGINAL DISCHARGE OR ITCHING   Items with * indicate a potential emergency and should be followed up as soon as possible or go to the Emergency Department if any problems should occur.  Please show the CHEMOTHERAPY ALERT CARD  or IMMUNOTHERAPY ALERT CARD at check-in to the Emergency Department and triage nurse. Should you have questions after your visit or need to cancel or reschedule your appointment, please contact [facility] - A DEPT OF [facility]  [phone] and follow the prompts.  Office hours are 8:00 a.m. to 4:30 p.m. Monday - Friday. Please note that voicemails left after 4:00 p.m. may not be returned until the following business day.  We are closed weekends and major holidays. You have access to a nurse at all times for urgent questions. Please call the main number to the clinic [phone] and follow the prompts.  For any non-urgent questions, you may also contact your provider using MyChart. We now offer e-Visits for anyone 51 and older to request care online for non-urgent symptoms. For details visit [email or web address].   Also download the MyChart app! Go to the app store, search MyChart, open the app, select [facility], and log in with your MyChart username and password.

## 2024-01-22 ENCOUNTER — Inpatient Hospital Stay

## 2024-01-22 VITALS — BP 163/89 | HR 67 | Temp 98.5°F | Resp 17

## 2024-01-22 DIAGNOSIS — C6291 Malignant neoplasm of right testis, unspecified whether descended or undescended: Secondary | ICD-10-CM

## 2024-01-22 DIAGNOSIS — Z5111 Encounter for antineoplastic chemotherapy: Secondary | ICD-10-CM | POA: Diagnosis not present

## 2024-01-22 MED ORDER — DEXAMETHASONE SODIUM PHOSPHATE 10 MG/ML IJ SOLN
10.0000 mg | Freq: Once | INTRAMUSCULAR | Status: AC
  Administered 2024-01-22: 10 mg via INTRAVENOUS
  Filled 2024-01-22: qty 1

## 2024-01-22 MED ORDER — MAGNESIUM SULFATE 2 GM/50ML IV SOLN
2.0000 g | Freq: Once | INTRAVENOUS | Status: AC
  Administered 2024-01-22: 2 g via INTRAVENOUS
  Filled 2024-01-22: qty 50

## 2024-01-22 MED ORDER — POTASSIUM CHLORIDE IN NACL 20-0.9 MEQ/L-% IV SOLN
Freq: Once | INTRAVENOUS | Status: AC
  Filled 2024-01-22: qty 1000

## 2024-01-22 MED ORDER — SODIUM CHLORIDE 0.9 % IV SOLN
25.0000 mg/m2 | Freq: Once | INTRAVENOUS | Status: AC
  Administered 2024-01-22: 50 mg via INTRAVENOUS
  Filled 2024-01-22: qty 50

## 2024-01-22 MED ORDER — SODIUM CHLORIDE 0.9 % IV SOLN
300.0000 mg/m2 | Freq: Once | INTRAVENOUS | Status: AC
  Administered 2024-01-22: 650 mg via INTRAVENOUS
  Filled 2024-01-22: qty 6.5

## 2024-01-22 MED ORDER — SODIUM CHLORIDE 0.9 % IV SOLN: INTRAVENOUS | Status: DC

## 2024-01-22 MED ORDER — SODIUM CHLORIDE 0.9 % IV SOLN
150.0000 mg | Freq: Once | INTRAVENOUS | Status: AC
  Administered 2024-01-22: 150 mg via INTRAVENOUS
  Filled 2024-01-22: qty 150

## 2024-01-22 MED ORDER — SODIUM CHLORIDE 0.9 % IV SOLN
Freq: Once | INTRAVENOUS | Status: AC
  Filled 2024-01-22: qty 60

## 2024-01-22 MED ORDER — PALONOSETRON HCL INJECTION 0.25 MG/5ML
0.2500 mg | Freq: Once | INTRAVENOUS | Status: AC
  Administered 2024-01-22: 0.25 mg via INTRAVENOUS
  Filled 2024-01-22: qty 5

## 2024-01-22 NOTE — Progress Notes (Signed)
 Per Dr. Maria Shiner ok to run post hydration fluids with Cisplatin  infusion.

## 2024-01-22 NOTE — Patient Instructions (Signed)
 [facility] A [facility] OF [facility]  Discharge Instructions: Thank you for choosing [facility] to provide your oncology and hematology care.   If you have a lab appointment with the Cancer Center, please go directly to the Cancer Center and check in at the registration area.  Wear comfortable clothing and clothing appropriate for easy access to any Portacath or PICC line.   We strive to give you quality time with your provider. You may need to reschedule your appointment if you arrive late (15 or more minutes).  Arriving late affects you and other patients whose appointments are after yours.  Also, if you miss three or more appointments without notifying the office, you may be dismissed from the clinic at the provider's discretion.      For prescription refill requests, have your pharmacy contact our office and allow 72 hours for refills to be completed.    Today you received the following chemotherapy and/or immunotherapy agents ifosfamide , mesna , cisplatin      To help prevent nausea and vomiting after your treatment, we encourage you to take your nausea medication as directed.  BELOW ARE SYMPTOMS THAT SHOULD BE REPORTED IMMEDIATELY: *FEVER GREATER THAN 100.4 F (38 C) OR HIGHER *CHILLS OR SWEATING *NAUSEA AND VOMITING THAT IS NOT CONTROLLED WITH YOUR NAUSEA MEDICATION *UNUSUAL SHORTNESS OF BREATH *UNUSUAL BRUISING OR BLEEDING *URINARY PROBLEMS (pain or burning when urinating, or frequent urination) *BOWEL PROBLEMS (unusual diarrhea, constipation, pain near the anus) TENDERNESS IN MOUTH AND THROAT WITH OR WITHOUT PRESENCE OF ULCERS (sore throat, sores in mouth, or a toothache) UNUSUAL RASH, SWELLING OR PAIN  UNUSUAL VAGINAL DISCHARGE OR ITCHING   Items with * indicate a potential emergency and should be followed up as soon as possible or go to the Emergency Department if any problems should occur.  Please show the CHEMOTHERAPY ALERT CARD  or IMMUNOTHERAPY ALERT CARD at check-in to the Emergency Department and triage nurse. Should you have questions after your visit or need to cancel or reschedule your appointment, please contact [facility] - A DEPT OF [facility]  [phone] and follow the prompts.  Office hours are 8:00 a.m. to 4:30 p.m. Monday - Friday. Please note that voicemails left after 4:00 p.m. may not be returned until the following business day.  We are closed weekends and major holidays. You have access to a nurse at all times for urgent questions. Please call the main number to the clinic [phone] and follow the prompts.  For any non-urgent questions, you may also contact your provider using MyChart. We now offer e-Visits for anyone 51 and older to request care online for non-urgent symptoms. For details visit [email or web address].   Also download the MyChart app! Go to the app store, search MyChart, open the app, select [facility], and log in with your MyChart username and password.

## 2024-01-25 ENCOUNTER — Inpatient Hospital Stay

## 2024-01-25 ENCOUNTER — Other Ambulatory Visit: Payer: Self-pay | Admitting: Medical Oncology

## 2024-01-25 DIAGNOSIS — C6291 Malignant neoplasm of right testis, unspecified whether descended or undescended: Secondary | ICD-10-CM

## 2024-01-25 DIAGNOSIS — Z5111 Encounter for antineoplastic chemotherapy: Secondary | ICD-10-CM | POA: Diagnosis not present

## 2024-01-25 LAB — CMP (CANCER CENTER ONLY)
ALT: 59 U/L — ABNORMAL HIGH (ref 0–44)
AST: 26 U/L (ref 15–41)
Albumin: 4.3 g/dL (ref 3.5–5.0)
Alkaline Phosphatase: 98 U/L (ref 38–126)
Anion gap: 11 (ref 5–15)
BUN: 24 mg/dL — ABNORMAL HIGH (ref 6–20)
CO2: 24 mmol/L (ref 22–32)
Calcium: 9.2 mg/dL (ref 8.9–10.3)
Chloride: 95 mmol/L — ABNORMAL LOW (ref 98–111)
Creatinine: 0.99 mg/dL (ref 0.61–1.24)
GFR, Estimated: >60 mL/min (ref >60)
Glucose, Bld: 102 mg/dL — ABNORMAL HIGH (ref 70–99)
Potassium: 4.3 mmol/L (ref 3.5–5.1)
Sodium: 130 mmol/L — ABNORMAL LOW (ref 135–145)
Total Bilirubin: 1 mg/dL (ref 0.0–1.2)
Total Protein: 7.4 g/dL (ref 6.5–8.1)

## 2024-01-25 LAB — CBC WITH DIFFERENTIAL (CANCER CENTER ONLY)
Abs Immature Granulocytes: 0.2 K/uL — ABNORMAL HIGH (ref 0.00–0.07)
Band Neutrophils: 5 %
Basophils Absolute: 0 K/uL (ref 0.0–0.1)
Basophils Relative: 0 %
Eosinophils Absolute: 0 K/uL (ref 0.0–0.5)
Eosinophils Relative: 0 %
HCT: 34.8 % — ABNORMAL LOW (ref 39.0–52.0)
Hemoglobin: 11.8 g/dL — ABNORMAL LOW (ref 13.0–17.0)
Lymphocytes Relative: 13 %
Lymphs Abs: 0.8 K/uL (ref 0.7–4.0)
MCH: 30.1 pg (ref 26.0–34.0)
MCHC: 33.9 g/dL (ref 30.0–36.0)
MCV: 88.8 fL (ref 80.0–100.0)
Metamyelocytes Relative: 1 %
Monocytes Absolute: 0.1 K/uL (ref 0.1–1.0)
Monocytes Relative: 1 %
Myelocytes: 3 %
Neutro Abs: 4.9 K/uL (ref 1.7–7.7)
Neutrophils Relative %: 77 %
Platelet Count: 183 K/uL (ref 150–400)
RBC: 3.92 MIL/uL — ABNORMAL LOW (ref 4.22–5.81)
RDW: 15.1 % (ref 11.5–15.5)
Smear Review: NORMAL
WBC Count: 6 K/uL (ref 4.0–10.5)
nRBC: 0 % (ref 0.0–0.2)

## 2024-01-25 MED ORDER — PEGFILGRASTIM-CBQV 6 MG/0.6ML ~~LOC~~ SOSY
6.0000 mg | PREFILLED_SYRINGE | Freq: Once | SUBCUTANEOUS | Status: AC
  Administered 2024-01-25 (×2): 6 mg via SUBCUTANEOUS
  Filled 2024-01-25: qty 0.6

## 2024-01-25 MED ORDER — DOXYCYCLINE HYCLATE 100 MG PO TABS: 100.0000 mg | ORAL_TABLET | Freq: Two times a day (BID) | ORAL | 0 refills | Status: AC

## 2024-01-25 NOTE — Progress Notes (Signed)
 Pt entered in clinic for injection. Pt has c/o  pain and swelling on Left upper leg and thigh Pt  stated he had given himself an inject the patient is red warm to tough. [facility] assessing patient for possible infection of wound.

## 2024-01-25 NOTE — Patient Instructions (Addendum)
 Implanted [facility] Guide An implanted port is a device that is placed under the skin. It is usually placed in the chest. The device may vary based on the need. Implanted ports can be used to give IV medicine, to take blood, or to give fluids. You may have an implanted port if: You need IV medicine that would be irritating to the small veins in your hands or arms. You need IV medicines, such as chemotherapy, for a long period of time. You need IV nutrition for a long period of time. You may have fewer limitations when using a port than you would if you used other types of long-term IVs. You will also likely be able to return to normal activities after your incision heals. An implanted port has two main parts: Reservoir. The reservoir is the part where a needle is inserted to give medicines or draw blood. The reservoir is round. After the port is placed, it appears as a small, raised area under your skin. Catheter. The catheter is a small, thin tube that connects the reservoir to a vein. Medicine that is inserted into the reservoir goes into the catheter and then into the vein. How is my port accessed? To access your port: A numbing cream may be placed on the skin over the port site. Your health care provider will put on a mask and sterile gloves. The skin over your port will be cleaned carefully with a germ-killing soap and allowed to dry. Your health care provider will gently pinch the port and insert a needle into it. Your health care provider will check for a blood return to make sure the port is in the vein and is still working (patent). If your port needs to remain accessed to get medicine continuously (constant infusion), your health care provider will place a clear bandage (dressing) over the needle site. The dressing and needle will need to be changed every week, or as told by your health care provider. What is flushing? Flushing helps keep the port working. Follow instructions from your  health care provider about how and when to flush the port. Ports are usually flushed with saline solution or a medicine called heparin. The need for flushing will depend on how the port is used: If the port is only used from time to time to give medicines or draw blood, the port may need to be flushed: Before and after medicines have been given. Before and after blood has been drawn. As part of routine maintenance. Flushing may be recommended every 4-6 weeks. If a constant infusion is running, the port may not need to be flushed. Throw away any syringes in a disposal container that is meant for sharp items (sharps container). You can buy a sharps container from a pharmacy, or you can make one by using an empty hard plastic bottle with a cover. How long will my port stay implanted? The port can stay in for as long as your health care provider thinks it is needed. When it is time for the port to come out, a surgery will be done to remove it. The surgery will be similar to the procedure that was done to put the port in. Follow these instructions at home: Caring for your port and port site Flush your port as told by your health care provider. If you need an infusion over several days, follow instructions from your health care provider about how to take care of your port site. Make sure you: Change your  dressing as told by your health care provider. Wash your hands with soap and water for at least 20 seconds before and after you change your dressing. If soap and water are not available, use alcohol-based hand sanitizer. Place any used dressings or infusion bags into a plastic bag. Throw that bag in the trash. Keep the dressing that covers the needle clean and dry. Do not get it wet. Do not use scissors or sharp objects near the infusion tubing. Keep any external tubes clamped, unless they are being used. Check your port site every day for signs of infection. Check for: Redness, swelling, or  pain. Fluid or blood. Warmth. Pus or a bad smell. Protect the skin around the port site. Avoid wearing bra straps that rub or irritate the site. Protect the skin around your port from seat belts. Place a soft pad over your chest if needed. Bathe or shower as told by your health care provider. The site may get wet as long as you are not actively receiving an infusion. General instructions  Return to your normal activities as told by your health care provider. Ask your health care provider what activities are safe for you. Carry a medical alert card or wear a medical alert bracelet at all times. This will let health care providers know that you have an implanted port in case of an emergency. Where to find more information American Cancer Society: www.cancer.org American Society of Clinical Oncology: www.cancer.net Contact a health care provider if: You have a fever or chills. You have redness, swelling, or pain at the port site. You have fluid or blood coming from your port site. Your incision feels warm to the touch. You have pus or a bad smell coming from the port site. Summary Implanted ports are usually placed in the chest for long-term IV access. Follow instructions from your health care provider about flushing the port and changing bandages (dressings). Take care of the area around your port by avoiding clothing that puts pressure on the area, and by watching for signs of infection. Protect the skin around your port from seat belts. Place a soft pad over your chest if needed. Contact a health care provider if you have a fever or you have redness, swelling, pain, fluid, or a bad smell at the port site. This information is not intended to replace advice given to you by your health care provider. Make sure you discuss any questions you have with your health care provider. Document Revised: 12/04/2020 Document Reviewed: 12/04/2020 Elsevier Patient Education  2024 Elsevier  Inc.  Pegfilgrastim Injection What is this medication? PEGFILGRASTIM (PEG fil gra stim) lowers the risk of infection in people who are receiving chemotherapy. It works by Systems analyst make more white blood cells, which protects your body from infection. It may also be used to help people who have been exposed to high doses of radiation. This medicine may be used for other purposes; ask your health care provider or pharmacist if you have questions. COMMON BRAND NAME(S): Cherly Hensen, Neulasta, Nyvepria, Stimufend, UDENYCA, UDENYCA ONBODY, Ziextenzo What should I tell my care team before I take this medication? They need to know if you have any of these conditions: Kidney disease Latex allergy Ongoing radiation therapy Sickle cell disease Skin reactions to acrylic adhesives (On-Body Injector only) An unusual or allergic reaction to pegfilgrastim, filgrastim, other medications, foods, dyes, or preservatives Pregnant or trying to get pregnant Breast-feeding How should I use this medication? This medication is for injection  under the skin. If you get this medication at home, you will be taught how to prepare and give the pre-filled syringe or how to use the On-body Injector. Refer to the patient Instructions for Use for detailed instructions. Use exactly as directed. Tell your care team immediately if you suspect that the On-body Injector may not have performed as intended or if you suspect the use of the On-body Injector resulted in a missed or partial dose. It is important that you put your used needles and syringes in a special sharps container. Do not put them in a trash can. If you do not have a sharps container, call your pharmacist or care team to get one. Talk to your care team about the use of this medication in children. While this medication may be prescribed for selected conditions, precautions do apply. Overdosage: If you think you have taken too much of this medicine contact  a poison control center or emergency room at once. NOTE: This medicine is only for you. Do not share this medicine with others. What if I miss a dose? It is important not to miss your dose. Call your care team if you miss your dose. If you miss a dose due to an On-body Injector failure or leakage, a new dose should be administered as soon as possible using a single prefilled syringe for manual use. What may interact with this medication? Interactions have not been studied. This list may not describe all possible interactions. Give your health care provider a list of all the medicines, herbs, non-prescription drugs, or dietary supplements you use. Also tell them if you smoke, drink alcohol, or use illegal drugs. Some items may interact with your medicine. What should I watch for while using this medication? Your condition will be monitored carefully while you are receiving this medication. You may need blood work done while you are taking this medication. Talk to your care team about your risk of cancer. You may be more at risk for certain types of cancer if you take this medication. If you are going to need a MRI, CT scan, or other procedure, tell your care team that you are using this medication (On-Body Injector only). What side effects may I notice from receiving this medication? Side effects that you should report to your care team as soon as possible: Allergic reactions--skin rash, itching, hives, swelling of the face, lips, tongue, or throat Capillary leak syndrome--stomach or muscle pain, unusual weakness or fatigue, feeling faint or lightheaded, decrease in the amount of urine, swelling of the ankles, hands, or feet, trouble breathing High white blood cell level--fever, fatigue, trouble breathing, night sweats, change in vision, weight loss Inflammation of the aorta--fever, fatigue, back, chest, or stomach pain, severe headache Kidney injury (glomerulonephritis)--decrease in the amount of  urine, red or dark brown urine, foamy or bubbly urine, swelling of the ankles, hands, or feet Shortness of breath or trouble breathing Spleen injury--pain in upper left stomach or shoulder Unusual bruising or bleeding Side effects that usually do not require medical attention (report to your care team if they continue or are bothersome): Bone pain Pain in the hands or feet This list may not describe all possible side effects. Call your doctor for medical advice about side effects. You may report side effects to FDA at 1-800-FDA-1088. Where should I keep my medication? Keep out of the reach of children. If you are using this medication at home, you will be instructed on how to store it. Throw away  any unused medication after the expiration date on the label. NOTE: This sheet is a summary. It may not cover all possible information. If you have questions about this medicine, talk to your doctor, pharmacist, or health care provider.  2024 Elsevier/Gold Standard (2021-05-03 00:00:00)

## 2024-01-27 ENCOUNTER — Inpatient Hospital Stay

## 2024-01-27 DIAGNOSIS — Z5111 Encounter for antineoplastic chemotherapy: Secondary | ICD-10-CM | POA: Diagnosis not present

## 2024-01-27 DIAGNOSIS — C6291 Malignant neoplasm of right testis, unspecified whether descended or undescended: Secondary | ICD-10-CM

## 2024-01-27 LAB — CBC WITH DIFFERENTIAL (CANCER CENTER ONLY)
Basophils Absolute: 0 K/uL (ref 0.0–0.1)
Basophils Relative: 2 %
Eosinophils Absolute: 0 K/uL (ref 0.0–0.5)
Eosinophils Relative: 3 %
HCT: 30.8 % — ABNORMAL LOW (ref 39.0–52.0)
Hemoglobin: 10.5 g/dL — ABNORMAL LOW (ref 13.0–17.0)
Lymphocytes Relative: 45 %
Lymphs Abs: 0.5 K/uL — ABNORMAL LOW (ref 0.7–4.0)
MCH: 29.5 pg (ref 26.0–34.0)
MCHC: 34.1 g/dL (ref 30.0–36.0)
MCV: 86.5 fL (ref 80.0–100.0)
Monocytes Absolute: 0.5 K/uL (ref 0.1–1.0)
Monocytes Relative: 38 %
Neutro Abs: 0.1 K/uL — CL (ref 1.7–7.7)
Neutrophils Relative %: 12 %
Platelet Count: 144 K/uL — ABNORMAL LOW (ref 150–400)
RBC: 3.56 MIL/uL — ABNORMAL LOW (ref 4.22–5.81)
RDW: 14.4 % (ref 11.5–15.5)
Smear Review: NORMAL
WBC Count: 1.2 K/uL — ABNORMAL LOW (ref 4.0–10.5)
nRBC: 0 % (ref 0.0–0.2)

## 2024-01-27 LAB — CMP (CANCER CENTER ONLY)
ALT: 39 U/L (ref 0–44)
AST: 16 U/L (ref 15–41)
Albumin: 4.4 g/dL (ref 3.5–5.0)
Alkaline Phosphatase: 83 U/L (ref 38–126)
Anion gap: 12 (ref 5–15)
BUN: 20 mg/dL (ref 6–20)
CO2: 23 mmol/L (ref 22–32)
Calcium: 9.1 mg/dL (ref 8.9–10.3)
Chloride: 99 mmol/L (ref 98–111)
Creatinine: 1.12 mg/dL (ref 0.61–1.24)
GFR, Estimated: >60 mL/min (ref >60)
Glucose, Bld: 97 mg/dL (ref 70–99)
Potassium: 4.6 mmol/L (ref 3.5–5.1)
Sodium: 134 mmol/L — ABNORMAL LOW (ref 135–145)
Total Bilirubin: 0.7 mg/dL (ref 0.0–1.2)
Total Protein: 7.5 g/dL (ref 6.5–8.1)

## 2024-01-27 NOTE — Progress Notes (Signed)
 Left thigh area appears to be less swollen, pink, slight warm to touch.

## 2024-01-27 NOTE — Patient Instructions (Signed)
 Implanted [facility] Guide An implanted port is a device that is placed under the skin. It is usually placed in the chest. The device may vary based on the need. Implanted ports can be used to give IV medicine, to take blood, or to give fluids. You may have an implanted port if: You need IV medicine that would be irritating to the small veins in your hands or arms. You need IV medicines, such as chemotherapy, for a long period of time. You need IV nutrition for a long period of time. You may have fewer limitations when using a port than you would if you used other types of long-term IVs. You will also likely be able to return to normal activities after your incision heals. An implanted port has two main parts: Reservoir. The reservoir is the part where a needle is inserted to give medicines or draw blood. The reservoir is round. After the port is placed, it appears as a small, raised area under your skin. Catheter. The catheter is a small, thin tube that connects the reservoir to a vein. Medicine that is inserted into the reservoir goes into the catheter and then into the vein. How is my port accessed? To access your port: A numbing cream may be placed on the skin over the port site. Your health care provider will put on a mask and sterile gloves. The skin over your port will be cleaned carefully with a germ-killing soap and allowed to dry. Your health care provider will gently pinch the port and insert a needle into it. Your health care provider will check for a blood return to make sure the port is in the vein and is still working (patent). If your port needs to remain accessed to get medicine continuously (constant infusion), your health care provider will place a clear bandage (dressing) over the needle site. The dressing and needle will need to be changed every week, or as told by your health care provider. What is flushing? Flushing helps keep the port working. Follow instructions from your  health care provider about how and when to flush the port. Ports are usually flushed with saline solution or a medicine called heparin. The need for flushing will depend on how the port is used: If the port is only used from time to time to give medicines or draw blood, the port may need to be flushed: Before and after medicines have been given. Before and after blood has been drawn. As part of routine maintenance. Flushing may be recommended every 4-6 weeks. If a constant infusion is running, the port may not need to be flushed. Throw away any syringes in a disposal container that is meant for sharp items (sharps container). You can buy a sharps container from a pharmacy, or you can make one by using an empty hard plastic bottle with a cover. How long will my port stay implanted? The port can stay in for as long as your health care provider thinks it is needed. When it is time for the port to come out, a surgery will be done to remove it. The surgery will be similar to the procedure that was done to put the port in. Follow these instructions at home: Caring for your port and port site Flush your port as told by your health care provider. If you need an infusion over several days, follow instructions from your health care provider about how to take care of your port site. Make sure you: Change your  dressing as told by your health care provider. Wash your hands with soap and water for at least 20 seconds before and after you change your dressing. If soap and water are not available, use alcohol-based hand sanitizer. Place any used dressings or infusion bags into a plastic bag. Throw that bag in the trash. Keep the dressing that covers the needle clean and dry. Do not get it wet. Do not use scissors or sharp objects near the infusion tubing. Keep any external tubes clamped, unless they are being used. Check your port site every day for signs of infection. Check for: Redness, swelling, or  pain. Fluid or blood. Warmth. Pus or a bad smell. Protect the skin around the port site. Avoid wearing bra straps that rub or irritate the site. Protect the skin around your port from seat belts. Place a soft pad over your chest if needed. Bathe or shower as told by your health care provider. The site may get wet as long as you are not actively receiving an infusion. General instructions  Return to your normal activities as told by your health care provider. Ask your health care provider what activities are safe for you. Carry a medical alert card or wear a medical alert bracelet at all times. This will let health care providers know that you have an implanted port in case of an emergency. Where to find more information American Cancer Society: www.cancer.org American Society of Clinical Oncology: www.cancer.net Contact a health care provider if: You have a fever or chills. You have redness, swelling, or pain at the port site. You have fluid or blood coming from your port site. Your incision feels warm to the touch. You have pus or a bad smell coming from the port site. Summary Implanted ports are usually placed in the chest for long-term IV access. Follow instructions from your health care provider about flushing the port and changing bandages (dressings). Take care of the area around your port by avoiding clothing that puts pressure on the area, and by watching for signs of infection. Protect the skin around your port from seat belts. Place a soft pad over your chest if needed. Contact a health care provider if you have a fever or you have redness, swelling, pain, fluid, or a bad smell at the port site. This information is not intended to replace advice given to you by your health care provider. Make sure you discuss any questions you have with your health care provider. Document Revised: 12/04/2020 Document Reviewed: 12/04/2020 Elsevier Patient Education  2024 Elsevier  Inc.  Pegfilgrastim Injection What is this medication? PEGFILGRASTIM (PEG fil gra stim) lowers the risk of infection in people who are receiving chemotherapy. It works by Systems analyst make more white blood cells, which protects your body from infection. It may also be used to help people who have been exposed to high doses of radiation. This medicine may be used for other purposes; ask your health care provider or pharmacist if you have questions. COMMON BRAND NAME(S): Cherly Hensen, Neulasta, Nyvepria, Stimufend, UDENYCA, UDENYCA ONBODY, Ziextenzo What should I tell my care team before I take this medication? They need to know if you have any of these conditions: Kidney disease Latex allergy Ongoing radiation therapy Sickle cell disease Skin reactions to acrylic adhesives (On-Body Injector only) An unusual or allergic reaction to pegfilgrastim, filgrastim, other medications, foods, dyes, or preservatives Pregnant or trying to get pregnant Breast-feeding How should I use this medication? This medication is for injection  under the skin. If you get this medication at home, you will be taught how to prepare and give the pre-filled syringe or how to use the On-body Injector. Refer to the patient Instructions for Use for detailed instructions. Use exactly as directed. Tell your care team immediately if you suspect that the On-body Injector may not have performed as intended or if you suspect the use of the On-body Injector resulted in a missed or partial dose. It is important that you put your used needles and syringes in a special sharps container. Do not put them in a trash can. If you do not have a sharps container, call your pharmacist or care team to get one. Talk to your care team about the use of this medication in children. While this medication may be prescribed for selected conditions, precautions do apply. Overdosage: If you think you have taken too much of this medicine contact  a poison control center or emergency room at once. NOTE: This medicine is only for you. Do not share this medicine with others. What if I miss a dose? It is important not to miss your dose. Call your care team if you miss your dose. If you miss a dose due to an On-body Injector failure or leakage, a new dose should be administered as soon as possible using a single prefilled syringe for manual use. What may interact with this medication? Interactions have not been studied. This list may not describe all possible interactions. Give your health care provider a list of all the medicines, herbs, non-prescription drugs, or dietary supplements you use. Also tell them if you smoke, drink alcohol, or use illegal drugs. Some items may interact with your medicine. What should I watch for while using this medication? Your condition will be monitored carefully while you are receiving this medication. You may need blood work done while you are taking this medication. Talk to your care team about your risk of cancer. You may be more at risk for certain types of cancer if you take this medication. If you are going to need a MRI, CT scan, or other procedure, tell your care team that you are using this medication (On-Body Injector only). What side effects may I notice from receiving this medication? Side effects that you should report to your care team as soon as possible: Allergic reactions--skin rash, itching, hives, swelling of the face, lips, tongue, or throat Capillary leak syndrome--stomach or muscle pain, unusual weakness or fatigue, feeling faint or lightheaded, decrease in the amount of urine, swelling of the ankles, hands, or feet, trouble breathing High white blood cell level--fever, fatigue, trouble breathing, night sweats, change in vision, weight loss Inflammation of the aorta--fever, fatigue, back, chest, or stomach pain, severe headache Kidney injury (glomerulonephritis)--decrease in the amount of  urine, red or dark brown urine, foamy or bubbly urine, swelling of the ankles, hands, or feet Shortness of breath or trouble breathing Spleen injury--pain in upper left stomach or shoulder Unusual bruising or bleeding Side effects that usually do not require medical attention (report to your care team if they continue or are bothersome): Bone pain Pain in the hands or feet This list may not describe all possible side effects. Call your doctor for medical advice about side effects. You may report side effects to FDA at 1-800-FDA-1088. Where should I keep my medication? Keep out of the reach of children. If you are using this medication at home, you will be instructed on how to store it. Throw away  any unused medication after the expiration date on the label. NOTE: This sheet is a summary. It may not cover all possible information. If you have questions about this medicine, talk to your doctor, pharmacist, or health care provider.  2024 Elsevier/Gold Standard (2021-05-03 00:00:00)

## 2024-01-29 ENCOUNTER — Inpatient Hospital Stay

## 2024-01-29 DIAGNOSIS — C6291 Malignant neoplasm of right testis, unspecified whether descended or undescended: Secondary | ICD-10-CM

## 2024-01-29 DIAGNOSIS — Z5111 Encounter for antineoplastic chemotherapy: Secondary | ICD-10-CM | POA: Diagnosis not present

## 2024-01-29 LAB — CMP (CANCER CENTER ONLY)
ALT: 24 U/L (ref 0–44)
AST: 17 U/L (ref 15–41)
Albumin: 3.9 g/dL (ref 3.5–5.0)
Alkaline Phosphatase: 109 U/L (ref 38–126)
Anion gap: 11 (ref 5–15)
BUN: 14 mg/dL (ref 6–20)
CO2: 23 mmol/L (ref 22–32)
Calcium: 8.6 mg/dL — ABNORMAL LOW (ref 8.9–10.3)
Chloride: 103 mmol/L (ref 98–111)
Creatinine: 1.06 mg/dL (ref 0.61–1.24)
GFR, Estimated: >60 mL/min (ref >60)
Glucose, Bld: 123 mg/dL — ABNORMAL HIGH (ref 70–99)
Potassium: 3.7 mmol/L (ref 3.5–5.1)
Sodium: 137 mmol/L (ref 135–145)
Total Bilirubin: 0.3 mg/dL (ref 0.0–1.2)
Total Protein: 6.4 g/dL — ABNORMAL LOW (ref 6.5–8.1)

## 2024-01-29 LAB — CBC WITH DIFFERENTIAL (CANCER CENTER ONLY)
Abs Immature Granulocytes: 1.09 K/uL — ABNORMAL HIGH (ref 0.00–0.07)
Basophils Absolute: 0.1 K/uL (ref 0.0–0.1)
Basophils Relative: 0 %
Eosinophils Absolute: 0 K/uL (ref 0.0–0.5)
Eosinophils Relative: 0 %
HCT: 26.8 % — ABNORMAL LOW (ref 39.0–52.0)
Hemoglobin: 9.1 g/dL — ABNORMAL LOW (ref 13.0–17.0)
Immature Granulocytes: 9 %
Lymphocytes Relative: 9 %
Lymphs Abs: 1.1 K/uL (ref 0.7–4.0)
MCH: 30.2 pg (ref 26.0–34.0)
MCHC: 34 g/dL (ref 30.0–36.0)
MCV: 89 fL (ref 80.0–100.0)
Monocytes Absolute: 2.6 K/uL — ABNORMAL HIGH (ref 0.1–1.0)
Monocytes Relative: 21 %
Neutro Abs: 7.5 K/uL (ref 1.7–7.7)
Neutrophils Relative %: 61 %
Platelet Count: 111 K/uL — ABNORMAL LOW (ref 150–400)
RBC: 3.01 MIL/uL — ABNORMAL LOW (ref 4.22–5.81)
RDW: 15.4 % (ref 11.5–15.5)
WBC Count: 12.4 K/uL — ABNORMAL HIGH (ref 4.0–10.5)
nRBC: 0.2 % (ref 0.0–0.2)

## 2024-01-30 ENCOUNTER — Other Ambulatory Visit: Payer: Self-pay | Admitting: Hematology and Oncology

## 2024-01-30 DIAGNOSIS — R319 Hematuria, unspecified: Secondary | ICD-10-CM

## 2024-01-30 DIAGNOSIS — C6292 Malignant neoplasm of left testis, unspecified whether descended or undescended: Secondary | ICD-10-CM

## 2024-01-30 MED ORDER — CIPROFLOXACIN HCL 500 MG PO TABS: 500.0000 mg | ORAL_TABLET | Freq: Two times a day (BID) | ORAL | 0 refills | Status: AC

## 2024-01-30 NOTE — Progress Notes (Signed)
 Pt called on call service with burning urination and brownish urine. He didn't report fevers or chills. He however had recent instrumentation and also had chemotherapy last week. Out of abundance of caution since he is immunocompromised, we discussed ciprofloxacin  Bid for 7 days He was instructed to go to the hospital with worsening symptoms,fevers and chills. He was also instructed to call Dr Jessy office on Monday morning to give urine sample for UA  Paislie Tessler

## 2024-02-01 ENCOUNTER — Other Ambulatory Visit

## 2024-02-01 ENCOUNTER — Ambulatory Visit

## 2024-02-01 ENCOUNTER — Ambulatory Visit: Admitting: Hematology & Oncology

## 2024-02-01 ENCOUNTER — Inpatient Hospital Stay

## 2024-02-01 VITALS — BP 129/83 | HR 85 | Temp 98.3°F | Resp 20

## 2024-02-01 DIAGNOSIS — Z5111 Encounter for antineoplastic chemotherapy: Secondary | ICD-10-CM | POA: Diagnosis not present

## 2024-02-01 DIAGNOSIS — C7802 Secondary malignant neoplasm of left lung: Secondary | ICD-10-CM

## 2024-02-01 DIAGNOSIS — C6291 Malignant neoplasm of right testis, unspecified whether descended or undescended: Secondary | ICD-10-CM

## 2024-02-01 LAB — CMP (CANCER CENTER ONLY)
ALT: 21 U/L (ref 0–44)
AST: 29 U/L (ref 15–41)
Albumin: 4.3 g/dL (ref 3.5–5.0)
Alkaline Phosphatase: 185 U/L — ABNORMAL HIGH (ref 38–126)
Anion gap: 12 (ref 5–15)
BUN: 12 mg/dL (ref 6–20)
CO2: 25 mmol/L (ref 22–32)
Calcium: 9.3 mg/dL (ref 8.9–10.3)
Chloride: 102 mmol/L (ref 98–111)
Creatinine: 1.1 mg/dL (ref 0.61–1.24)
GFR, Estimated: >60 mL/min (ref >60)
Glucose, Bld: 86 mg/dL (ref 70–99)
Potassium: 4.2 mmol/L (ref 3.5–5.1)
Sodium: 138 mmol/L (ref 135–145)
Total Bilirubin: 0.3 mg/dL (ref 0.0–1.2)
Total Protein: 7.1 g/dL (ref 6.5–8.1)

## 2024-02-01 LAB — CBC WITH DIFFERENTIAL (CANCER CENTER ONLY)
Abs Immature Granulocytes: 1.5 K/uL — ABNORMAL HIGH (ref 0.00–0.07)
Band Neutrophils: 2 %
Basophils Absolute: 0 K/uL (ref 0.0–0.1)
Basophils Relative: 0 %
Eosinophils Absolute: 0 K/uL (ref 0.0–0.5)
Eosinophils Relative: 0 %
HCT: 30.7 % — ABNORMAL LOW (ref 39.0–52.0)
Hemoglobin: 10.2 g/dL — ABNORMAL LOW (ref 13.0–17.0)
Lymphocytes Relative: 8 %
Lymphs Abs: 2.4 K/uL (ref 0.7–4.0)
MCH: 30.4 pg (ref 26.0–34.0)
MCHC: 33.2 g/dL (ref 30.0–36.0)
MCV: 91.6 fL (ref 80.0–100.0)
Metamyelocytes Relative: 2 %
Monocytes Absolute: 0.9 K/uL (ref 0.1–1.0)
Monocytes Relative: 3 %
Myelocytes: 3 %
Neutro Abs: 25.5 K/uL — ABNORMAL HIGH (ref 1.7–7.7)
Neutrophils Relative %: 82 %
Platelet Count: 129 K/uL — ABNORMAL LOW (ref 150–400)
RBC: 3.35 MIL/uL — ABNORMAL LOW (ref 4.22–5.81)
RDW: 18.7 % — ABNORMAL HIGH (ref 11.5–15.5)
Smear Review: NORMAL
WBC Count: 30.3 K/uL — ABNORMAL HIGH (ref 4.0–10.5)
nRBC: 0.2 % (ref 0.0–0.2)

## 2024-02-01 LAB — URINALYSIS, COMPLETE (UACMP) WITH MICROSCOPIC
Bilirubin Urine: NEGATIVE
Glucose, UA: NEGATIVE mg/dL
Ketones, ur: NEGATIVE mg/dL
Leukocytes,Ua: NEGATIVE
Nitrite: NEGATIVE
Protein, ur: NEGATIVE mg/dL
Specific Gravity, Urine: 1.015 (ref 1.005–1.030)
pH: 7 (ref 5.0–8.0)

## 2024-02-01 NOTE — Patient Instructions (Signed)
 Implanted [facility] Guide An implanted port is a device that is placed under the skin. It is usually placed in the chest. The device may vary based on the need. Implanted ports can be used to give IV medicine, to take blood, or to give fluids. You may have an implanted port if: You need IV medicine that would be irritating to the small veins in your hands or arms. You need IV medicines, such as chemotherapy, for a long period of time. You need IV nutrition for a long period of time. You may have fewer limitations when using a port than you would if you used other types of long-term IVs. You will also likely be able to return to normal activities after your incision heals. An implanted port has two main parts: Reservoir. The reservoir is the part where a needle is inserted to give medicines or draw blood. The reservoir is round. After the port is placed, it appears as a small, raised area under your skin. Catheter. The catheter is a small, thin tube that connects the reservoir to a vein. Medicine that is inserted into the reservoir goes into the catheter and then into the vein. How is my port accessed? To access your port: A numbing cream may be placed on the skin over the port site. Your health care provider will put on a mask and sterile gloves. The skin over your port will be cleaned carefully with a germ-killing soap and allowed to dry. Your health care provider will gently pinch the port and insert a needle into it. Your health care provider will check for a blood return to make sure the port is in the vein and is still working (patent). If your port needs to remain accessed to get medicine continuously (constant infusion), your health care provider will place a clear bandage (dressing) over the needle site. The dressing and needle will need to be changed every week, or as told by your health care provider. What is flushing? Flushing helps keep the port working. Follow instructions from your  health care provider about how and when to flush the port. Ports are usually flushed with saline solution or a medicine called heparin. The need for flushing will depend on how the port is used: If the port is only used from time to time to give medicines or draw blood, the port may need to be flushed: Before and after medicines have been given. Before and after blood has been drawn. As part of routine maintenance. Flushing may be recommended every 4-6 weeks. If a constant infusion is running, the port may not need to be flushed. Throw away any syringes in a disposal container that is meant for sharp items (sharps container). You can buy a sharps container from a pharmacy, or you can make one by using an empty hard plastic bottle with a cover. How long will my port stay implanted? The port can stay in for as long as your health care provider thinks it is needed. When it is time for the port to come out, a surgery will be done to remove it. The surgery will be similar to the procedure that was done to put the port in. Follow these instructions at home: Caring for your port and port site Flush your port as told by your health care provider. If you need an infusion over several days, follow instructions from your health care provider about how to take care of your port site. Make sure you: Change your  dressing as told by your health care provider. Wash your hands with soap and water for at least 20 seconds before and after you change your dressing. If soap and water are not available, use alcohol-based hand sanitizer. Place any used dressings or infusion bags into a plastic bag. Throw that bag in the trash. Keep the dressing that covers the needle clean and dry. Do not get it wet. Do not use scissors or sharp objects near the infusion tubing. Keep any external tubes clamped, unless they are being used. Check your port site every day for signs of infection. Check for: Redness, swelling, or  pain. Fluid or blood. Warmth. Pus or a bad smell. Protect the skin around the port site. Avoid wearing bra straps that rub or irritate the site. Protect the skin around your port from seat belts. Place a soft pad over your chest if needed. Bathe or shower as told by your health care provider. The site may get wet as long as you are not actively receiving an infusion. General instructions  Return to your normal activities as told by your health care provider. Ask your health care provider what activities are safe for you. Carry a medical alert card or wear a medical alert bracelet at all times. This will let health care providers know that you have an implanted port in case of an emergency. Where to find more information American Cancer Society: www.cancer.org American Society of Clinical Oncology: www.cancer.net Contact a health care provider if: You have a fever or chills. You have redness, swelling, or pain at the port site. You have fluid or blood coming from your port site. Your incision feels warm to the touch. You have pus or a bad smell coming from the port site. Summary Implanted ports are usually placed in the chest for long-term IV access. Follow instructions from your health care provider about flushing the port and changing bandages (dressings). Take care of the area around your port by avoiding clothing that puts pressure on the area, and by watching for signs of infection. Protect the skin around your port from seat belts. Place a soft pad over your chest if needed. Contact a health care provider if you have a fever or you have redness, swelling, pain, fluid, or a bad smell at the port site. This information is not intended to replace advice given to you by your health care provider. Make sure you discuss any questions you have with your health care provider. Document Revised: 12/04/2020 Document Reviewed: 12/04/2020 Elsevier Patient Education  2024 Elsevier  Inc.  Pegfilgrastim Injection What is this medication? PEGFILGRASTIM (PEG fil gra stim) lowers the risk of infection in people who are receiving chemotherapy. It works by Systems analyst make more white blood cells, which protects your body from infection. It may also be used to help people who have been exposed to high doses of radiation. This medicine may be used for other purposes; ask your health care provider or pharmacist if you have questions. COMMON BRAND NAME(S): Cherly Hensen, Neulasta, Nyvepria, Stimufend, UDENYCA, UDENYCA ONBODY, Ziextenzo What should I tell my care team before I take this medication? They need to know if you have any of these conditions: Kidney disease Latex allergy Ongoing radiation therapy Sickle cell disease Skin reactions to acrylic adhesives (On-Body Injector only) An unusual or allergic reaction to pegfilgrastim, filgrastim, other medications, foods, dyes, or preservatives Pregnant or trying to get pregnant Breast-feeding How should I use this medication? This medication is for injection  under the skin. If you get this medication at home, you will be taught how to prepare and give the pre-filled syringe or how to use the On-body Injector. Refer to the patient Instructions for Use for detailed instructions. Use exactly as directed. Tell your care team immediately if you suspect that the On-body Injector may not have performed as intended or if you suspect the use of the On-body Injector resulted in a missed or partial dose. It is important that you put your used needles and syringes in a special sharps container. Do not put them in a trash can. If you do not have a sharps container, call your pharmacist or care team to get one. Talk to your care team about the use of this medication in children. While this medication may be prescribed for selected conditions, precautions do apply. Overdosage: If you think you have taken too much of this medicine contact  a poison control center or emergency room at once. NOTE: This medicine is only for you. Do not share this medicine with others. What if I miss a dose? It is important not to miss your dose. Call your care team if you miss your dose. If you miss a dose due to an On-body Injector failure or leakage, a new dose should be administered as soon as possible using a single prefilled syringe for manual use. What may interact with this medication? Interactions have not been studied. This list may not describe all possible interactions. Give your health care provider a list of all the medicines, herbs, non-prescription drugs, or dietary supplements you use. Also tell them if you smoke, drink alcohol, or use illegal drugs. Some items may interact with your medicine. What should I watch for while using this medication? Your condition will be monitored carefully while you are receiving this medication. You may need blood work done while you are taking this medication. Talk to your care team about your risk of cancer. You may be more at risk for certain types of cancer if you take this medication. If you are going to need a MRI, CT scan, or other procedure, tell your care team that you are using this medication (On-Body Injector only). What side effects may I notice from receiving this medication? Side effects that you should report to your care team as soon as possible: Allergic reactions--skin rash, itching, hives, swelling of the face, lips, tongue, or throat Capillary leak syndrome--stomach or muscle pain, unusual weakness or fatigue, feeling faint or lightheaded, decrease in the amount of urine, swelling of the ankles, hands, or feet, trouble breathing High white blood cell level--fever, fatigue, trouble breathing, night sweats, change in vision, weight loss Inflammation of the aorta--fever, fatigue, back, chest, or stomach pain, severe headache Kidney injury (glomerulonephritis)--decrease in the amount of  urine, red or dark brown urine, foamy or bubbly urine, swelling of the ankles, hands, or feet Shortness of breath or trouble breathing Spleen injury--pain in upper left stomach or shoulder Unusual bruising or bleeding Side effects that usually do not require medical attention (report to your care team if they continue or are bothersome): Bone pain Pain in the hands or feet This list may not describe all possible side effects. Call your doctor for medical advice about side effects. You may report side effects to FDA at 1-800-FDA-1088. Where should I keep my medication? Keep out of the reach of children. If you are using this medication at home, you will be instructed on how to store it. Throw away  any unused medication after the expiration date on the label. NOTE: This sheet is a summary. It may not cover all possible information. If you have questions about this medicine, talk to your doctor, pharmacist, or health care provider.  2024 Elsevier/Gold Standard (2021-05-03 00:00:00)

## 2024-02-02 ENCOUNTER — Ambulatory Visit

## 2024-02-02 ENCOUNTER — Ambulatory Visit: Payer: Self-pay | Admitting: Hematology & Oncology

## 2024-02-02 LAB — URINE CULTURE: Culture: NO GROWTH

## 2024-02-03 ENCOUNTER — Ambulatory Visit

## 2024-02-03 ENCOUNTER — Inpatient Hospital Stay

## 2024-02-04 ENCOUNTER — Ambulatory Visit

## 2024-02-05 ENCOUNTER — Inpatient Hospital Stay

## 2024-02-05 ENCOUNTER — Ambulatory Visit

## 2024-02-05 ENCOUNTER — Inpatient Hospital Stay: Admitting: Hematology & Oncology

## 2024-02-08 ENCOUNTER — Ambulatory Visit

## 2024-02-08 ENCOUNTER — Ambulatory Visit: Admitting: Hematology & Oncology

## 2024-02-08 ENCOUNTER — Other Ambulatory Visit

## 2024-02-09 ENCOUNTER — Ambulatory Visit

## 2024-02-09 ENCOUNTER — Inpatient Hospital Stay

## 2024-02-09 NOTE — Patient Instructions (Signed)
 Implanted [facility] Guide An implanted port is a device that is placed under the skin. It is usually placed in the chest. The device may vary based on the need. Implanted ports can be used to give IV medicine, to take blood, or to give fluids. You may have an implanted port if: You need IV medicine that would be irritating to the small veins in your hands or arms. You need IV medicines, such as chemotherapy, for a long period of time. You need IV nutrition for a long period of time. You may have fewer limitations when using a port than you would if you used other types of long-term IVs. You will also likely be able to return to normal activities after your incision heals. An implanted port has two main parts: Reservoir. The reservoir is the part where a needle is inserted to give medicines or draw blood. The reservoir is round. After the port is placed, it appears as a small, raised area under your skin. Catheter. The catheter is a small, thin tube that connects the reservoir to a vein. Medicine that is inserted into the reservoir goes into the catheter and then into the vein. How is my port accessed? To access your port: A numbing cream may be placed on the skin over the port site. Your health care provider will put on a mask and sterile gloves. The skin over your port will be cleaned carefully with a germ-killing soap and allowed to dry. Your health care provider will gently pinch the port and insert a needle into it. Your health care provider will check for a blood return to make sure the port is in the vein and is still working (patent). If your port needs to remain accessed to get medicine continuously (constant infusion), your health care provider will place a clear bandage (dressing) over the needle site. The dressing and needle will need to be changed every week, or as told by your health care provider. What is flushing? Flushing helps keep the port working. Follow instructions from your  health care provider about how and when to flush the port. Ports are usually flushed with saline solution or a medicine called heparin. The need for flushing will depend on how the port is used: If the port is only used from time to time to give medicines or draw blood, the port may need to be flushed: Before and after medicines have been given. Before and after blood has been drawn. As part of routine maintenance. Flushing may be recommended every 4-6 weeks. If a constant infusion is running, the port may not need to be flushed. Throw away any syringes in a disposal container that is meant for sharp items (sharps container). You can buy a sharps container from a pharmacy, or you can make one by using an empty hard plastic bottle with a cover. How long will my port stay implanted? The port can stay in for as long as your health care provider thinks it is needed. When it is time for the port to come out, a surgery will be done to remove it. The surgery will be similar to the procedure that was done to put the port in. Follow these instructions at home: Caring for your port and port site Flush your port as told by your health care provider. If you need an infusion over several days, follow instructions from your health care provider about how to take care of your port site. Make sure you: Change your  dressing as told by your health care provider. Wash your hands with soap and water for at least 20 seconds before and after you change your dressing. If soap and water are not available, use alcohol-based hand sanitizer. Place any used dressings or infusion bags into a plastic bag. Throw that bag in the trash. Keep the dressing that covers the needle clean and dry. Do not get it wet. Do not use scissors or sharp objects near the infusion tubing. Keep any external tubes clamped, unless they are being used. Check your port site every day for signs of infection. Check for: Redness, swelling, or  pain. Fluid or blood. Warmth. Pus or a bad smell. Protect the skin around the port site. Avoid wearing bra straps that rub or irritate the site. Protect the skin around your port from seat belts. Place a soft pad over your chest if needed. Bathe or shower as told by your health care provider. The site may get wet as long as you are not actively receiving an infusion. General instructions  Return to your normal activities as told by your health care provider. Ask your health care provider what activities are safe for you. Carry a medical alert card or wear a medical alert bracelet at all times. This will let health care providers know that you have an implanted port in case of an emergency. Where to find more information American Cancer Society: www.cancer.org American Society of Clinical Oncology: www.cancer.net Contact a health care provider if: You have a fever or chills. You have redness, swelling, or pain at the port site. You have fluid or blood coming from your port site. Your incision feels warm to the touch. You have pus or a bad smell coming from the port site. Summary Implanted ports are usually placed in the chest for long-term IV access. Follow instructions from your health care provider about flushing the port and changing bandages (dressings). Take care of the area around your port by avoiding clothing that puts pressure on the area, and by watching for signs of infection. Protect the skin around your port from seat belts. Place a soft pad over your chest if needed. Contact a health care provider if you have a fever or you have redness, swelling, pain, fluid, or a bad smell at the port site. This information is not intended to replace advice given to you by your health care provider. Make sure you discuss any questions you have with your health care provider. Document Revised: 12/04/2020 Document Reviewed: 12/04/2020 Elsevier Patient Education  2024 Elsevier  Inc.  Pegfilgrastim Injection What is this medication? PEGFILGRASTIM (PEG fil gra stim) lowers the risk of infection in people who are receiving chemotherapy. It works by Systems analyst make more white blood cells, which protects your body from infection. It may also be used to help people who have been exposed to high doses of radiation. This medicine may be used for other purposes; ask your health care provider or pharmacist if you have questions. COMMON BRAND NAME(S): Cherly Hensen, Neulasta, Nyvepria, Stimufend, UDENYCA, UDENYCA ONBODY, Ziextenzo What should I tell my care team before I take this medication? They need to know if you have any of these conditions: Kidney disease Latex allergy Ongoing radiation therapy Sickle cell disease Skin reactions to acrylic adhesives (On-Body Injector only) An unusual or allergic reaction to pegfilgrastim, filgrastim, other medications, foods, dyes, or preservatives Pregnant or trying to get pregnant Breast-feeding How should I use this medication? This medication is for injection  under the skin. If you get this medication at home, you will be taught how to prepare and give the pre-filled syringe or how to use the On-body Injector. Refer to the patient Instructions for Use for detailed instructions. Use exactly as directed. Tell your care team immediately if you suspect that the On-body Injector may not have performed as intended or if you suspect the use of the On-body Injector resulted in a missed or partial dose. It is important that you put your used needles and syringes in a special sharps container. Do not put them in a trash can. If you do not have a sharps container, call your pharmacist or care team to get one. Talk to your care team about the use of this medication in children. While this medication may be prescribed for selected conditions, precautions do apply. Overdosage: If you think you have taken too much of this medicine contact  a poison control center or emergency room at once. NOTE: This medicine is only for you. Do not share this medicine with others. What if I miss a dose? It is important not to miss your dose. Call your care team if you miss your dose. If you miss a dose due to an On-body Injector failure or leakage, a new dose should be administered as soon as possible using a single prefilled syringe for manual use. What may interact with this medication? Interactions have not been studied. This list may not describe all possible interactions. Give your health care provider a list of all the medicines, herbs, non-prescription drugs, or dietary supplements you use. Also tell them if you smoke, drink alcohol, or use illegal drugs. Some items may interact with your medicine. What should I watch for while using this medication? Your condition will be monitored carefully while you are receiving this medication. You may need blood work done while you are taking this medication. Talk to your care team about your risk of cancer. You may be more at risk for certain types of cancer if you take this medication. If you are going to need a MRI, CT scan, or other procedure, tell your care team that you are using this medication (On-Body Injector only). What side effects may I notice from receiving this medication? Side effects that you should report to your care team as soon as possible: Allergic reactions--skin rash, itching, hives, swelling of the face, lips, tongue, or throat Capillary leak syndrome--stomach or muscle pain, unusual weakness or fatigue, feeling faint or lightheaded, decrease in the amount of urine, swelling of the ankles, hands, or feet, trouble breathing High white blood cell level--fever, fatigue, trouble breathing, night sweats, change in vision, weight loss Inflammation of the aorta--fever, fatigue, back, chest, or stomach pain, severe headache Kidney injury (glomerulonephritis)--decrease in the amount of  urine, red or dark brown urine, foamy or bubbly urine, swelling of the ankles, hands, or feet Shortness of breath or trouble breathing Spleen injury--pain in upper left stomach or shoulder Unusual bruising or bleeding Side effects that usually do not require medical attention (report to your care team if they continue or are bothersome): Bone pain Pain in the hands or feet This list may not describe all possible side effects. Call your doctor for medical advice about side effects. You may report side effects to FDA at 1-800-FDA-1088. Where should I keep my medication? Keep out of the reach of children. If you are using this medication at home, you will be instructed on how to store it. Throw away  any unused medication after the expiration date on the label. NOTE: This sheet is a summary. It may not cover all possible information. If you have questions about this medicine, talk to your doctor, pharmacist, or health care provider.  2024 Elsevier/Gold Standard (2021-05-03 00:00:00)

## 2024-02-10 ENCOUNTER — Ambulatory Visit

## 2024-02-11 ENCOUNTER — Ambulatory Visit

## 2024-02-12 ENCOUNTER — Encounter: Payer: Self-pay | Admitting: *Deleted

## 2024-02-12 ENCOUNTER — Ambulatory Visit

## 2024-02-12 NOTE — Progress Notes (Signed)
 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  Oncology Nurse Navigator Documentation     02/12/2024    9:45 AM  Oncology Nurse Navigator Flowsheets  Navigator Follow Up Date: 03/16/2024  Navigator Follow Up Reason: Appointment Review  Navigator Location [facility]  Navigator Encounter Type Appt/Treatment Plan Review  Patient Visit Type MedOnc  Treatment Phase Active Tx  Barriers/Navigation Needs Coordination of Care;Education  Interventions None Required  Acuity Level 1-No Barriers  Support Groups/Services Friends and Family  Time Spent with Patient 15

## 2024-02-14 LAB — COLOGUARD: COLOGUARD: NEGATIVE

## 2024-02-16 ENCOUNTER — Ambulatory Visit

## 2024-02-18 ENCOUNTER — Encounter: Payer: Self-pay | Admitting: *Deleted

## 2024-02-19 ENCOUNTER — Other Ambulatory Visit: Payer: Self-pay | Admitting: Hematology & Oncology

## 2024-02-19 ENCOUNTER — Other Ambulatory Visit: Payer: Self-pay

## 2024-02-19 DIAGNOSIS — G5793 Unspecified mononeuropathy of bilateral lower limbs: Secondary | ICD-10-CM

## 2024-02-19 DIAGNOSIS — C801 Malignant (primary) neoplasm, unspecified: Secondary | ICD-10-CM

## 2024-02-19 DIAGNOSIS — C6291 Malignant neoplasm of right testis, unspecified whether descended or undescended: Secondary | ICD-10-CM

## 2024-02-26 ENCOUNTER — Other Ambulatory Visit: Payer: Self-pay

## 2024-02-28 ENCOUNTER — Other Ambulatory Visit: Payer: Self-pay

## 2024-02-29 ENCOUNTER — Other Ambulatory Visit

## 2024-02-29 ENCOUNTER — Telehealth: Payer: Self-pay | Admitting: *Deleted

## 2024-02-29 DIAGNOSIS — R252 Cramp and spasm: Secondary | ICD-10-CM

## 2024-02-29 DIAGNOSIS — G5793 Unspecified mononeuropathy of bilateral lower limbs: Secondary | ICD-10-CM

## 2024-02-29 DIAGNOSIS — R1909 Other intra-abdominal and pelvic swelling, mass and lump: Secondary | ICD-10-CM

## 2024-02-29 DIAGNOSIS — R319 Hematuria, unspecified: Secondary | ICD-10-CM

## 2024-02-29 DIAGNOSIS — I440 Atrioventricular block, first degree: Secondary | ICD-10-CM

## 2024-02-29 DIAGNOSIS — C6292 Malignant neoplasm of left testis, unspecified whether descended or undescended: Secondary | ICD-10-CM

## 2024-02-29 DIAGNOSIS — C7802 Secondary malignant neoplasm of left lung: Secondary | ICD-10-CM

## 2024-02-29 DIAGNOSIS — Z800 Family history of malignant neoplasm of digestive organs: Secondary | ICD-10-CM

## 2024-02-29 DIAGNOSIS — E871 Hypo-osmolality and hyponatremia: Secondary | ICD-10-CM

## 2024-02-29 DIAGNOSIS — Z803 Family history of malignant neoplasm of breast: Secondary | ICD-10-CM

## 2024-02-29 DIAGNOSIS — S46912A Strain of unspecified muscle, fascia and tendon at shoulder and upper arm level, left arm, initial encounter: Secondary | ICD-10-CM

## 2024-02-29 DIAGNOSIS — D709 Neutropenia, unspecified: Secondary | ICD-10-CM

## 2024-02-29 DIAGNOSIS — M25512 Pain in left shoulder: Secondary | ICD-10-CM

## 2024-02-29 DIAGNOSIS — Z1379 Encounter for other screening for genetic and chromosomal anomalies: Secondary | ICD-10-CM

## 2024-02-29 DIAGNOSIS — D649 Anemia, unspecified: Secondary | ICD-10-CM

## 2024-02-29 DIAGNOSIS — K625 Hemorrhage of anus and rectum: Secondary | ICD-10-CM

## 2024-02-29 DIAGNOSIS — I8290 Acute embolism and thrombosis of unspecified vein: Secondary | ICD-10-CM

## 2024-02-29 DIAGNOSIS — C6291 Malignant neoplasm of right testis, unspecified whether descended or undescended: Secondary | ICD-10-CM

## 2024-02-29 DIAGNOSIS — R0789 Other chest pain: Secondary | ICD-10-CM

## 2024-02-29 NOTE — Telephone Encounter (Signed)
 Call received from Arland Clause at [facility] requesting to schedule a lab appt (CBC and CMET) and a hickman dressing change for pt on Wednesday, 03/09/24. Message sent to scheduling to add pt on at 11:00AM that day.  Arland is appreciative of assistance and has no further needs at this time.

## 2024-03-01 ENCOUNTER — Other Ambulatory Visit: Payer: Self-pay

## 2024-03-09 ENCOUNTER — Inpatient Hospital Stay: Attending: Hematology & Oncology

## 2024-03-09 ENCOUNTER — Inpatient Hospital Stay

## 2024-03-09 DIAGNOSIS — R319 Hematuria, unspecified: Secondary | ICD-10-CM

## 2024-03-09 DIAGNOSIS — E871 Hypo-osmolality and hyponatremia: Secondary | ICD-10-CM

## 2024-03-09 DIAGNOSIS — I440 Atrioventricular block, first degree: Secondary | ICD-10-CM

## 2024-03-09 DIAGNOSIS — Z803 Family history of malignant neoplasm of breast: Secondary | ICD-10-CM

## 2024-03-09 DIAGNOSIS — I8290 Acute embolism and thrombosis of unspecified vein: Secondary | ICD-10-CM

## 2024-03-09 DIAGNOSIS — C6291 Malignant neoplasm of right testis, unspecified whether descended or undescended: Secondary | ICD-10-CM | POA: Insufficient documentation

## 2024-03-09 DIAGNOSIS — D709 Neutropenia, unspecified: Secondary | ICD-10-CM

## 2024-03-09 DIAGNOSIS — D649 Anemia, unspecified: Secondary | ICD-10-CM

## 2024-03-09 DIAGNOSIS — R252 Cramp and spasm: Secondary | ICD-10-CM

## 2024-03-09 DIAGNOSIS — Z1379 Encounter for other screening for genetic and chromosomal anomalies: Secondary | ICD-10-CM

## 2024-03-09 DIAGNOSIS — C6292 Malignant neoplasm of left testis, unspecified whether descended or undescended: Secondary | ICD-10-CM

## 2024-03-09 DIAGNOSIS — S46912A Strain of unspecified muscle, fascia and tendon at shoulder and upper arm level, left arm, initial encounter: Secondary | ICD-10-CM

## 2024-03-09 DIAGNOSIS — K625 Hemorrhage of anus and rectum: Secondary | ICD-10-CM

## 2024-03-09 DIAGNOSIS — R1909 Other intra-abdominal and pelvic swelling, mass and lump: Secondary | ICD-10-CM

## 2024-03-09 DIAGNOSIS — G5793 Unspecified mononeuropathy of bilateral lower limbs: Secondary | ICD-10-CM

## 2024-03-09 DIAGNOSIS — M25512 Pain in left shoulder: Secondary | ICD-10-CM

## 2024-03-09 DIAGNOSIS — Z800 Family history of malignant neoplasm of digestive organs: Secondary | ICD-10-CM

## 2024-03-09 DIAGNOSIS — C7802 Secondary malignant neoplasm of left lung: Secondary | ICD-10-CM

## 2024-03-09 DIAGNOSIS — R0789 Other chest pain: Secondary | ICD-10-CM

## 2024-03-09 LAB — CMP (CANCER CENTER ONLY)
ALT: 22 U/L (ref 0–44)
AST: 27 U/L (ref 15–41)
Albumin: 4.2 g/dL (ref 3.5–5.0)
Alkaline Phosphatase: 79 U/L (ref 38–126)
Anion gap: 12 (ref 5–15)
BUN: 13 mg/dL (ref 6–20)
CO2: 24 mmol/L (ref 22–32)
Calcium: 9.1 mg/dL (ref 8.9–10.3)
Chloride: 104 mmol/L (ref 98–111)
Creatinine: 1.09 mg/dL (ref 0.61–1.24)
GFR, Estimated: >60 mL/min (ref >60)
Glucose, Bld: 86 mg/dL (ref 70–99)
Potassium: 4.4 mmol/L (ref 3.5–5.1)
Sodium: 139 mmol/L (ref 135–145)
Total Bilirubin: 0.3 mg/dL (ref 0.0–1.2)
Total Protein: 7.4 g/dL (ref 6.5–8.1)

## 2024-03-09 LAB — CBC WITH DIFFERENTIAL (CANCER CENTER ONLY)
Abs Immature Granulocytes: 0.01 K/uL (ref 0.00–0.07)
Basophils Absolute: 0 K/uL (ref 0.0–0.1)
Basophils Relative: 2 %
Eosinophils Absolute: 0 K/uL (ref 0.0–0.5)
Eosinophils Relative: 0 %
HCT: 27.1 % — ABNORMAL LOW (ref 39.0–52.0)
Hemoglobin: 9.1 g/dL — ABNORMAL LOW (ref 13.0–17.0)
Immature Granulocytes: 0 %
Lymphocytes Relative: 32 %
Lymphs Abs: 0.8 K/uL (ref 0.7–4.0)
MCH: 32.5 pg (ref 26.0–34.0)
MCHC: 33.6 g/dL (ref 30.0–36.0)
MCV: 96.8 fL (ref 80.0–100.0)
Monocytes Absolute: 0.6 K/uL (ref 0.1–1.0)
Monocytes Relative: 23 %
Neutro Abs: 1 K/uL — ABNORMAL LOW (ref 1.7–7.7)
Neutrophils Relative %: 43 %
Platelet Count: 194 K/uL (ref 150–400)
RBC: 2.8 MIL/uL — ABNORMAL LOW (ref 4.22–5.81)
RDW: 19.8 % — ABNORMAL HIGH (ref 11.5–15.5)
WBC Count: 2.4 K/uL — ABNORMAL LOW (ref 4.0–10.5)
nRBC: 0 % (ref 0.0–0.2)

## 2024-03-09 NOTE — Patient Instructions (Signed)
 Tunneled Catheter Insertion, Care After The following information offers guidance on how to care for yourself after your procedure. Your health care provider may also give you more specific instructions. If you have problems or questions, contact your health care provider. What can I expect after the procedure? After the procedure, it is common to have: Some mild redness, bruising, swelling, and pain around your catheter site. A small amount of blood or clear fluid coming from your incisions. Follow these instructions at home: Medicines Take over-the-counter and prescription medicines only as told by your health care provider. If you were prescribed an antibiotic medicine, take it as told by your health care provider. Do not stop taking the antibiotic even if you start to feel better. Incision care  Follow instructions from your health care provider about how to take care of your incisions. Make sure you: Wash your hands with soap and water for at least 20 seconds before and after you change your bandages (dressings). If soap and water are not available, use hand sanitizer. Change your dressings as told by your health care provider. Wash the area around your incisions with a germ-killing (antiseptic) solution when you change your dressings. Leave stitches (sutures), skin glue, or adhesive strips in place. These skin closures may need to stay in place for 2 weeks or longer. If adhesive strip edges start to loosen and curl up, you may trim the loose edges. Do not remove adhesive strips completely unless your health care provider tells you to do that. Keep your dressings clean and dry. Check your incision areas every day for signs of infection. Check for: More redness, swelling, or pain. More fluid or blood. Warmth. Pus or a bad smell. Catheter care  Wash your hands with soap and water for at least 20 seconds before and after caring for your catheter. If soap and water are not available, use  hand sanitizer. Keep your catheter site clean and dry. Apply an antibiotic ointment to your catheter site as told by your health care provider. Flush your catheter as told by your health care provider. This helps prevent it from becoming clogged. Leave thecaps on the ends of the catheter when not in use. Do not pull on your catheter. Activity Return to your normal activities as told by your health care provider. Ask your health care provider what activities are safe for you. Follow any other activity restrictions as instructed by your health care provider. Do not lift anything that is heavier than 10 lb (4.5 kg), or the limit that you are told, until your health care provider says that it is safe. Driving Do not drive until your health care provider approves. Ask your health care provider if the medicine prescribed to you requires you to avoid driving or using machinery. General instructions Follow your health care provider's specific instructions for the type of catheter that you have. Do not take baths, swim, or use a hot tub until your health care provider approves. Ask your health care provider if you may take showers. Keep all follow-up visits. This is important. Contact a health care provider if: You feel unusually weak or nauseous. You have a fever or chills. You have more redness, swelling, or pain at your incisions or around the area where your catheter has been inserted or where it exits. You have pus or a bad smell coming from your catheter site. Your catheter site feels warm to the touch. Your catheter is not working properly. Fluid is leaking from the  catheter, under the dressing, or around the dressing. You are unable to flush your catheter. Get help right away if: Your catheter develops a hole or it breaks. Your catheter comes loose or gets pulled completely out. If this happens, press on your catheter site firmly with a clean cloth until you can get medical help. You  develop bleeding from your catheter or your insertion site, and your bleeding does not stop. You have swelling in your shoulder, neck, chest, or face. You have pain or swelling when fluids or medicines are being given through the catheter. You have chest pain or difficulty breathing. These symptoms may represent a serious problem that is an emergency. Do not wait to see if the symptoms will go away. Get medical help right away. Call your local emergency services (911 in the [place] Do not drive yourself to the hospital. Summary After the procedure, it is common to have mild redness, swelling, and pain around your catheter site. Return to your normal activities as told by your health care provider. Ask your health care provider what activities are safe for you. Follow your health care provider's specific instructions for the type of catheter that you have. Keep your catheter site and your dressings clean and dry. Contact a health care provider if your catheter is not working properly. Get help right away if you have chest pain, difficulty breathing, or your catheter comes loose or gets pulled completely out. This information is not intended to replace advice given to you by your health care provider. Make sure you discuss any questions you have with your health care provider. Document Revised: 10/08/2020 Document Reviewed: 10/08/2020 Elsevier Patient Education  2024 [employer].

## 2024-03-14 ENCOUNTER — Other Ambulatory Visit: Payer: Self-pay

## 2024-03-21 ENCOUNTER — Other Ambulatory Visit: Payer: Self-pay | Admitting: Hematology & Oncology

## 2024-03-21 DIAGNOSIS — C801 Malignant (primary) neoplasm, unspecified: Secondary | ICD-10-CM

## 2024-03-21 DIAGNOSIS — C6291 Malignant neoplasm of right testis, unspecified whether descended or undescended: Secondary | ICD-10-CM

## 2024-03-21 DIAGNOSIS — G5793 Unspecified mononeuropathy of bilateral lower limbs: Secondary | ICD-10-CM

## 2024-04-08 ENCOUNTER — Other Ambulatory Visit: Payer: Self-pay

## 2024-04-14 ENCOUNTER — Inpatient Hospital Stay (HOSPITAL_BASED_OUTPATIENT_CLINIC_OR_DEPARTMENT_OTHER): Admitting: Hematology & Oncology

## 2024-04-14 ENCOUNTER — Encounter: Payer: Self-pay | Admitting: Hematology & Oncology

## 2024-04-14 ENCOUNTER — Inpatient Hospital Stay: Attending: Hematology & Oncology

## 2024-04-14 VITALS — BP 122/70 | HR 98 | Temp 98.1°F | Resp 16 | Wt 179.0 lb

## 2024-04-14 DIAGNOSIS — C7800 Secondary malignant neoplasm of unspecified lung: Secondary | ICD-10-CM | POA: Diagnosis not present

## 2024-04-14 DIAGNOSIS — C6292 Malignant neoplasm of left testis, unspecified whether descended or undescended: Secondary | ICD-10-CM

## 2024-04-14 DIAGNOSIS — R978 Other abnormal tumor markers: Secondary | ICD-10-CM | POA: Diagnosis not present

## 2024-04-14 DIAGNOSIS — C6291 Malignant neoplasm of right testis, unspecified whether descended or undescended: Secondary | ICD-10-CM | POA: Diagnosis present

## 2024-04-14 LAB — CBC WITH DIFFERENTIAL (CANCER CENTER ONLY)
Abs Immature Granulocytes: 0.1 K/uL — ABNORMAL HIGH (ref 0.00–0.07)
Basophils Absolute: 0 K/uL (ref 0.0–0.1)
Basophils Relative: 1 %
Eosinophils Absolute: 0 K/uL (ref 0.0–0.5)
Eosinophils Relative: 0 %
HCT: 29.1 % — ABNORMAL LOW (ref 39.0–52.0)
Hemoglobin: 9.8 g/dL — ABNORMAL LOW (ref 13.0–17.0)
Lymphocytes Relative: 28 %
Lymphs Abs: 0.8 K/uL (ref 0.7–4.0)
MCH: 34.4 pg — ABNORMAL HIGH (ref 26.0–34.0)
MCHC: 33.7 g/dL (ref 30.0–36.0)
MCV: 102.1 fL — ABNORMAL HIGH (ref 80.0–100.0)
Monocytes Absolute: 0.7 K/uL (ref 0.1–1.0)
Monocytes Relative: 23 %
Myelocytes: 5 %
Neutro Abs: 1.2 K/uL — ABNORMAL LOW (ref 1.7–7.7)
Neutrophils Relative %: 43 %
Platelet Count: 141 K/uL — ABNORMAL LOW (ref 150–400)
RBC: 2.85 MIL/uL — ABNORMAL LOW (ref 4.22–5.81)
RDW: 14.1 % (ref 11.5–15.5)
Smear Review: NORMAL
WBC Count: 2.9 K/uL — ABNORMAL LOW (ref 4.0–10.5)
nRBC: 0 % (ref 0.0–0.2)

## 2024-04-14 LAB — CMP (CANCER CENTER ONLY)
ALT: 20 U/L (ref 0–44)
AST: 24 U/L (ref 15–41)
Albumin: 4.5 g/dL (ref 3.5–5.0)
Alkaline Phosphatase: 90 U/L (ref 38–126)
Anion gap: 11 (ref 5–15)
BUN: 25 mg/dL — ABNORMAL HIGH (ref 6–20)
CO2: 26 mmol/L (ref 22–32)
Calcium: 9.8 mg/dL (ref 8.9–10.3)
Chloride: 100 mmol/L (ref 98–111)
Creatinine: 1.05 mg/dL (ref 0.61–1.24)
GFR, Estimated: >60 mL/min (ref >60)
Glucose, Bld: 102 mg/dL — ABNORMAL HIGH (ref 70–99)
Potassium: 4.7 mmol/L (ref 3.5–5.1)
Sodium: 137 mmol/L (ref 135–145)
Total Bilirubin: 0.3 mg/dL (ref 0.0–1.2)
Total Protein: 7.7 g/dL (ref 6.5–8.1)

## 2024-04-14 LAB — LACTATE DEHYDROGENASE: LDH: 277 U/L — ABNORMAL HIGH (ref 98–192)

## 2024-04-14 NOTE — Progress Notes (Signed)
 Hematology and Oncology Follow Up Visit  Ren Aspinall [id number] April 16, 1996 28 y.o. 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 Mccorkel is here today for follow-up.  He finally got finished up at [facility].  He had a double transplant for the recurrence of his testicular cancer.  Hopefully, he is in remission now.  We will set him up with some scans in about 2 or 3 weeks to see everything looks.  He had a little bit of a tough time with treatment.  Family, I do not think he had any problems with infections.  He has lost a little bit of weight.  Has not been able to work since January.  I know this has been a real problem for him.  Hopefully, he will be able to get back to work a little bit.  His white cell count is low but coming up so I think that it would be okay for him to go back to work.  He has had no change in bowel or bladder habits.  He has had no issues with nausea or vomiting.  He has had no cough or shortness of breath.  He has had no leg swelling.  He has had no rashes.  He has had no bleeding.  There is been no visual changes.  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  He developed a right gonadal vein thrombus.  Hopefully, this is resolved.  Overall, I would have to say that his performance status is probably ECOG 1.    Medications:  Allergies as of  04/14/2024       Reactions   Bee Venom Anaphylaxis   Other Anaphylaxis, Swelling   Bee allergy        Medication List        Accurate as of April 14, 2024  9:18 AM. If you have any questions, ask your nurse or doctor.          apixaban  2.5 MG Tabs tablet Commonly known as: ELIQUIS  Take 1 tablet (2.5 mg total) by mouth 2 (two) times daily.   ciprofloxacin  500 MG tablet Commonly known as: Cipro  Take 1 tablet (500 mg total) by mouth 2 (two) times daily.   doxycycline  100 MG tablet Commonly known as: VIBRA -TABS Take 1 tablet (100 mg total) by mouth 2 (two) times daily.   gabapentin  300 MG capsule Commonly known as: NEURONTIN  TAKE 1 CAPSULE BY MOUTH EVERYDAY AT BEDTIME   losartan  25 MG tablet Commonly known as: COZAAR  Take 12.5 mg by mouth daily.   multivitamin tablet Take 1 tablet by mouth in the morning.   Nivestym 480 MCG/0.8ML Sosy injection Generic drug: filgrastim-aafi Inject 960 mcg into the skin.   OLANZapine  10 MG tablet Commonly  known as: ZYPREXA  Take 1 tablet (10 mg total) by mouth at bedtime.   senna-docusate 8.6-50 MG tablet Commonly known as: Senokot-S Take 1 tablet by mouth 2 (two) times daily. While taking strong pain meds to prevent constipation.   testosterone cypionate 200 MG/ML injection Commonly known as: DEPOTESTOSTERONE CYPIONATE Inject 100 mg into the muscle every Sunday.   ursodiol 300 MG capsule Commonly known as: ACTIGALL Take 1 capsule by mouth three times a day with food.        Allergies:  Allergies  Allergen Reactions   Bee Venom Anaphylaxis   Other Anaphylaxis and Swelling    Bee allergy    Past Medical History, Surgical history, Social history, and Family History were reviewed and updated.  Review of Systems: Review of Systems  Constitutional: Negative.   HENT: Negative.    Eyes: Negative.   Respiratory: Negative.    Cardiovascular: Negative.   Gastrointestinal: Negative.   Genitourinary: Negative.    Musculoskeletal: Negative.   Skin: Negative.   Neurological: Negative.   Endo/Heme/Allergies: Negative.   Psychiatric/Behavioral: Negative.       Physical Exam:  weight is 179 lb (81.2 kg). His oral temperature is 98.1 F (36.7 C). His blood pressure is 122/70 and his pulse is 98. His respiration is 16 and oxygen saturation is 100%.   Wt Readings from Last 3 Encounters:  04/14/24 179 lb (81.2 kg)  01/18/24 206 lb (93.4 kg)  12/21/23 198 lb (89.8 kg)    Physical Exam Vitals reviewed.  HENT:     Head: Normocephalic and atraumatic.  Eyes:     Pupils: Pupils are equal, round, and reactive to light.  Cardiovascular:     Rate and Rhythm: Normal rate and regular rhythm.     Heart sounds: Normal heart sounds.  Pulmonary:     Effort: Pulmonary effort is normal.     Breath sounds: Normal breath sounds.  Abdominal:     General: Bowel sounds are normal.     Palpations: Abdomen is soft.  Musculoskeletal:        General: No tenderness or deformity. Normal range of motion.     Cervical back: Normal range of motion.  Lymphadenopathy:     Cervical: No cervical adenopathy.  Skin:    General: Skin is warm and dry.     Findings: No erythema or rash.  Neurological:     Mental Status: He is alert and oriented to person, place, and time.  Psychiatric:        Behavior: Behavior normal.        Thought Content: Thought content normal.        Judgment: Judgment normal.      Lab Results  Component Value Date   WBC 2.9 (L) 04/14/2024   HGB 9.8 (L) 04/14/2024   HCT 29.1 (L) 04/14/2024   MCV 102.1 (H) 04/14/2024   PLT 141 (L) 04/14/2024   No results found for: FERRITIN, IRON, TIBC, UIBC, IRONPCTSAT Lab Results  Component Value Date   RBC 2.85 (L) 04/14/2024   No results found for: KPAFRELGTCHN, LAMBDASER, KAPLAMBRATIO No results found for: IGGSERUM, IGA, IGMSERUM No results found for: TOTALPROTELP, ALBUMINELP, A1GS, A2GS, BETS, BETA2SER, GAMS,  MSPIKE, SPEI   Chemistry      Component Value Date/Time   NA 137 04/14/2024 0830   NA 141 10/29/2022 0931   K 4.7 04/14/2024 0830   CL 100 04/14/2024 0830   CO2 26 04/14/2024 0830   BUN 25 (H) 04/14/2024 0830  BUN 18 10/29/2022 0931   CREATININE 1.05 04/14/2024 0830      Component Value Date/Time   CALCIUM 9.8 04/14/2024 0830   ALKPHOS 90 04/14/2024 0830   AST 24 04/14/2024 0830   ALT 20 04/14/2024 0830   BILITOT 0.3 04/14/2024 0830       Impression and Plan: Mr. Zwiefelhofer is a 28 yo caucasian gentleman with metastatic embryonal cell carcinoma.  He had a left orchiectomy back on July 03, 2017.  A week later, he showed up in the emergency room with chest discomfort and some shortness of breath and had innumerable pulmonary nodules.  He was treated with systemic chemotherapy.  He had a complete response.   He then developed a second testicular cancer in the right testicle.  This was a pure seminoma, early-stage.   The seminoma then relapsed.  He had 3 cycles of chemotherapy with BEP.  He had an initial response and then relapsed again.  We did give him chemotherapy withTIP.  He did well with this.  He did have a response.  He then underwent double transplant at [facility].  I think he had his second transplant on 03/28/2024.  Will be incredibly interesting to see what the CT scan shows.  I will be incredibly interesting to see what the tumor markers are.  His tumor marker that I has always been elevated was the alpha-fetoprotein.  For right now, we will plan to get him back after Thanksgiving.  We will get the CT scan before he goes to see his doctors at [facility].  He has an appointment at [facility] on 05/05/2024.  I really am happy that he looks as good as he does.  I know this has been an incredible struggle and challenge for him.  Thankfully he has been in great shape.   Maude JONELLE Crease, MD 10/30/20259:18 AM

## 2024-04-15 ENCOUNTER — Ambulatory Visit: Payer: Self-pay | Admitting: Hematology & Oncology

## 2024-04-15 ENCOUNTER — Other Ambulatory Visit: Payer: Self-pay

## 2024-04-15 LAB — URINE CULTURE: Culture: NO GROWTH

## 2024-04-15 LAB — AFP TUMOR MARKER: AFP, Serum, Tumor Marker: 8.8 ng/mL — ABNORMAL HIGH (ref 0.0–5.7)

## 2024-04-15 LAB — BETA HCG QUANT (REF LAB): hCG Quant: <1 m[IU]/mL (ref 0–3)

## 2024-04-18 ENCOUNTER — Encounter: Payer: Self-pay | Admitting: *Deleted

## 2024-04-18 NOTE — Progress Notes (Signed)
 Patient was seen last week for follow up after his tandem transplants. He had fair tolerance of the transplants. He needs a follow up CT scheduled for 05/02/2024.   Oncology Nurse Navigator Documentation     04/18/2024   10:00 AM  Oncology Nurse Navigator Flowsheets  Navigator Follow Up Date: 05/02/2024  Navigator Follow Up Reason: Scan Review  Navigator Location [facility]  Navigator Encounter Type Appt/Treatment Plan Review  Patient Visit Type MedOnc  Treatment Phase Post-Tx Follow-up  Barriers/Navigation Needs No Barriers At This Time  Interventions None Required  Acuity Level 1-No Barriers  Support Groups/Services Friends and Family  Time Spent with Patient 15

## 2024-04-21 ENCOUNTER — Other Ambulatory Visit: Payer: Self-pay

## 2024-05-02 ENCOUNTER — Encounter (HOSPITAL_BASED_OUTPATIENT_CLINIC_OR_DEPARTMENT_OTHER): Payer: Self-pay

## 2024-05-02 ENCOUNTER — Ambulatory Visit: Payer: Self-pay | Admitting: Hematology & Oncology

## 2024-05-02 ENCOUNTER — Ambulatory Visit (HOSPITAL_BASED_OUTPATIENT_CLINIC_OR_DEPARTMENT_OTHER)
Admission: RE | Admit: 2024-05-02 | Discharge: 2024-05-02 | Disposition: A | Discharge status: Home / Self Care | Source: Ambulatory Visit | Attending: Hematology & Oncology | Admitting: Hematology & Oncology

## 2024-05-02 DIAGNOSIS — C6292 Malignant neoplasm of left testis, unspecified whether descended or undescended: Secondary | ICD-10-CM | POA: Insufficient documentation

## 2024-05-02 MED ORDER — IOHEXOL 300 MG/ML  SOLN
100.0000 mL | Freq: Once | INTRAMUSCULAR | Status: AC | PRN
  Administered 2024-05-02: 100 mL via INTRAVENOUS

## 2024-05-03 ENCOUNTER — Encounter: Payer: Self-pay | Admitting: *Deleted

## 2024-05-03 NOTE — Progress Notes (Signed)
 Reviewed CT which shows treatment response.   Oncology Nurse Navigator Documentation     05/03/2024    7:30 AM  Oncology Nurse Navigator Flowsheets  Navigator Follow Up Date: 05/20/2024  Navigator Follow Up Reason: Follow-up Appointment  Navigator Location [facility]  Navigator Encounter Type Scan Review  Patient Visit Type MedOnc  Treatment Phase Post-Tx Follow-up  Barriers/Navigation Needs No Barriers At This Time  Interventions None Required  Acuity Level 1-No Barriers  Support Groups/Services Friends and Family  Time Spent with Patient 15

## 2024-05-20 ENCOUNTER — Inpatient Hospital Stay: Admitting: Hematology & Oncology

## 2024-05-20 ENCOUNTER — Other Ambulatory Visit: Payer: Self-pay

## 2024-05-20 ENCOUNTER — Inpatient Hospital Stay: Attending: Hematology & Oncology

## 2024-05-20 ENCOUNTER — Inpatient Hospital Stay

## 2024-05-20 ENCOUNTER — Encounter: Payer: Self-pay | Admitting: Hematology & Oncology

## 2024-05-20 ENCOUNTER — Encounter: Payer: Self-pay | Admitting: *Deleted

## 2024-05-20 VITALS — BP 147/79 | HR 82 | Temp 98.4°F | Resp 18 | Ht 73.0 in | Wt 190.0 lb

## 2024-05-20 DIAGNOSIS — C7802 Secondary malignant neoplasm of left lung: Secondary | ICD-10-CM

## 2024-05-20 DIAGNOSIS — C6292 Malignant neoplasm of left testis, unspecified whether descended or undescended: Secondary | ICD-10-CM

## 2024-05-20 DIAGNOSIS — C801 Malignant (primary) neoplasm, unspecified: Secondary | ICD-10-CM

## 2024-05-20 DIAGNOSIS — C7989 Secondary malignant neoplasm of other specified sites: Secondary | ICD-10-CM | POA: Diagnosis not present

## 2024-05-20 DIAGNOSIS — C6291 Malignant neoplasm of right testis, unspecified whether descended or undescended: Secondary | ICD-10-CM | POA: Insufficient documentation

## 2024-05-20 DIAGNOSIS — C7800 Secondary malignant neoplasm of unspecified lung: Secondary | ICD-10-CM | POA: Diagnosis not present

## 2024-05-20 DIAGNOSIS — R252 Cramp and spasm: Secondary | ICD-10-CM

## 2024-05-20 DIAGNOSIS — Z9484 Stem cells transplant status: Secondary | ICD-10-CM | POA: Insufficient documentation

## 2024-05-20 LAB — CBC WITH DIFFERENTIAL (CANCER CENTER ONLY)
Abs Immature Granulocytes: 0.01 K/uL (ref 0.00–0.07)
Basophils Absolute: 0 K/uL (ref 0.0–0.1)
Basophils Relative: 1 %
Eosinophils Absolute: 0.4 K/uL (ref 0.0–0.5)
Eosinophils Relative: 10 %
HCT: 42.6 % (ref 39.0–52.0)
Hemoglobin: 14.3 g/dL (ref 13.0–17.0)
Immature Granulocytes: 0 %
Lymphocytes Relative: 17 %
Lymphs Abs: 0.8 K/uL (ref 0.7–4.0)
MCH: 34.9 pg — ABNORMAL HIGH (ref 26.0–34.0)
MCHC: 33.6 g/dL (ref 30.0–36.0)
MCV: 103.9 fL — ABNORMAL HIGH (ref 80.0–100.0)
Monocytes Absolute: 0.4 K/uL (ref 0.1–1.0)
Monocytes Relative: 10 %
Neutro Abs: 2.7 K/uL (ref 1.7–7.7)
Neutrophils Relative %: 62 %
Platelet Count: 167 K/uL (ref 150–400)
RBC: 4.1 MIL/uL — ABNORMAL LOW (ref 4.22–5.81)
RDW: 12.1 % (ref 11.5–15.5)
WBC Count: 4.4 K/uL (ref 4.0–10.5)
nRBC: 0 % (ref 0.0–0.2)

## 2024-05-20 LAB — CMP (CANCER CENTER ONLY)
ALT: 43 U/L (ref 0–44)
AST: 39 U/L (ref 15–41)
Albumin: 4.7 g/dL (ref 3.5–5.0)
Alkaline Phosphatase: 86 U/L (ref 38–126)
Anion gap: 10 (ref 5–15)
BUN: 16 mg/dL (ref 6–20)
CO2: 29 mmol/L (ref 22–32)
Calcium: 9.8 mg/dL (ref 8.9–10.3)
Chloride: 102 mmol/L (ref 98–111)
Creatinine: 1.15 mg/dL (ref 0.61–1.24)
GFR, Estimated: >60 mL/min (ref >60)
Glucose, Bld: 96 mg/dL (ref 70–99)
Potassium: 4.1 mmol/L (ref 3.5–5.1)
Sodium: 141 mmol/L (ref 135–145)
Total Bilirubin: 0.4 mg/dL (ref 0.0–1.2)
Total Protein: 7.7 g/dL (ref 6.5–8.1)

## 2024-05-20 LAB — LACTATE DEHYDROGENASE: LDH: 259 U/L — ABNORMAL HIGH (ref 105–235)

## 2024-05-20 LAB — MAGNESIUM: Magnesium: 2 mg/dL (ref 1.7–2.4)

## 2024-05-20 NOTE — Progress Notes (Signed)
 Patient is doing well post transplant and will now be followed on observation. Patient has not had navigational needs for some time, so active navigation will be discontinued but will be available to the patient as needed in the future.   Oncology Nurse Navigator Documentation     05/20/2024    9:45 AM  Oncology Nurse Navigator Flowsheets  Navigation Complete Date: 05/20/2024  Post Navigation: Continue to Follow Patient? No  Reason Not Navigating Patient: No Treatment, Observation Only  Navigator Location [facility]  Navigator Encounter Type Follow-up Appt  Patient Visit Type MedOnc  Treatment Phase Post-Tx Follow-up  Barriers/Navigation Needs No Barriers At This Time  Interventions None Required  Acuity Level 1-No Barriers  Time Spent with Patient 15

## 2024-05-20 NOTE — Progress Notes (Signed)
 Hematology and Oncology Follow Up Visit  Daniel Shepherd [id number] 05-11-1996 28 y.o. 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  He had his double transplant.  He made it through this.  He is looking better which is nice to see.  He has been followed closely at [facility].  I think his next appointment is in early January.  He is back working.  He is exercising.  He certainly has had quite a lot of reserve.  He has had no problems with chest pain.  He has had no cough.  He has had no change in bowel or bladder habits.  He has a little bit of hip pain bilaterally.  I am sure this is probably result of the transplant that he had.  When we last saw him, his alpha-fetoprotein was still on the higher side at 8.8.  The beta-hCG was less than 1.  He will be due for another CT scan probably in about 2 or 3 weeks.  His appetite has been good.  He had a very busy Thanksgiving.  Of note, he is off Eliquis  now.  He was taken off this by [facility] during his transplant.  Overall, I would have to say that his performance status is probably ECOG 0.    Medications:  Allergies as of 05/20/2024       Reactions   Bee Venom Anaphylaxis   Other Anaphylaxis, Swelling   Bee allergy        Medication List        Accurate as of May 20, 2024 10:58 AM. If you have any questions, ask your nurse or doctor.           apixaban  2.5 MG Tabs tablet Commonly known as: ELIQUIS  Take 1 tablet (2.5 mg total) by mouth 2 (two) times daily.   ciprofloxacin  500 MG tablet Commonly known as: Cipro  Take 1 tablet (500 mg total) by mouth 2 (two) times daily.   doxycycline  100 MG tablet Commonly known as: VIBRA -TABS Take 1 tablet (100 mg total) by mouth 2 (two) times daily.   fluconazole 200 MG tablet Commonly known as: DIFLUCAN Take 200 mg by mouth daily.   gabapentin  300 MG capsule Commonly known as: NEURONTIN  TAKE 1 CAPSULE BY MOUTH EVERYDAY AT BEDTIME   losartan  25 MG tablet Commonly known as: COZAAR  Take 12.5 mg by mouth daily.   multivitamin tablet Take 1 tablet by mouth in the morning.   Nivestym 480 MCG/0.8ML Sosy injection Generic drug: filgrastim-aafi Inject 960 mcg into the skin.   OLANZapine  10 MG tablet Commonly known as: ZYPREXA  Take 1 tablet (10 mg total) by mouth at bedtime.   senna-docusate 8.6-50 MG tablet Commonly known as: Senokot-S Take 1 tablet by mouth 2 (two) times daily. While taking  strong pain meds to prevent constipation.   testosterone cypionate 200 MG/ML injection Commonly known as: DEPOTESTOSTERONE CYPIONATE Inject 100 mg into the muscle every Sunday.   ursodiol 300 MG capsule Commonly known as: ACTIGALL Take 1 capsule by mouth three times a day with food.        Allergies:  Allergies  Allergen Reactions   Bee Venom Anaphylaxis   Other Anaphylaxis and Swelling    Bee allergy    Past Medical History, Surgical history, Social history, and Family History were reviewed and updated.  Review of Systems: Review of Systems  Constitutional: Negative.   HENT: Negative.    Eyes: Negative.   Respiratory: Negative.    Cardiovascular: Negative.   Gastrointestinal: Negative.   Genitourinary: Negative.   Musculoskeletal: Negative.   Skin: Negative.   Neurological: Negative.   Endo/Heme/Allergies: Negative.   Psychiatric/Behavioral: Negative.        Physical Exam:  height is 6' 1 (1.854 m) and weight is 190 lb (86.2 kg). His oral temperature is 98.4 F (36.9 C). His blood pressure is 133/88 (pended) and his pulse is 82. His respiration is 18 and oxygen saturation is 100%.   Wt Readings from Last 3 Encounters:  05/20/24 190 lb (86.2 kg)  04/14/24 179 lb (81.2 kg)  01/18/24 206 lb (93.4 kg)    Physical Exam Vitals reviewed.  HENT:     Head: Normocephalic and atraumatic.  Eyes:     Pupils: Pupils are equal, round, and reactive to light.  Cardiovascular:     Rate and Rhythm: Normal rate and regular rhythm.     Heart sounds: Normal heart sounds.  Pulmonary:     Effort: Pulmonary effort is normal.     Breath sounds: Normal breath sounds.  Abdominal:     General: Bowel sounds are normal.     Palpations: Abdomen is soft.  Musculoskeletal:        General: No tenderness or deformity. Normal range of motion.     Cervical back: Normal range of motion.  Lymphadenopathy:     Cervical: No cervical adenopathy.  Skin:    General: Skin is warm and dry.     Findings: No erythema or rash.  Neurological:     Mental Status: He is alert and oriented to person, place, and time.  Psychiatric:        Behavior: Behavior normal.        Thought Content: Thought content normal.        Judgment: Judgment normal.      Lab Results  Component Value Date   WBC 4.4 05/20/2024   HGB 14.3 05/20/2024   HCT 42.6 05/20/2024   MCV 103.9 (H) 05/20/2024   PLT 167 05/20/2024   No results found for: FERRITIN, IRON, TIBC, UIBC, IRONPCTSAT Lab Results  Component Value Date   RBC 4.10 (L) 05/20/2024   No results found for: KPAFRELGTCHN, LAMBDASER, KAPLAMBRATIO No results found for: IGGSERUM, IGA, IGMSERUM No results found for: STEPHANY CARLOTA BENSON MARKEL EARLA JOANNIE DOC VICK, SPEI   Chemistry      Component Value Date/Time   NA 141 05/20/2024 0918   NA 141 10/29/2022 0931   K 4.1  05/20/2024 0918   CL 102 05/20/2024 0918   CO2 29 05/20/2024 0918   BUN 16 05/20/2024 0918   BUN 18 10/29/2022 0931   CREATININE 1.15 05/20/2024 0918      Component Value Date/Time   CALCIUM 9.8 05/20/2024 0918   ALKPHOS 86 05/20/2024 0918  AST 39 05/20/2024 0918   ALT 43 05/20/2024 0918   BILITOT 0.4 05/20/2024 0918       Impression and Plan: Mr. Coldren is a 28 yo caucasian gentleman with metastatic embryonal cell carcinoma.  He had a left orchiectomy back on July 03, 2017.  A week later, he showed up in the emergency room with chest discomfort and some shortness of breath and had innumerable pulmonary nodules.  He was treated with systemic chemotherapy.  He had a complete response.   He then developed a second testicular cancer in the right testicle.  This was a pure seminoma, early-stage.   The seminoma then relapsed.  He had 3 cycles of chemotherapy with BEP.  He had an initial response and then relapsed again.  We did give him chemotherapy withTIP.  He did well with this.  He did have a response.  He then underwent double transplant at [facility].  I think he had his second transplant on 03/28/2024.  Again, we have to set him up with a CT scan.  I will set this up the last week of December.  We will see what his tumor marker show today.  I am glad that he is doing well.  He has recovered incredibly quickly from the transplant.  I would like to see him back in early January, before he heads back to [facility] for another evaluation.   Maude JONELLE Crease, MD 12/5/202510:58 AM

## 2024-05-21 LAB — BETA HCG QUANT (REF LAB): hCG Quant: <1 m[IU]/mL (ref 0–3)

## 2024-05-21 LAB — AFP TUMOR MARKER: AFP, Serum, Tumor Marker: 6.3 ng/mL — ABNORMAL HIGH (ref 0.0–5.7)

## 2024-05-22 ENCOUNTER — Other Ambulatory Visit: Payer: Self-pay

## 2024-05-23 ENCOUNTER — Encounter: Payer: Self-pay | Admitting: *Deleted

## 2024-05-30 ENCOUNTER — Ambulatory Visit (HOSPITAL_BASED_OUTPATIENT_CLINIC_OR_DEPARTMENT_OTHER): Admission: RE | Admit: 2024-05-30

## 2024-05-30 ENCOUNTER — Encounter (HOSPITAL_BASED_OUTPATIENT_CLINIC_OR_DEPARTMENT_OTHER): Payer: Self-pay

## 2024-05-30 ENCOUNTER — Ambulatory Visit: Payer: Self-pay | Admitting: Hematology & Oncology

## 2024-05-30 ENCOUNTER — Ambulatory Visit (HOSPITAL_BASED_OUTPATIENT_CLINIC_OR_DEPARTMENT_OTHER)
Admission: RE | Admit: 2024-05-30 | Discharge: 2024-05-30 | Disposition: A | Source: Ambulatory Visit | Attending: Hematology & Oncology | Admitting: Hematology & Oncology

## 2024-05-30 DIAGNOSIS — C6292 Malignant neoplasm of left testis, unspecified whether descended or undescended: Secondary | ICD-10-CM | POA: Diagnosis present

## 2024-05-30 DIAGNOSIS — C801 Malignant (primary) neoplasm, unspecified: Secondary | ICD-10-CM | POA: Insufficient documentation

## 2024-05-30 DIAGNOSIS — C7802 Secondary malignant neoplasm of left lung: Secondary | ICD-10-CM | POA: Diagnosis present

## 2024-05-30 MED ORDER — IOHEXOL 300 MG/ML  SOLN
100.0000 mL | Freq: Once | INTRAMUSCULAR | Status: AC | PRN
  Administered 2024-05-30: 11:00:00 100 mL via INTRAVENOUS

## 2024-06-16 ENCOUNTER — Encounter: Payer: Self-pay | Admitting: Hematology & Oncology

## 2024-06-22 ENCOUNTER — Inpatient Hospital Stay: Attending: Hematology & Oncology

## 2024-06-22 ENCOUNTER — Inpatient Hospital Stay: Admitting: Hematology & Oncology

## 2024-06-27 ENCOUNTER — Other Ambulatory Visit: Payer: Self-pay

## 2024-07-01 ENCOUNTER — Other Ambulatory Visit: Payer: Self-pay
